# Patient Record
Sex: Female | Born: 1945 | Race: White | Hispanic: No | Marital: Single | Smoking: Never smoker
Health system: Southern US, Community
[De-identification: ages and names within clinical notes are randomized; demographics above are authoritative.]

## PROBLEM LIST (undated history)

## (undated) DIAGNOSIS — R0902 Hypoxemia: Secondary | ICD-10-CM

## (undated) DIAGNOSIS — G8929 Other chronic pain: Secondary | ICD-10-CM

## (undated) DIAGNOSIS — E041 Nontoxic single thyroid nodule: Secondary | ICD-10-CM

## (undated) DIAGNOSIS — K219 Gastro-esophageal reflux disease without esophagitis: Secondary | ICD-10-CM

## (undated) DIAGNOSIS — E873 Alkalosis: Secondary | ICD-10-CM

## (undated) DIAGNOSIS — J189 Pneumonia, unspecified organism: Secondary | ICD-10-CM

## (undated) DIAGNOSIS — J449 Chronic obstructive pulmonary disease, unspecified: Secondary | ICD-10-CM

## (undated) DIAGNOSIS — I5031 Acute diastolic (congestive) heart failure: Secondary | ICD-10-CM

## (undated) DIAGNOSIS — R911 Solitary pulmonary nodule: Secondary | ICD-10-CM

## (undated) DIAGNOSIS — I10 Essential (primary) hypertension: Secondary | ICD-10-CM

## (undated) DIAGNOSIS — G7100 Muscular dystrophy, unspecified: Secondary | ICD-10-CM

## (undated) DIAGNOSIS — J9811 Atelectasis: Secondary | ICD-10-CM

## (undated) DIAGNOSIS — E871 Hypo-osmolality and hyponatremia: Secondary | ICD-10-CM

## (undated) DIAGNOSIS — I509 Heart failure, unspecified: Secondary | ICD-10-CM

## (undated) DIAGNOSIS — J9612 Chronic respiratory failure with hypercapnia: Secondary | ICD-10-CM

## (undated) DIAGNOSIS — M6281 Muscle weakness (generalized): Secondary | ICD-10-CM

## (undated) HISTORY — DX: Gastro-esophageal reflux disease without esophagitis: K21.9

## (undated) HISTORY — PX: ABDOMINAL HYSTERECTOMY: SHX81

## (undated) HISTORY — PX: CHOLECYSTECTOMY: SHX55

---

## 2010-10-06 ENCOUNTER — Ambulatory Visit (HOSPITAL_COMMUNITY)
Admission: RE | Admit: 2010-10-06 | Discharge: 2010-10-06 | Disposition: A | Discharge status: Home / Self Care | Payer: Medicare Other | Source: Ambulatory Visit | Attending: Pulmonary Disease | Admitting: Pulmonary Disease

## 2010-10-06 ENCOUNTER — Other Ambulatory Visit (HOSPITAL_COMMUNITY): Payer: Self-pay | Admitting: Pulmonary Disease

## 2010-10-06 ENCOUNTER — Other Ambulatory Visit (HOSPITAL_COMMUNITY): Payer: Self-pay | Admitting: *Deleted

## 2010-10-06 DIAGNOSIS — R0602 Shortness of breath: Secondary | ICD-10-CM

## 2011-01-19 ENCOUNTER — Ambulatory Visit (HOSPITAL_COMMUNITY)
Admission: RE | Admit: 2011-01-19 | Discharge: 2011-01-19 | Disposition: A | Discharge status: Home / Self Care | Payer: Medicare Other | Source: Ambulatory Visit | Attending: Pulmonary Disease | Admitting: Pulmonary Disease

## 2011-01-19 ENCOUNTER — Other Ambulatory Visit (HOSPITAL_COMMUNITY): Payer: Self-pay | Admitting: Pulmonary Disease

## 2011-01-19 DIAGNOSIS — G35 Multiple sclerosis: Secondary | ICD-10-CM | POA: Insufficient documentation

## 2011-01-19 DIAGNOSIS — R52 Pain, unspecified: Secondary | ICD-10-CM

## 2011-01-19 DIAGNOSIS — M542 Cervicalgia: Secondary | ICD-10-CM | POA: Insufficient documentation

## 2011-06-22 ENCOUNTER — Other Ambulatory Visit: Payer: Self-pay | Admitting: Neurology

## 2011-06-22 ENCOUNTER — Ambulatory Visit (HOSPITAL_COMMUNITY)
Admission: RE | Admit: 2011-06-22 | Discharge: 2011-06-22 | Disposition: A | Discharge status: Home / Self Care | Payer: Medicare Other | Source: Ambulatory Visit | Attending: Neurology | Admitting: Neurology

## 2011-06-22 DIAGNOSIS — W19XXXA Unspecified fall, initial encounter: Secondary | ICD-10-CM

## 2011-06-22 DIAGNOSIS — M25569 Pain in unspecified knee: Secondary | ICD-10-CM | POA: Insufficient documentation

## 2011-06-22 DIAGNOSIS — M899 Disorder of bone, unspecified: Secondary | ICD-10-CM | POA: Insufficient documentation

## 2011-06-22 DIAGNOSIS — M79609 Pain in unspecified limb: Secondary | ICD-10-CM | POA: Insufficient documentation

## 2011-06-22 DIAGNOSIS — M949 Disorder of cartilage, unspecified: Secondary | ICD-10-CM | POA: Insufficient documentation

## 2011-07-04 ENCOUNTER — Ambulatory Visit: Payer: Medicare Other | Attending: Neurology | Admitting: Sleep Medicine

## 2011-07-04 DIAGNOSIS — Z6834 Body mass index (BMI) 34.0-34.9, adult: Secondary | ICD-10-CM | POA: Insufficient documentation

## 2011-07-04 DIAGNOSIS — G4710 Hypersomnia, unspecified: Secondary | ICD-10-CM | POA: Insufficient documentation

## 2011-07-04 DIAGNOSIS — G4730 Sleep apnea, unspecified: Secondary | ICD-10-CM

## 2011-07-10 NOTE — Procedures (Signed)
Karen Collier, KIGER                 ACCOUNT NO.:  [id number]  MEDICAL RECORD NO.:  [id number]          PATIENT TYPE:  OUT  LOCATION:  SLEEP LAB                     FACILITY:  [facility]  PHYSICIAN:  Almus Woodham A. Gerilyn Pilgrim, M.D. DATE OF BIRTH:  01/03/1946  DATE OF STUDY:  07/04/2011                           NOCTURNAL POLYSOMNOGRAM   REFERRING PHYSICIAN:  Asa Fath A. Lamiracle Chaidez, MD  INDICATION FOR STUDY:  A 66 year old presents with hypersomnia, fatigue, and snoring.  The study is being done to evaluate for obstructive sleep apnea syndrome.  EPWORTH SLEEPINESS SCORE:  Twelve.  BMI 34.  MEDICATIONS: 1. Coreg. 2. Diazepam. 3. Metolazone. 4. Lasix. 5. Meclizine. 6. Ventolin. 7. Soma. 8. Tramadol. 9. Calcium. 10.Multivitamin. 11.Tylenol Extra Strength.  SLEEP ARCHITECTURE:  The total recording time is 401 minutes.  Sleep efficiency is 65%.  Sleep latency 20 minutes.  REM latency 99 minutes. Stage N1 15.1%, N2 55.7%, N3 25%, and REM sleep 4%.  RESPIRATORY DATA:  Baseline oxygen saturation 98%, lowest saturation 93% during non-REM sleep.  Diagnostic AHI is 0.2 and RDI 1.4.   CARDIAC DATA:  Average heart rate is 82 with no significant dysrhythmias observed.  MOVEMENT-PARASOMNIA:  PLM index 29.  IMPRESSIONS-RECOMMENDATIONS:  Moderate limb movement disorder with sleep.     Elise Knobloch A. Gerilyn Pilgrim, M.D.    KAD/MEDQ  D:  07/10/2011 12:56:37  T:  07/10/2011 13:08:15  Job:  [id number]

## 2011-07-31 ENCOUNTER — Encounter (HOSPITAL_COMMUNITY): Payer: Self-pay | Admitting: *Deleted

## 2011-07-31 ENCOUNTER — Emergency Department (HOSPITAL_COMMUNITY)
Admission: EM | Admit: 2011-07-31 | Discharge: 2011-08-01 | Disposition: A | Discharge status: Other Acute Inpatient Hospital | Payer: Medicare Other | Attending: Emergency Medicine | Admitting: Emergency Medicine

## 2011-07-31 ENCOUNTER — Emergency Department (HOSPITAL_COMMUNITY): Payer: Medicare Other

## 2011-07-31 DIAGNOSIS — R10811 Right upper quadrant abdominal tenderness: Secondary | ICD-10-CM | POA: Insufficient documentation

## 2011-07-31 DIAGNOSIS — R112 Nausea with vomiting, unspecified: Secondary | ICD-10-CM | POA: Insufficient documentation

## 2011-07-31 DIAGNOSIS — R109 Unspecified abdominal pain: Secondary | ICD-10-CM | POA: Insufficient documentation

## 2011-07-31 DIAGNOSIS — R000 Tachycardia, unspecified: Secondary | ICD-10-CM | POA: Insufficient documentation

## 2011-07-31 DIAGNOSIS — H409 Unspecified glaucoma: Secondary | ICD-10-CM | POA: Insufficient documentation

## 2011-07-31 DIAGNOSIS — G7109 Other specified muscular dystrophies: Secondary | ICD-10-CM | POA: Insufficient documentation

## 2011-07-31 DIAGNOSIS — D72829 Elevated white blood cell count, unspecified: Secondary | ICD-10-CM | POA: Insufficient documentation

## 2011-07-31 DIAGNOSIS — I509 Heart failure, unspecified: Secondary | ICD-10-CM | POA: Insufficient documentation

## 2011-07-31 DIAGNOSIS — J45909 Unspecified asthma, uncomplicated: Secondary | ICD-10-CM | POA: Insufficient documentation

## 2011-07-31 DIAGNOSIS — Z9889 Other specified postprocedural states: Secondary | ICD-10-CM | POA: Insufficient documentation

## 2011-07-31 DIAGNOSIS — R42 Dizziness and giddiness: Secondary | ICD-10-CM | POA: Insufficient documentation

## 2011-07-31 DIAGNOSIS — Z9079 Acquired absence of other genital organ(s): Secondary | ICD-10-CM | POA: Insufficient documentation

## 2011-07-31 DIAGNOSIS — I10 Essential (primary) hypertension: Secondary | ICD-10-CM | POA: Insufficient documentation

## 2011-07-31 DIAGNOSIS — R197 Diarrhea, unspecified: Secondary | ICD-10-CM | POA: Insufficient documentation

## 2011-07-31 DIAGNOSIS — G8929 Other chronic pain: Secondary | ICD-10-CM | POA: Insufficient documentation

## 2011-07-31 HISTORY — DX: Other chronic pain: G89.29

## 2011-07-31 HISTORY — DX: Chronic obstructive pulmonary disease, unspecified: J44.9

## 2011-07-31 HISTORY — DX: Heart failure, unspecified: I50.9

## 2011-07-31 HISTORY — DX: Essential (primary) hypertension: I10

## 2011-07-31 HISTORY — DX: Hypoxemia: R09.02

## 2011-07-31 HISTORY — DX: Muscular dystrophy, unspecified: G71.00

## 2011-07-31 LAB — COMPREHENSIVE METABOLIC PANEL
Albumin: 3.7 g/dL (ref 3.5–5.2)
BUN: 9 mg/dL (ref 6–23)
Creatinine, Ser: 0.29 mg/dL — ABNORMAL LOW (ref 0.50–1.10)
GFR calc Af Amer: >90 mL/min (ref >90)
Glucose, Bld: 116 mg/dL — ABNORMAL HIGH (ref 70–99)
Total Protein: 7.8 g/dL (ref 6.0–8.3)

## 2011-07-31 LAB — DIFFERENTIAL
Basophils Absolute: 0 10*3/uL (ref 0.0–0.1)
Eosinophils Relative: 1 % (ref 0–5)
Lymphocytes Relative: 10 % — ABNORMAL LOW (ref 12–46)
Lymphs Abs: 1.1 10*3/uL (ref 0.7–4.0)
Monocytes Absolute: 0.5 10*3/uL (ref 0.1–1.0)
Neutro Abs: 9.6 10*3/uL — ABNORMAL HIGH (ref 1.7–7.7)

## 2011-07-31 LAB — URINALYSIS, ROUTINE W REFLEX MICROSCOPIC
Bilirubin Urine: NEGATIVE
Nitrite: NEGATIVE
Specific Gravity, Urine: <1.005 — ABNORMAL LOW (ref 1.005–1.030)
Urobilinogen, UA: 0.2 mg/dL (ref 0.0–1.0)
pH: 7 (ref 5.0–8.0)

## 2011-07-31 LAB — CBC
HCT: 38.5 % (ref 36.0–46.0)
MCV: 84.4 fL (ref 78.0–100.0)
Platelets: 213 10*3/uL (ref 150–400)
RBC: 4.56 MIL/uL (ref 3.87–5.11)
RDW: 14.5 % (ref 11.5–15.5)
WBC: 11.3 10*3/uL — ABNORMAL HIGH (ref 4.0–10.5)

## 2011-07-31 LAB — LIPASE, BLOOD: Lipase: 27 U/L (ref 11–59)

## 2011-07-31 MED ORDER — IOHEXOL 300 MG/ML  SOLN: 100.0000 mL | Freq: Once | INTRAMUSCULAR | Status: AC | PRN

## 2011-07-31 MED ORDER — IOHEXOL 300 MG/ML  SOLN: 40.0000 mL | Freq: Once | INTRAMUSCULAR | Status: AC | PRN

## 2011-07-31 MED ORDER — HYDROMORPHONE HCL PF 1 MG/ML IJ SOLN
0.5000 mg | Freq: Once | INTRAMUSCULAR | Status: AC
  Administered 2011-07-31: 0.5 mg via INTRAVENOUS
  Filled 2011-07-31: qty 1

## 2011-07-31 MED ORDER — ONDANSETRON HCL 4 MG/2ML IJ SOLN
4.0000 mg | Freq: Once | INTRAMUSCULAR | Status: AC
  Administered 2011-07-31: 4 mg via INTRAVENOUS
  Filled 2011-07-31: qty 2

## 2011-07-31 MED ORDER — POTASSIUM CHLORIDE 20 MEQ/15ML (10%) PO LIQD
60.0000 meq | Freq: Once | ORAL | Status: AC
  Administered 2011-07-31: 60 meq via ORAL
  Filled 2011-07-31: qty 60

## 2011-07-31 MED ORDER — SODIUM CHLORIDE 0.9 % IV BOLUS (SEPSIS)
1000.0000 mL | Freq: Once | INTRAVENOUS | Status: AC
  Administered 2011-07-31: 1000 mL via INTRAVENOUS

## 2011-07-31 NOTE — ED Provider Notes (Signed)
History   Scribed for Gerhard Munch, MD, the patient was seen in [facility]. The chart was scribed by Gilman Schmidt. The patients care was started at 6:55 PM.   CSN: [id number]  Arrival date & time 07/31/11  4098   First MD Initiated Contact with Patient 07/31/11 1816      Chief Complaint  Patient presents with  . Emesis    (Consider location/radiation/quality/duration/timing/severity/associated sxs/prior treatment) HPI Karen Collier is a 66 y.o. female who presents to the Emergency Department complaining of emesis. Pt notes dizziness, abdominal pain, nausea, vomiting, and diarrhea onset yesterday. States pain is currently at a 5-6. Pain is diffuse, sore.  Denies any leg swelling, fever, or blood in stool. Denies any new travel or new meds. Pt has not tried any OTC meds.   Past Medical History  Diagnosis Date  . Muscular dystrophy   . COPD (chronic obstructive pulmonary disease)   . Asthma   . Hypoxemia   . CHF (congestive heart failure)   . Chronic pain   . Hypertension   . Glaucoma     Past Surgical History  Procedure Date  . Unable   . Cholecystectomy   . Abdominal hysterectomy     No family history on file.  History  Substance Use Topics  . Smoking status: Not on file  . Smokeless tobacco: Not on file  . Alcohol Use: No    OB History    Grav Para Term Preterm Abortions TAB SAB Ect Mult Living                  Review of Systems  Constitutional: Negative for fever.  Cardiovascular: Negative for leg swelling.  Gastrointestinal: Positive for nausea, vomiting, abdominal pain and diarrhea. Negative for blood in stool.  Neurological: Positive for dizziness.  All other systems reviewed and are negative.    Allergies  Review of patient's allergies indicates no known allergies.  Home Medications  No current outpatient prescriptions on file.  BP 121/46  Pulse 113  Temp(Src) 99.2 F (37.3 C) (Oral)  Resp 16  Ht 5\' 6"  (1.676 m)  Wt 208 lb (94.348 kg)   BMI 33.57 kg/m2  SpO2 100%  Physical Exam  Constitutional: She appears well-developed and well-nourished.  HENT:  Head: Normocephalic and atraumatic.  Eyes: Conjunctivae are normal. Pupils are equal, round, and reactive to light.  Neck: Neck supple. No tracheal deviation present. No thyromegaly present.  Cardiovascular: Regular rhythm.  Tachycardia present.   No murmur heard. Pulmonary/Chest: Effort normal and breath sounds normal.  Abdominal: Soft. Bowel sounds are normal. She exhibits no distension. There is tenderness in the right upper quadrant.  Musculoskeletal: Normal range of motion. She exhibits no edema and no tenderness.  Neurological: She is alert. Coordination normal.  Skin: Skin is warm and dry. No rash noted.  Psychiatric: She has a normal mood and affect.    ED Course  Procedures (including critical care time)  Labs Reviewed - No data to display No results found.   No diagnosis found.  DIAGNOSTIC STUDIES: Oxygen Saturation is 100% on [place], normal by my interpretation.    LABS Results for orders placed during the hospital encounter of 07/31/11  COMPREHENSIVE METABOLIC PANEL      Component Value Range   Sodium 130 (*) 135 - 145 (mEq/L)   Potassium 2.4 (*) 3.5 - 5.1 (mEq/L)   Chloride 84 (*) 96 - 112 (mEq/L)   CO2 35 (*) 19 - 32 (mEq/L)   Glucose, Bld 116 (*)  70 - 99 (mg/dL)   BUN 9  6 - 23 (mg/dL)   Creatinine, Ser 1.61 (*) 0.50 - 1.10 (mg/dL)   Calcium 9.5  8.4 - 09.6 (mg/dL)   Total Protein 7.8  6.0 - 8.3 (g/dL)   Albumin 3.7  3.5 - 5.2 (g/dL)   AST 23  0 - 37 (U/L)   ALT 17  0 - 35 (U/L)   Alkaline Phosphatase 92  39 - 117 (U/L)   Total Bilirubin 0.5  0.3 - 1.2 (mg/dL)   GFR calc non Af Amer >90  >90 (mL/min)   GFR calc Af Amer >90  >90 (mL/min)  CBC      Component Value Range   WBC 11.3 (*) 4.0 - 10.5 (K/uL)   RBC 4.56  3.87 - 5.11 (MIL/uL)   Hemoglobin 13.2  12.0 - 15.0 (g/dL)   HCT 04.5  40.9 - 81.1 (%)   MCV 84.4  78.0 - 100.0 (fL)   MCH  28.9  26.0 - 34.0 (pg)   MCHC 34.3  30.0 - 36.0 (g/dL)   RDW 91.4  78.2 - 95.6 (%)   Platelets 213  150 - 400 (K/uL)  DIFFERENTIAL      Component Value Range   Neutrophils Relative 85 (*) 43 - 77 (%)   Neutro Abs 9.6 (*) 1.7 - 7.7 (K/uL)   Lymphocytes Relative 10 (*) 12 - 46 (%)   Lymphs Abs 1.1  0.7 - 4.0 (K/uL)   Monocytes Relative 4  3 - 12 (%)   Monocytes Absolute 0.5  0.1 - 1.0 (K/uL)   Eosinophils Relative 1  0 - 5 (%)   Eosinophils Absolute 0.1  0.0 - 0.7 (K/uL)   Basophils Relative 0  0 - 1 (%)   Basophils Absolute 0.0  0.0 - 0.1 (K/uL)  LIPASE, BLOOD      Component Value Range   Lipase 27  11 - 59 (U/L)  URINALYSIS, ROUTINE W REFLEX MICROSCOPIC      Component Value Range   Color, Urine YELLOW  YELLOW    APPearance CLEAR  CLEAR    Specific Gravity, Urine <1.005 (*) 1.005 - 1.030    pH 7.0  5.0 - 8.0    Glucose, UA NEGATIVE  NEGATIVE (mg/dL)   Hgb urine dipstick NEGATIVE  NEGATIVE    Bilirubin Urine NEGATIVE  NEGATIVE    Ketones, ur 15 (*) NEGATIVE (mg/dL)   Protein, ur NEGATIVE  NEGATIVE (mg/dL)   Urobilinogen, UA 0.2  0.0 - 1.0 (mg/dL)   Nitrite NEGATIVE  NEGATIVE    Leukocytes, UA NEGATIVE  NEGATIVE     Radiology: CT Abdomen Pelvis. Reviewed by me. IMPRESSION:  1. No acute abnormalities seen within the abdomen or pelvis. 2. 6 mm nonobstructing stone at the interpole region of the right kidney. 3. Mild bibasilar airspace opacities likely reflect atelectasis. 4. Mild coronary artery calcifications seen. 5. Scattered calcification along the abdominal aorta and its branches.  Original Report Authenticated By: Tonia Ghent, M.D.  COORDINATION OF CARE: 6:55pm:  - Patient evaluated by ED physician, Dilaudid, Zofran, CT Ab, CMP, CBC, Diff, Lipase, UA ordered     MDM  I personally performed the services described in this documentation, which was scribed in my presence. The recorded information has been reviewed and considered.  This nursing home residence  presents with diffuse abdominal discomfort and diarrhea.  Throughout the patient's emergency department stay she had no episodes of diarrhea, no emesis.  She was unremarkable on exam with  only mild discomfort diffusely.  The patient was afebrile, though she was mildly tachycardic.  Given the pain, change in bowel habits there is concern for diverticulitis.  CT did not demonstrate acute pathology.  Given the absence of significant symptoms while in the emergency department, the largely reassuring labs, mild leukocytosis noted, the patient was stable for return to her nursing facility.     Gerhard Munch, MD 07/31/11 [phone]

## 2011-07-31 NOTE — ED Notes (Signed)
Patient's sister called inquiring about patient being admitted or discharged. Advised sister patient is being sent back home per MD.

## 2011-07-31 NOTE — ED Notes (Signed)
Attempted to call report to [facility]. No answer.

## 2011-07-31 NOTE — ED Notes (Signed)
CRITICAL VALUE ALERT  Critical value received:  Potassium - 2.4  Date of notification:  07/31/2011  Time of notification:  2036  Critical value read back: yes  Nurse who received alert:  LJS  MD notified (1st page):  Jeraldine Loots  Time of first page:  2037  MD notified (2nd page):  Time of second page:  Responding MD:  Jeraldine Loots  Time MD responded: 2037

## 2011-07-31 NOTE — ED Notes (Signed)
Patient lying in bed resting. No complaints of pain at this time. No obvious distress noted.

## 2011-07-31 NOTE — ED Notes (Signed)
Resident of [facility]. N/V/D began this morning

## 2011-11-12 ENCOUNTER — Encounter (HOSPITAL_COMMUNITY): Payer: Self-pay | Admitting: Emergency Medicine

## 2011-11-12 ENCOUNTER — Emergency Department (HOSPITAL_COMMUNITY): Payer: Medicare Other

## 2011-11-12 ENCOUNTER — Inpatient Hospital Stay (HOSPITAL_COMMUNITY)
Admission: EM | Admit: 2011-11-12 | Discharge: 2011-11-16 | DRG: 190 | Payer: Medicare Other | Attending: Internal Medicine | Admitting: Internal Medicine

## 2011-11-12 DIAGNOSIS — I509 Heart failure, unspecified: Secondary | ICD-10-CM | POA: Diagnosis present

## 2011-11-12 DIAGNOSIS — J449 Chronic obstructive pulmonary disease, unspecified: Secondary | ICD-10-CM

## 2011-11-12 DIAGNOSIS — G7100 Muscular dystrophy, unspecified: Secondary | ICD-10-CM

## 2011-11-12 DIAGNOSIS — R0689 Other abnormalities of breathing: Secondary | ICD-10-CM | POA: Diagnosis present

## 2011-11-12 DIAGNOSIS — R7309 Other abnormal glucose: Secondary | ICD-10-CM | POA: Diagnosis present

## 2011-11-12 DIAGNOSIS — J441 Chronic obstructive pulmonary disease with (acute) exacerbation: Secondary | ICD-10-CM

## 2011-11-12 DIAGNOSIS — I10 Essential (primary) hypertension: Secondary | ICD-10-CM | POA: Diagnosis present

## 2011-11-12 DIAGNOSIS — Z9981 Dependence on supplemental oxygen: Secondary | ICD-10-CM

## 2011-11-12 DIAGNOSIS — J9620 Acute and chronic respiratory failure, unspecified whether with hypoxia or hypercapnia: Secondary | ICD-10-CM

## 2011-11-12 DIAGNOSIS — Z79899 Other long term (current) drug therapy: Secondary | ICD-10-CM

## 2011-11-12 DIAGNOSIS — J209 Acute bronchitis, unspecified: Principal | ICD-10-CM | POA: Diagnosis present

## 2011-11-12 DIAGNOSIS — T380X5A Adverse effect of glucocorticoids and synthetic analogues, initial encounter: Secondary | ICD-10-CM | POA: Diagnosis present

## 2011-11-12 DIAGNOSIS — G7109 Other specified muscular dystrophies: Secondary | ICD-10-CM | POA: Diagnosis present

## 2011-11-12 DIAGNOSIS — H409 Unspecified glaucoma: Secondary | ICD-10-CM | POA: Diagnosis present

## 2011-11-12 DIAGNOSIS — R51 Headache: Secondary | ICD-10-CM | POA: Diagnosis present

## 2011-11-12 DIAGNOSIS — E871 Hypo-osmolality and hyponatremia: Secondary | ICD-10-CM | POA: Diagnosis present

## 2011-11-12 DIAGNOSIS — E876 Hypokalemia: Secondary | ICD-10-CM | POA: Diagnosis present

## 2011-11-12 DIAGNOSIS — J440 Chronic obstructive pulmonary disease with acute lower respiratory infection: Secondary | ICD-10-CM

## 2011-11-12 DIAGNOSIS — J45901 Unspecified asthma with (acute) exacerbation: Secondary | ICD-10-CM | POA: Diagnosis present

## 2011-11-12 HISTORY — DX: Muscle weakness (generalized): M62.81

## 2011-11-12 LAB — BLOOD GAS, ARTERIAL
Acid-Base Excess: 15 mmol/L — ABNORMAL HIGH (ref 0.0–2.0)
O2 Content: 3 L/min
O2 Saturation: 92.1 %
Patient temperature: 37
TCO2: 37.5 mmol/L (ref 0–100)

## 2011-11-12 LAB — COMPREHENSIVE METABOLIC PANEL
ALT: 14 U/L (ref 0–35)
AST: 21 U/L (ref 0–37)
Alkaline Phosphatase: 93 U/L (ref 39–117)
CO2: 39 mEq/L — ABNORMAL HIGH (ref 19–32)
Calcium: 9.4 mg/dL (ref 8.4–10.5)
Chloride: 79 mEq/L — ABNORMAL LOW (ref 96–112)
GFR calc non Af Amer: >90 mL/min (ref >90)
Glucose, Bld: 153 mg/dL — ABNORMAL HIGH (ref 70–99)
Potassium: 3.2 mEq/L — ABNORMAL LOW (ref 3.5–5.1)
Sodium: 125 mEq/L — ABNORMAL LOW (ref 135–145)

## 2011-11-12 LAB — CBC WITH DIFFERENTIAL/PLATELET
Eosinophils Absolute: 0.3 10*3/uL (ref 0.0–0.7)
Hemoglobin: 12.3 g/dL (ref 12.0–15.0)
Lymphocytes Relative: 9 % — ABNORMAL LOW (ref 12–46)
Lymphs Abs: 0.9 10*3/uL (ref 0.7–4.0)
Monocytes Relative: 5 % (ref 3–12)
Neutro Abs: 8.4 10*3/uL — ABNORMAL HIGH (ref 1.7–7.7)
Neutrophils Relative %: 83 % — ABNORMAL HIGH (ref 43–77)
Platelets: 173 10*3/uL (ref 150–400)
RBC: 4.29 MIL/uL (ref 3.87–5.11)
WBC: 10.2 10*3/uL (ref 4.0–10.5)

## 2011-11-12 LAB — PRO B NATRIURETIC PEPTIDE: Pro B Natriuretic peptide (BNP): 109.1 pg/mL (ref 0–125)

## 2011-11-12 MED ORDER — METHYLPREDNISOLONE SODIUM SUCC 125 MG IJ SOLR
125.0000 mg | Freq: Once | INTRAMUSCULAR | Status: DC
  Filled 2011-11-12: qty 2

## 2011-11-12 MED ORDER — ACETAMINOPHEN 325 MG PO TABS
650.0000 mg | ORAL_TABLET | Freq: Four times a day (QID) | ORAL | Status: DC | PRN
  Administered 2011-11-15: 650 mg via ORAL
  Filled 2011-11-12: qty 2

## 2011-11-12 MED ORDER — SODIUM CHLORIDE 0.9 % IJ SOLN
3.0000 mL | Freq: Two times a day (BID) | INTRAMUSCULAR | Status: DC
  Administered 2011-11-13 – 2011-11-16 (×6): 3 mL via INTRAVENOUS
  Filled 2011-11-12 (×6): qty 3

## 2011-11-12 MED ORDER — ONDANSETRON HCL 4 MG/2ML IJ SOLN: 4.0000 mg | INTRAMUSCULAR | Status: DC | PRN

## 2011-11-12 MED ORDER — MECLIZINE HCL 12.5 MG PO TABS
25.0000 mg | ORAL_TABLET | Freq: Every day | ORAL | Status: DC
  Administered 2011-11-13 – 2011-11-16 (×4): 25 mg via ORAL
  Filled 2011-11-12 (×4): qty 2

## 2011-11-12 MED ORDER — METHYLPREDNISOLONE SODIUM SUCC 125 MG IJ SOLR
125.0000 mg | Freq: Four times a day (QID) | INTRAMUSCULAR | Status: DC
  Administered 2011-11-13 (×2): 125 mg via INTRAVENOUS
  Filled 2011-11-12 (×2): qty 2

## 2011-11-12 MED ORDER — TRAMADOL HCL 50 MG PO TABS: 50.0000 mg | ORAL_TABLET | Freq: Four times a day (QID) | ORAL | Status: DC | PRN

## 2011-11-12 MED ORDER — DOCUSATE SODIUM 100 MG PO CAPS
100.0000 mg | ORAL_CAPSULE | Freq: Two times a day (BID) | ORAL | Status: DC
  Administered 2011-11-13 – 2011-11-16 (×4): 100 mg via ORAL
  Filled 2011-11-12 (×5): qty 1

## 2011-11-12 MED ORDER — LORAZEPAM 1 MG PO TABS
1.0000 mg | ORAL_TABLET | Freq: Three times a day (TID) | ORAL | Status: DC
  Administered 2011-11-13 – 2011-11-16 (×11): 1 mg via ORAL
  Filled 2011-11-12 (×2): qty 1
  Filled 2011-11-12 (×4): qty 2
  Filled 2011-11-12: qty 1
  Filled 2011-11-12 (×4): qty 2

## 2011-11-12 MED ORDER — IPRATROPIUM BROMIDE 0.02 % IN SOLN
0.5000 mg | Freq: Once | RESPIRATORY_TRACT | Status: AC
  Administered 2011-11-12: 0.5 mg via RESPIRATORY_TRACT
  Filled 2011-11-12: qty 2.5

## 2011-11-12 MED ORDER — GUAIFENESIN ER 600 MG PO TB12
1200.0000 mg | ORAL_TABLET | Freq: Two times a day (BID) | ORAL | Status: DC
  Administered 2011-11-13 – 2011-11-16 (×8): 1200 mg via ORAL
  Filled 2011-11-12 (×8): qty 2

## 2011-11-12 MED ORDER — CARVEDILOL 12.5 MG PO TABS
12.5000 mg | ORAL_TABLET | Freq: Two times a day (BID) | ORAL | Status: DC
  Administered 2011-11-13 – 2011-11-16 (×7): 12.5 mg via ORAL
  Filled 2011-11-12 (×7): qty 1

## 2011-11-12 MED ORDER — LATANOPROST 0.005 % OP SOLN
1.0000 [drp] | Freq: Every day | OPHTHALMIC | Status: DC
  Administered 2011-11-13 – 2011-11-14 (×2): 1 [drp] via OPHTHALMIC
  Filled 2011-11-12: qty 2.5

## 2011-11-12 MED ORDER — BACLOFEN 10 MG PO TABS
10.0000 mg | ORAL_TABLET | Freq: Two times a day (BID) | ORAL | Status: DC
  Administered 2011-11-13 – 2011-11-16 (×8): 10 mg via ORAL
  Filled 2011-11-12 (×14): qty 1

## 2011-11-12 MED ORDER — IPRATROPIUM BROMIDE 0.02 % IN SOLN
RESPIRATORY_TRACT | Status: AC
  Administered 2011-11-12: 0.5 mg
  Filled 2011-11-12: qty 2.5

## 2011-11-12 MED ORDER — ALBUTEROL SULFATE (5 MG/ML) 0.5% IN NEBU
5.0000 mg | INHALATION_SOLUTION | Freq: Once | RESPIRATORY_TRACT | Status: AC
  Administered 2011-11-12: 5 mg via RESPIRATORY_TRACT
  Filled 2011-11-12: qty 1

## 2011-11-12 MED ORDER — ALBUTEROL SULFATE (5 MG/ML) 0.5% IN NEBU
2.5000 mg | INHALATION_SOLUTION | RESPIRATORY_TRACT | Status: DC
  Administered 2011-11-13 – 2011-11-16 (×16): 2.5 mg via RESPIRATORY_TRACT
  Filled 2011-11-12 (×16): qty 0.5

## 2011-11-12 MED ORDER — HYDROMORPHONE HCL PF 1 MG/ML IJ SOLN: 0.5000 mg | INTRAMUSCULAR | Status: DC | PRN

## 2011-11-12 MED ORDER — METHYLPREDNISOLONE SODIUM SUCC 125 MG IJ SOLR
125.0000 mg | Freq: Once | INTRAMUSCULAR | Status: AC
  Administered 2011-11-12: 125 mg via INTRAVENOUS

## 2011-11-12 MED ORDER — ACETAMINOPHEN 650 MG RE SUPP: 650.0000 mg | Freq: Four times a day (QID) | RECTAL | Status: DC | PRN

## 2011-11-12 MED ORDER — ALBUTEROL SULFATE (5 MG/ML) 0.5% IN NEBU
INHALATION_SOLUTION | RESPIRATORY_TRACT | Status: AC
  Administered 2011-11-12: 5 mg
  Filled 2011-11-12: qty 1

## 2011-11-12 MED ORDER — SIMVASTATIN 20 MG PO TABS
20.0000 mg | ORAL_TABLET | Freq: Every day | ORAL | Status: DC
  Administered 2011-11-13 – 2011-11-15 (×3): 20 mg via ORAL
  Filled 2011-11-12 (×3): qty 1

## 2011-11-12 MED ORDER — ENOXAPARIN SODIUM 40 MG/0.4ML ~~LOC~~ SOLN
40.0000 mg | SUBCUTANEOUS | Status: DC
  Administered 2011-11-13 – 2011-11-16 (×4): 40 mg via SUBCUTANEOUS
  Filled 2011-11-12 (×4): qty 0.4

## 2011-11-12 MED ORDER — CARISOPRODOL 350 MG PO TABS
350.0000 mg | ORAL_TABLET | Freq: Three times a day (TID) | ORAL | Status: DC
  Administered 2011-11-13 – 2011-11-16 (×11): 350 mg via ORAL
  Filled 2011-11-12 (×11): qty 1

## 2011-11-12 MED ORDER — IPRATROPIUM BROMIDE 0.02 % IN SOLN
0.5000 mg | RESPIRATORY_TRACT | Status: DC
  Administered 2011-11-13 – 2011-11-16 (×16): 0.5 mg via RESPIRATORY_TRACT
  Filled 2011-11-12 (×16): qty 2.5

## 2011-11-12 NOTE — ED Notes (Signed)
Attempted to call report; ICU waiting staff to come in, Lowella Bandy will be the RN to receive pt; ICU to return call back to receive report

## 2011-11-12 NOTE — H&P (Signed)
PCP:  Dwana Melena, MD  Pulmonologist: Kari Baars M.D.   Chief Complaint:  Shortness of breath for the past few days  HPI: Karen Collier is an 66 y.o. female.   Middle-aged Caucasian lady, resident of a [facility] long [facility], history of COPD on home O2, and limb girdle muscular dystrophy, has been having cough and increasing shortness of breath for the past 2 days. Today the cough has become productive of white sputum, and her home nebulizers continued to fail to give relief, after her oxygen saturation was found to be in the 50s at the facility he was brought to the emergency room for further evaluation and management.  She denies fever headache or chest pain, or leg swelling or pains, but has required high flow oxygen and serial nebulizations to maintain her O2 sats in the emergency room. Hospitalist service was called to assist.  At baseline she is mobile mostly by wheelchair, but can do minimal walking with a thickened support.  She does have a history of CHF unknown type, but denies dietary indiscretion.   Rewiew of Systems:  The patient denies anorexia, fever, weight loss,, vision loss, decreased hearing, hoarseness, chest pain, syncope,  peripheral edema, balance deficits, hemoptysis, abdominal pain, melena, hematochezia, severe indigestion/heartburn, hematuria, incontinence, genital sores, muscle weakness, suspicious skin lesions, transient blindness, difficulty walking, depression, unusual weight change, abnormal bleeding, enlarged lymph nodes, angioedema, and breast masses.    Past Medical History  Diagnosis Date  . Muscular dystrophy   . COPD (chronic obstructive pulmonary disease)   . Asthma   . Hypoxemia   . CHF (congestive heart failure)   . Chronic pain   . Hypertension   . Glaucoma   . Muscle weakness     Past Surgical History  Procedure Date  . Unable   . Cholecystectomy   . Abdominal hysterectomy     Medications:  HOME MEDS: Prior to Admission  medications   Medication Sig Start Date End Date Taking? Authorizing Provider  albuterol (PROVENTIL) (2.5 MG/3ML) 0.083% nebulizer solution Take 2.5 mg by nebulization 3 (three) times daily.   Yes Historical Provider, MD  baclofen (LIORESAL) 10 MG tablet Take 10 mg by mouth 2 (two) times daily.   Yes Historical Provider, MD  Calcium Carbonate-Vitamin D (CALCIUM 600 + D PO) Take 1 tablet by mouth daily.   Yes Historical Provider, MD  carisoprodol (SOMA) 350 MG tablet Take 350 mg by mouth every 8 (eight) hours.    Yes Historical Provider, MD  carvedilol (COREG) 12.5 MG tablet Take 12.5 mg by mouth 2 (two) times daily.   Yes Historical Provider, MD  clotrimazole (LOTRIMIN) 1 % cream Apply 1 application topically 2 (two) times daily.   Yes Historical Provider, MD  docusate sodium (COLACE) 100 MG capsule Take 100 mg by mouth 2 (two) times daily.   Yes Historical Provider, MD  furosemide (LASIX) 20 MG tablet Take 20 mg by mouth daily.   Yes Historical Provider, MD  latanoprost (XALATAN) 0.005 % ophthalmic solution Place 1 drop into both eyes at bedtime.   Yes Historical Provider, MD  LORazepam (ATIVAN) 1 MG tablet Take 1 mg by mouth 3 (three) times daily.   Yes Historical Provider, MD  meclizine (ANTIVERT) 25 MG tablet Take 25 mg by mouth daily.   Yes Historical Provider, MD  metolazone (ZAROXOLYN) 5 MG tablet Take 5 mg by mouth every other day.   Yes Historical Provider, MD  Multiple Vitamin (DAILY VITE) TABS Take 1 tablet  by mouth daily.   Yes Historical Provider, MD  potassium chloride SA (K-DUR,KLOR-CON) 20 MEQ tablet Take 20 mEq by mouth 2 (two) times daily.   Yes Historical Provider, MD  Probiotic Product (ALIGN) 4 MG CAPS Take 1 capsule by mouth daily.   Yes Historical Provider, MD  simvastatin (ZOCOR) 20 MG tablet Take 20 mg by mouth at bedtime.   Yes Historical Provider, MD  traMADol (ULTRAM) 50 MG tablet Take 50-100 mg by mouth every 6 (six) hours as needed. pain   Yes Historical Provider, MD       Allergies:  No Known Allergies  Social History:   does not have a smoking history on file. She does not have any smokeless tobacco history on file. She reports that she does not drink alcohol. Her drug history not on file.  Family History: History reviewed. No pertinent family history.   Physical Exam: Filed Vitals:   11/12/11 2014 11/12/11 2046 11/12/11 2058 11/12/11 2140  BP:    133/62  Pulse:    97  Temp:      TempSrc:      Resp:    24  Height:      Weight:      SpO2: 94% 91% 90% 90%   Blood pressure 133/62, pulse 97, temperature 98.7 F (37.1 C), temperature source Oral, resp. rate 24, height 5\' 10"  (1.778 m), weight 93.441 kg (206 lb), SpO2 90.00%.  GEN:  Pleasant  elderly Caucasian lady reclining in the stretcher  tachypnea on 8 L; receiving nebs ; cooperative with exam PSYCH:  alert and oriented x4; does not appear anxious or depressed; affect is appropriate. HEENT: Mucous membranes pink and anicteric; PERRLA; EOM intact; no cervical lymphadenopathy nor thyromegaly or carotid bruit; no JVD; Breasts:: Not examined CHEST WALL: No tenderness CHEST:  tachypnea   high-pitched rhonchi bilaterally HEART: Regular rate and rhythm; no murmurs rubs or gallops BACK:  no CVA tenderness ABDOMEN: Obese, soft non-tender; no masses, no organomegaly, normal abdominal bowel sounds; no pannus;  Rectal Exam: Not done EXTREMITIES: Wasting of the muscles around the shoulder; age-appropriate arthropathy of the hands and knees; no edema; no ulcerations. Genitalia: not examined PULSES: 2+ and symmetric SKIN: Normal hydration no rash or ulceration CNS: Cranial nerves 2-12 grossly intact no focal lateralizing neurologic deficit; generalized weakness; gait was not.   Labs & Imaging Results for orders placed during the hospital encounter of 11/12/11 (from the past 48 hour(s))  CBC WITH DIFFERENTIAL     Status: Abnormal   Collection Time   11/12/11  7:53 PM      Component Value Range  Comment   WBC 10.2  4.0 - 10.5 K/uL    RBC 4.29  3.87 - 5.11 MIL/uL    Hemoglobin 12.3  12.0 - 15.0 g/dL    HCT 96.0  45.4 - 09.8 %    MCV 85.8  78.0 - 100.0 fL    MCH 28.7  26.0 - 34.0 pg    MCHC 33.4  30.0 - 36.0 g/dL    RDW 11.9  14.7 - 82.9 %    Platelets 173  150 - 400 K/uL    Neutrophils Relative 83 (*) 43 - 77 %    Neutro Abs 8.4 (*) 1.7 - 7.7 K/uL    Lymphocytes Relative 9 (*) 12 - 46 %    Lymphs Abs 0.9  0.7 - 4.0 K/uL    Monocytes Relative 5  3 - 12 %    Monocytes Absolute 0.6  0.1 - 1.0 K/uL    Eosinophils Relative 3  0 - 5 %    Eosinophils Absolute 0.3  0.0 - 0.7 K/uL    Basophils Relative 0  0 - 1 %    Basophils Absolute 0.0  0.0 - 0.1 K/uL   PRO B NATRIURETIC PEPTIDE     Status: Normal   Collection Time   11/12/11  7:53 PM      Component Value Range Comment   Pro B Natriuretic peptide (BNP) 109.1  0 - 125 pg/mL   COMPREHENSIVE METABOLIC PANEL     Status: Abnormal   Collection Time   11/12/11  7:53 PM      Component Value Range Comment   Sodium 125 (*) 135 - 145 mEq/L    Potassium 3.2 (*) 3.5 - 5.1 mEq/L    Chloride 79 (*) 96 - 112 mEq/L    CO2 39 (*) 19 - 32 mEq/L    Glucose, Bld 153 (*) 70 - 99 mg/dL    BUN 6  6 - 23 mg/dL    Creatinine, Ser 1.61 (*) 0.50 - 1.10 mg/dL    Calcium 9.4  8.4 - 09.6 mg/dL    Total Protein 7.6  6.0 - 8.3 g/dL    Albumin 3.4 (*) 3.5 - 5.2 g/dL    AST 21  0 - 37 U/L    ALT 14  0 - 35 U/L    Alkaline Phosphatase 93  39 - 117 U/L    Total Bilirubin 0.3  0.3 - 1.2 mg/dL    GFR calc non Af Amer >90  >90 mL/min    GFR calc Af Amer >90  >90 mL/min   TROPONIN I     Status: Normal   Collection Time   11/12/11  7:53 PM      Component Value Range Comment   Troponin I <0.30  <0.30 ng/mL   BLOOD GAS, ARTERIAL     Status: Abnormal   Collection Time   11/12/11  8:43 PM      Component Value Range Comment   O2 Content 3.0      Delivery systems NASAL CANNULA      pH, Arterial 7.361  7.350 - 7.400    pCO2 arterial 74.3 (*) 35.0 - 45.0 mmHg      pO2, Arterial 67.4 (*) 80.0 - 100.0 mmHg    Bicarbonate 41.1 (*) 20.0 - 24.0 mEq/L    TCO2 37.5  0 - 100 mmol/L    Acid-Base Excess 15.0 (*) 0.0 - 2.0 mmol/L    O2 Saturation 92.1      Patient temperature 37.0      Collection site RIGHT RADIAL      Drawn by COLLECTED BY RT      Sample type ARTERIAL      Allens test (pass/fail) PASS  PASS    Dg Chest Port 1 View  11/12/2011  *RADIOLOGY REPORT*  Clinical Data: Severe shortness of breath for past 2 days.  PORTABLE CHEST - 1 VIEW  Comparison: Chest x-ray 10/06/2010.  Findings: Lung volumes are very low.  Again noted is gaseous distension of the stomach and mild elevation of the left hemidiaphragm.  There are bibasilar opacities, favored to represent areas of bibasilar subsegmental atelectasis or scarring.  No definite consolidative airspace disease.  No definite pleural effusions.  Cardiac silhouette is nearly completely obscured. Mediastinal contours are unremarkable.  IMPRESSION: 1.  Low volume chest with probable bibasilar subsegmental atelectasis, and no definite radiographic  evidence of acute cardiopulmonary disease.  Original Report Authenticated By: Florencia Reasons, M.D.      Assessment Present on Admission:  .Acute-on-chronic respiratory failure  .COPD (chronic obstructive pulmonary disease) with acute bronchitis .Muscular dystrophy .CHF (congestive heart failure) Hyponatremia Hypokalemia    PLAN:  admit this lady to step down for respiratory support, serial nebs, and around-the-clock steroids. She does not show overt signs of infection but will cover with Zithromax and COPD exacerbation   hyponatremia and hypokalemia probably related to CHF and its treatment; since she'll be losing excess fluid with tachypnea, we will hold her Lasix, metolazone  but will not hydrate given her history of brittle CHF.   Tajha Sammarco 11/12/2011, 10:22 PM

## 2011-11-12 NOTE — ED Notes (Signed)
Pt sats in 50's on arrival to ED via EMS; pt was not on 02 at the time; per report by EMS pt on continuous 02 at 2L; unable to get sats greater than 70's on [place] pt placed on 100% non rebreather mask; sats now in upper 90's. Pt tolerated well with no acute sx of resp failure at this time. No complaints of CP c/o neck neck pain

## 2011-11-12 NOTE — ED Notes (Addendum)
Pt titrated down to 4L sats remaining at 92%; pt also c/o of headache

## 2011-11-12 NOTE — ED Notes (Signed)
02 sats 87-89% on 2L increased 02 to 2.5L sats increased to 92%; pt encouraged to deep breath. Pt comfortable; informed pt of room number and explained procedure of transfer. Pt comfortable at this time

## 2011-11-12 NOTE — ED Notes (Signed)
Patient from [facility], complaining of difficulty breathing and sore throat. Patient O2 sats 58% on room air upon arrival. Placed patient on 2L O2, increased to 83%. Placed patient back on non-rebreather from EMS and increased to 94%.

## 2011-11-12 NOTE — ED Provider Notes (Signed)
History   This chart was scribed for Benny Lennert, MD by Charolett Bumpers . The patient was seen in room [facility]10/[facility]10.    CSN: [id number]  Arrival date & time 11/12/11  1914   First MD Initiated Contact with Patient 11/12/11 1923      Chief Complaint  Patient presents with  . Respiratory Distress  . Sore Throat    (Consider location/radiation/quality/duration/timing/severity/associated sxs/prior treatment) HPI Comments: Karen Collier is a 66 y.o. female who presents to the Emergency Department complaining of constant, moderate SOB with associated cough, sore throat, and headache with an onset of earlier today. Patient reports that her cough has been productive with white phlegm. Patient states that her SOB is aggravated with her cough. Patient states that she is on 2 L of 02 at home. Patient has a h/o COPD  PCP: Dr. Margo Aye Pulmonary: Dr. Juanetta Gosling  Patient is a 66 y.o. female presenting with shortness of breath. The history is provided by the patient.  Shortness of Breath  The current episode started today. The onset was gradual. The problem occurs frequently. The problem has been gradually worsening. The problem is moderate. Nothing relieves the symptoms. Associated symptoms include sore throat, cough and shortness of breath. Pertinent negatives include no fever. There were no sick contacts.    Past Medical History  Diagnosis Date  . Muscular dystrophy   . COPD (chronic obstructive pulmonary disease)   . Asthma   . Hypoxemia   . CHF (congestive heart failure)   . Chronic pain   . Hypertension   . Glaucoma   . Muscle weakness     Past Surgical History  Procedure Date  . Unable   . Cholecystectomy   . Abdominal hysterectomy     History reviewed. No pertinent family history.  History  Substance Use Topics  . Smoking status: Not on file  . Smokeless tobacco: Not on file  . Alcohol Use: No    OB History    Grav Para Term Preterm Abortions TAB SAB Ect Mult  Living                  Review of Systems  Constitutional: Negative for fever.  HENT: Positive for sore throat.   Respiratory: Positive for cough and shortness of breath.   Gastrointestinal: Negative for abdominal pain.  Neurological: Positive for headaches.  All other systems reviewed and are negative.    Allergies  Review of patient's allergies indicates no known allergies.  Home Medications   Current Outpatient Rx  Name Route Sig Dispense Refill  . ALBUTEROL SULFATE (2.5 MG/3ML) 0.083% IN NEBU Nebulization Take 2.5 mg by nebulization 3 (three) times daily.    Marland Kitchen CALCIUM 600 + D PO Oral Take 1 tablet by mouth daily.    Marland Kitchen CARISOPRODOL 350 MG PO TABS Oral Take 350 mg by mouth 3 (three) times daily.    Marland Kitchen CARVEDILOL 12.5 MG PO TABS Oral Take 12.5 mg by mouth 2 (two) times daily.    Marland Kitchen CLOTRIMAZOLE 1 % EX CREA Topical Apply 1 application topically 2 (two) times daily.    . FUROSEMIDE 20 MG PO TABS Oral Take 20 mg by mouth daily.    Marland Kitchen LATANOPROST 0.005 % OP SOLN Both Eyes Place 1 drop into both eyes at bedtime.    Marland Kitchen LORAZEPAM 1 MG PO TABS Oral Take 1 mg by mouth 3 (three) times daily.    Marland Kitchen MECLIZINE HCL 25 MG PO TABS Oral Take 25 mg by  mouth daily.    Marland Kitchen METOLAZONE 5 MG PO TABS Oral Take 5 mg by mouth every other day.    Marland Kitchen DAILY VITE PO TABS Oral Take 1 tablet by mouth daily.    Marland Kitchen POTASSIUM CHLORIDE CRYS ER 20 MEQ PO TBCR Oral Take 20 mEq by mouth 2 (two) times daily.    Marland Kitchen SIMVASTATIN 10 MG PO TABS Oral Take 20 mg by mouth at bedtime.    . TRAMADOL HCL 50 MG PO TABS Oral Take 50-100 mg by mouth every 6 (six) hours as needed. pain      BP 182/95  Pulse 109  Temp 98.7 F (37.1 C) (Oral)  Resp 26  Ht 5\' 10"  (1.778 m)  Wt 206 lb (93.441 kg)  BMI 29.56 kg/m2  SpO2 94%  Physical Exam  Constitutional: She is oriented to person, place, and time. She appears well-developed.  HENT:  Head: Normocephalic and atraumatic.  Eyes: Conjunctivae and EOM are normal. No scleral icterus.    Neck: Neck supple. No thyromegaly present.  Cardiovascular: Regular rhythm.  Tachycardia present.  Exam reveals no gallop and no friction rub.   No murmur heard. Pulmonary/Chest: No stridor. She has wheezes. She has no rales. She exhibits no tenderness.       Minimal wheezes bilaterally. Decreased breath sounds.   Abdominal: She exhibits no distension. There is no tenderness. There is no rebound.  Musculoskeletal: Normal range of motion. She exhibits no edema.  Lymphadenopathy:    She has no cervical adenopathy.  Neurological: She is alert and oriented to person, place, and time. Coordination normal.       Profound weakness in lower extremities that is consistent with chronic muscular dystropy.   Skin: No rash noted. No erythema.  Psychiatric: She has a normal mood and affect. Her behavior is normal.    ED Course  Procedures (including critical care time)  DIAGNOSTIC STUDIES: Oxygen Saturation is 94% on 2L [place], adequate by my interpretation.    COORDINATION OF CARE:  1940: Discussed planned course of treatment with the patient, who is agreeable at this time.  1945: Medication Orders: Methylprednisolone sodium succinate (Solu-medrol) 125 mg/2 mL injection 125 mg-once 2015: Medication Orders: Methylprednisolone sodium succinate (Solu-medrol) 125 mg/2 mL injection 125 mg-once 2027: Recheck: Lungs sounded improved upon re-examination after breathing treatment.    Labs Reviewed - No data to display No results found.   No diagnosis found.    Date: 11/12/2011  Rate:101  Rhythm: normal sinus rhythm  QRS Axis: normal  Intervals: normal  ST/T Wave abnormalities: normal  Conduction Disutrbances:none  Narrative Interpretation:   Old EKG Reviewed: none available   MDM    The chart was scribed for me under my direct supervision.  I personally performed the history, physical, and medical decision making and all procedures in the evaluation of this patient.Benny Lennert, MD 11/12/11 2124

## 2011-11-13 ENCOUNTER — Encounter (HOSPITAL_COMMUNITY): Payer: Self-pay | Admitting: *Deleted

## 2011-11-13 DIAGNOSIS — E871 Hypo-osmolality and hyponatremia: Secondary | ICD-10-CM | POA: Diagnosis present

## 2011-11-13 DIAGNOSIS — J9620 Acute and chronic respiratory failure, unspecified whether with hypoxia or hypercapnia: Secondary | ICD-10-CM

## 2011-11-13 DIAGNOSIS — E876 Hypokalemia: Secondary | ICD-10-CM | POA: Diagnosis present

## 2011-11-13 DIAGNOSIS — I10 Essential (primary) hypertension: Secondary | ICD-10-CM

## 2011-11-13 DIAGNOSIS — R0689 Other abnormalities of breathing: Secondary | ICD-10-CM | POA: Diagnosis present

## 2011-11-13 DIAGNOSIS — J441 Chronic obstructive pulmonary disease with (acute) exacerbation: Secondary | ICD-10-CM

## 2011-11-13 LAB — GLUCOSE, CAPILLARY: Glucose-Capillary: 143 mg/dL — ABNORMAL HIGH (ref 70–99)

## 2011-11-13 LAB — BASIC METABOLIC PANEL
BUN: 5 mg/dL — ABNORMAL LOW (ref 6–23)
CO2: 39 mEq/L — ABNORMAL HIGH (ref 19–32)
Calcium: 9.5 mg/dL (ref 8.4–10.5)
Creatinine, Ser: 0.2 mg/dL — ABNORMAL LOW (ref 0.50–1.10)
Glucose, Bld: 163 mg/dL — ABNORMAL HIGH (ref 70–99)

## 2011-11-13 LAB — CBC
MCH: 28.2 pg (ref 26.0–34.0)
MCV: 85.2 fL (ref 78.0–100.0)
Platelets: 152 10*3/uL (ref 150–400)
RBC: 4.25 MIL/uL (ref 3.87–5.11)
RDW: 14.4 % (ref 11.5–15.5)

## 2011-11-13 MED ORDER — INSULIN ASPART 100 UNIT/ML ~~LOC~~ SOLN: 0.0000 [IU] | Freq: Every day | SUBCUTANEOUS | Status: DC

## 2011-11-13 MED ORDER — POTASSIUM CHLORIDE CRYS ER 20 MEQ PO TBCR
30.0000 meq | EXTENDED_RELEASE_TABLET | Freq: Two times a day (BID) | ORAL | Status: DC
  Administered 2011-11-13 (×2): 30 meq via ORAL
  Filled 2011-11-13 (×2): qty 1

## 2011-11-13 MED ORDER — FAMOTIDINE 20 MG PO TABS
20.0000 mg | ORAL_TABLET | Freq: Two times a day (BID) | ORAL | Status: DC
  Administered 2011-11-13 – 2011-11-16 (×7): 20 mg via ORAL
  Filled 2011-11-13 (×7): qty 1

## 2011-11-13 MED ORDER — INSULIN ASPART 100 UNIT/ML ~~LOC~~ SOLN
0.0000 [IU] | Freq: Three times a day (TID) | SUBCUTANEOUS | Status: DC
  Administered 2011-11-13: 4 [IU] via SUBCUTANEOUS
  Administered 2011-11-13: 3 [IU] via SUBCUTANEOUS
  Administered 2011-11-14 (×2): 4 [IU] via SUBCUTANEOUS
  Administered 2011-11-15 (×3): 3 [IU] via SUBCUTANEOUS

## 2011-11-13 MED ORDER — POTASSIUM CHLORIDE CRYS ER 20 MEQ PO TBCR
40.0000 meq | EXTENDED_RELEASE_TABLET | Freq: Once | ORAL | Status: AC
  Administered 2011-11-13: 40 meq via ORAL
  Filled 2011-11-13: qty 2

## 2011-11-13 MED ORDER — DEXTROSE 5 % IV SOLN
INTRAVENOUS | Status: AC
  Filled 2011-11-13: qty 500

## 2011-11-13 MED ORDER — DEXTROSE 5 % IV SOLN
500.0000 mg | INTRAVENOUS | Status: DC
  Administered 2011-11-13 – 2011-11-15 (×3): 500 mg via INTRAVENOUS
  Filled 2011-11-13 (×3): qty 500

## 2011-11-13 MED ORDER — SODIUM CHLORIDE 0.9 % IV SOLN
INTRAVENOUS | Status: DC
  Administered 2011-11-15: 05:00:00 via INTRAVENOUS

## 2011-11-13 MED ORDER — DEXTROSE 5 % IV SOLN
1.0000 g | INTRAVENOUS | Status: DC
  Administered 2011-11-13 – 2011-11-15 (×3): 1 g via INTRAVENOUS
  Filled 2011-11-13 (×5): qty 10

## 2011-11-13 MED ORDER — METHYLPREDNISOLONE SODIUM SUCC 125 MG IJ SOLR
80.0000 mg | Freq: Four times a day (QID) | INTRAMUSCULAR | Status: DC
  Administered 2011-11-13 – 2011-11-14 (×4): 80 mg via INTRAVENOUS
  Filled 2011-11-13 (×4): qty 2

## 2011-11-13 NOTE — Progress Notes (Signed)
Subjective: The patient says that she is breathing better. She still has some chest congestion. She has no complaints of chest pain.  Objective: Vital signs in last 24 hours: Filed Vitals:   11/13/11 0500 11/13/11 0600 11/13/11 0618 11/13/11 0742  BP: 111/72 136/63    Pulse: 93     Temp:   97.7 F (36.5 C) 98.2 F (36.8 C)  TempSrc:   Oral   Resp: 20 29    Height:      Weight:      SpO2:        Intake/Output Summary (Last 24 hours) at 11/13/11 0981 Last data filed at 11/13/11 0000  Gross per 24 hour  Intake      0 ml  Output    200 ml  Net   -200 ml    Weight change:   General: Pleasant overweight 66 year old sitting up in bed, eating breakfast. No acute distress. HEENT: Head is normocephalic, nontraumatic. Pupils are equal, round, and reactive to light. Extraocular movements are intact. Oropharynx reveals mildly dry mucous membranes. Lungs: Patent wheezes and crackles auscultated bilaterally. Decreased breath sounds in the bases. Wet cough. Heart: S1, S2, with no murmurs rubs or gallops. Abdomen: Positive bowel sounds, soft, mild hypogastric tenderness, no rigidity, distention, or masses palpated. Extremities: Trace of pedal edema. Neurologic: She is alert and oriented x3. Cranial nerves II through XII are grossly intact.  Lab Results: Basic Metabolic Panel:  Basename 11/13/11 0455 11/12/11 1953  NA 128* 125*  K 3.1* 3.2*  CL 82* 79*  CO2 39* 39*  GLUCOSE 163* 153*  BUN 5* 6  CREATININE 0.20* 0.22*  CALCIUM 9.5 9.4  MG -- --  PHOS -- --   Liver Function Tests:  [facility] 11/12/11 1953  AST 21  ALT 14  ALKPHOS 93  BILITOT 0.3  PROT 7.6  ALBUMIN 3.4*   No results found for this basename: LIPASE:2,AMYLASE:2 in the last 72 hours No results found for this basename: AMMONIA:2 in the last 72 hours CBC:  Basename 11/13/11 0455 11/12/11 1953  WBC 6.1 10.2  NEUTROABS -- 8.4*  HGB 12.0 12.3  HCT 36.2 36.8  MCV 85.2 85.8  PLT 152 173   Cardiac  Enzymes:  Basename 11/12/11 1953  CKTOTAL --  CKMB --  CKMBINDEX --  TROPONINI <0.30   BNP:  Basename 11/13/11 0523 11/12/11 1953  PROBNP 490.8* 109.1   D-Dimer: No results found for this basename: DDIMER:2 in the last 72 hours CBG: No results found for this basename: GLUCAP:6 in the last 72 hours Hemoglobin A1C: No results found for this basename: HGBA1C in the last 72 hours Fasting Lipid Panel: No results found for this basename: CHOL,HDL,LDLCALC,TRIG,CHOLHDL,LDLDIRECT in the last 72 hours Thyroid Function Tests: No results found for this basename: TSH,T4TOTAL,FREET4,T3FREE,THYROIDAB in the last 72 hours Anemia Panel: No results found for this basename: VITAMINB12,FOLATE,FERRITIN,TIBC,IRON,RETICCTPCT in the last 72 hours Coagulation: No results found for this basename: LABPROT:2,INR:2 in the last 72 hours Urine Drug Screen: Drugs of Abuse  No results found for this basename: labopia, cocainscrnur, labbenz, amphetmu, thcu, labbarb    Alcohol Level: No results found for this basename: ETH:2 in the last 72 hours Urinalysis: No results found for this basename: COLORURINE:2,APPERANCEUR:2,LABSPEC:2,PHURINE:2,GLUCOSEU:2,HGBUR:2,BILIRUBINUR:2,KETONESUR:2,PROTEINUR:2,UROBILINOGEN:2,NITRITE:2,LEUKOCYTESUR:2 in the last 72 hours Misc. Labs:   Micro: Recent Results (from the past 240 hour(s))  MRSA PCR SCREENING     Status: Normal   Collection Time   11/12/11 11:13 PM      Component Value Range Status Comment  MRSA by PCR NEGATIVE  NEGATIVE Final     Studies/Results: Dg Chest Port 1 View  11/12/2011  *RADIOLOGY REPORT*  Clinical Data: Severe shortness of breath for past 2 days.  PORTABLE CHEST - 1 VIEW  Comparison: Chest x-ray 10/06/2010.  Findings: Lung volumes are very low.  Again noted is gaseous distension of the stomach and mild elevation of the left hemidiaphragm.  There are bibasilar opacities, favored to represent areas of bibasilar subsegmental atelectasis or scarring.   No definite consolidative airspace disease.  No definite pleural effusions.  Cardiac silhouette is nearly completely obscured. Mediastinal contours are unremarkable.  IMPRESSION: 1.  Low volume chest with probable bibasilar subsegmental atelectasis, and no definite radiographic evidence of acute cardiopulmonary disease.  Original Report Authenticated By: Florencia Reasons, M.D.    Medications:  Scheduled:   . albuterol  2.5 mg Nebulization Q4H WA  . albuterol  5 mg Nebulization Once  . albuterol      . azithromycin  500 mg Intravenous Q24H  . baclofen  10 mg Oral BID  . carisoprodol  350 mg Oral Q8H  . carvedilol  12.5 mg Oral BID WC  . docusate sodium  100 mg Oral BID  . enoxaparin  40 mg Subcutaneous Q24H  . famotidine  20 mg Oral BID  . guaiFENesin  1,200 mg Oral BID  . ipratropium      . ipratropium  0.5 mg Nebulization Once  . ipratropium  0.5 mg Nebulization Q4H WA  . latanoprost  1 drop Both Eyes QHS  . LORazepam  1 mg Oral TID  . meclizine  25 mg Oral Daily  . methylPREDNISolone sodium succinate  125 mg Intravenous Once  . methylPREDNISolone (SOLU-MEDROL) injection  80 mg Intravenous Q6H  . potassium chloride  40 mEq Oral Once  . simvastatin  20 mg Oral QHS  . sodium chloride  3 mL Intravenous Q12H  . DISCONTD: methylPREDNISolone (SOLU-MEDROL) injection  125 mg Intramuscular Once  . DISCONTD: methylPREDNISolone (SOLU-MEDROL) injection  125 mg Intravenous Q6H   Continuous:  WUJ:[id number], acetaminophen, HYDROmorphone (DILAUDID) injection, ondansetron (ZOFRAN) IV, traMADol  Assessment: Active Problems:  COPD (chronic obstructive pulmonary disease) with acute bronchitis  Acute-on-chronic respiratory failure  Muscular dystrophy  CHF (congestive heart failure)  Hypercapnia  Hypokalemia    1. Oxygen-dependent COPD with acute bronchitic exacerbation and acute on chronic respiratory failure with hypercapnia. Results of the ABG noted for hypercapnia but with a normal  pH therefore, the patient's PCO2 of 74, is likely close to baseline. We'll continue IV steroids, bronchodilator therapy, and azithromycin. We'll add Rocephin for better coverage. Of note, she is on carvedilol which could be contributing to bronchospasms. This will be continued for now, but will consider decreasing the dose or holding it altogether if her bronchospasms worsen.  History of congestive heart failure. I have no record of her ejection fraction. She is treated with both Lasix and Zaroxolyn. Both are currently on hold. Her pro BNP is modestly elevated. She does not appear to have decompensated heart failure.  Hyponatremia. This may be secondary to mild hypovolemia.  Hypokalemia. Likely secondary to diuretic therapy. Will rule out magnesium deficiency.  Chronic muscular dystrophy.  Hyperglycemia, likely steroid induced.   Plan:  1. Will add gentle normal saline. We'll continue to hold Lasix and Zaroxolyn for another 24 hours. We'll monitor her pulmonary and heart status closely. 2. Will replete with potassium chloride in the IV fluids and orally. We'll check a magnesium level to rule out  deficiency. 3. We'll decrease the dose of the Solu-Medrol 80 mg every 6 hours. We'll add prophylactic Pepcid. 4. Was sliding scale NovoLog for treatment of steroid-induced hyperglycemia. 5. We'll add Rocephin for better antibiotic coverage. 6. We'll consult his pulmonologist, Dr. Juanetta Gosling. 7. We'll order additional labs.      LOS: 1 day   Roswell Ndiaye 11/13/2011, 8:32 AM

## 2011-11-13 NOTE — Consult Note (Signed)
Consult requested by: Dr. Sherrie Mustache Consult requested for COPD exacerbation:  HPI: This is a 66 year old who has a long known history of COPD and multiple other medical problems including CHF and muscular dystrophy. She's also had problems with hypertension chronic pain. She was in her usual state of fair health when she developed increasing shortness of breath. She was coughing some but not much. She did not have any fever or chills. Her breathing got to the point that she could not manage at home and she was brought to the emergency room for evaluation. She was treated in the emergency room but unable to be stabilized enough for discharge. She has been on antibiotics bronchodilators etc. she says she feels somewhat better today.  Past Medical History  Diagnosis Date  . Muscular dystrophy   . COPD (chronic obstructive pulmonary disease)   . Asthma   . Hypoxemia   . CHF (congestive heart failure)   . Chronic pain   . Hypertension   . Glaucoma   . Muscle weakness      History reviewed. No pertinent family history.   History   Social History  . Marital Status: Single    Spouse Name: N/A    Number of Children: N/A  . Years of Education: N/A   Social History Main Topics  . Smoking status: Never Smoker   . Smokeless tobacco: None  . Alcohol Use: No  . Drug Use: No  . Sexually Active: Not Currently    Birth Control/ Protection: Post-menopausal   Other Topics Concern  . None   Social History Narrative  . None     ROS: She denies any chest pain. She has not had any hemoptysis.    Objective: Vital signs in last 24 hours: Temp:  [97.7 F (36.5 C)-99.8 F (37.7 C)] 98.2 F (36.8 C) (06/29 0742) Pulse Rate:  [93-109] 93  (06/29 0500) Resp:  [18-31] 29  (06/29 0600) BP: (111-182)/(55-95) 136/63 mmHg (06/29 0600) SpO2:  [89 %-94 %] 93 % (06/29 0856) FiO2 (%):  [3 %-4 %] 3 % (06/28 2046) Weight:  [93.441 kg (206 lb)-95.6 kg (210 lb 12.2 oz)] 95.6 kg (210 lb 12.2 oz) (06/28  2330) Weight change:  Last BM Date: 11/12/11  Intake/Output from previous day: 06/28 0701 - 06/29 0700 In: -  Out: 200 [Urine:200]  PHYSICAL EXAM She is awake and alert. She is obese. She does not seem to be in acute distress now. She is coughing intermittently. Her pupils are reactive to light and accommodation. Her nose and throat are clear. Her mucous membranes are slightly dry. Her neck is supple without masses. Her chest is with decreased breath sounds and expiratory wheezes. Her heart is regular without gallop. Her abdomen is soft without masses. She does not have any edema. Her central nervous system examination is grossly intact  Lab Results: Basic Metabolic Panel:  Basename 11/13/11 0455 11/13/11 11/12/11 1953  NA 128* -- 125*  K 3.1* -- 3.2*  CL 82* -- 79*  CO2 39* -- 39*  GLUCOSE 163* -- 153*  BUN 5* -- 6  CREATININE 0.20* -- 0.22*  CALCIUM 9.5 -- 9.4  MG -- 1.7 --  PHOS -- -- --   Liver Function Tests:  [facility] 11/12/11 1953  AST 21  ALT 14  ALKPHOS 93  BILITOT 0.3  PROT 7.6  ALBUMIN 3.4*   No results found for this basename: LIPASE:2,AMYLASE:2 in the last 72 hours No results found for this basename: AMMONIA:2 in the  last 72 hours CBC:  Basename 11/13/11 0455 11/12/11 1953  WBC 6.1 10.2  NEUTROABS -- 8.4*  HGB 12.0 12.3  HCT 36.2 36.8  MCV 85.2 85.8  PLT 152 173   Cardiac Enzymes:  Basename 11/12/11 1953  CKTOTAL --  CKMB --  CKMBINDEX --  TROPONINI <0.30   BNP:  Basename 11/13/11 0523 11/12/11 1953  PROBNP 490.8* 109.1   D-Dimer: No results found for this basename: DDIMER:2 in the last 72 hours CBG: No results found for this basename: GLUCAP:6 in the last 72 hours Hemoglobin A1C: No results found for this basename: HGBA1C in the last 72 hours Fasting Lipid Panel: No results found for this basename: CHOL,HDL,LDLCALC,TRIG,CHOLHDL,LDLDIRECT in the last 72 hours Thyroid Function Tests: No results found for this basename:  TSH,T4TOTAL,FREET4,T3FREE,THYROIDAB in the last 72 hours Anemia Panel: No results found for this basename: VITAMINB12,FOLATE,FERRITIN,TIBC,IRON,RETICCTPCT in the last 72 hours Coagulation: No results found for this basename: LABPROT:2,INR:2 in the last 72 hours Urine Drug Screen: Drugs of Abuse  No results found for this basename: labopia, cocainscrnur, labbenz, amphetmu, thcu, labbarb    Alcohol Level: No results found for this basename: ETH:2 in the last 72 hours Urinalysis: No results found for this basename: COLORURINE:2,APPERANCEUR:2,LABSPEC:2,PHURINE:2,GLUCOSEU:2,HGBUR:2,BILIRUBINUR:2,KETONESUR:2,PROTEINUR:2,UROBILINOGEN:2,NITRITE:2,LEUKOCYTESUR:2 in the last 72 hours Misc. Labs:   ABGS:  Basename 11/12/11 2043  PHART 7.361  PO2ART 67.4*  TCO2 37.5  HCO3 41.1*     MICROBIOLOGY: Recent Results (from the past 240 hour(s))  MRSA PCR SCREENING     Status: Normal   Collection Time   11/12/11 11:13 PM      Component Value Range Status Comment   MRSA by PCR NEGATIVE  NEGATIVE Final     Studies/Results: Dg Chest Port 1 View  11/12/2011  *RADIOLOGY REPORT*  Clinical Data: Severe shortness of breath for past 2 days.  PORTABLE CHEST - 1 VIEW  Comparison: Chest x-ray 10/06/2010.  Findings: Lung volumes are very low.  Again noted is gaseous distension of the stomach and mild elevation of the left hemidiaphragm.  There are bibasilar opacities, favored to represent areas of bibasilar subsegmental atelectasis or scarring.  No definite consolidative airspace disease.  No definite pleural effusions.  Cardiac silhouette is nearly completely obscured. Mediastinal contours are unremarkable.  IMPRESSION: 1.  Low volume chest with probable bibasilar subsegmental atelectasis, and no definite radiographic evidence of acute cardiopulmonary disease.  Original Report Authenticated By: Florencia Reasons, M.D.    Medications:  Scheduled:   . albuterol  2.5 mg Nebulization Q4H WA  . albuterol  5  mg Nebulization Once  . albuterol      . azithromycin  500 mg Intravenous Q24H  . baclofen  10 mg Oral BID  . carisoprodol  350 mg Oral Q8H  . carvedilol  12.5 mg Oral BID WC  . cefTRIAXone (ROCEPHIN)  IV  1 g Intravenous Q24H  . docusate sodium  100 mg Oral BID  . enoxaparin  40 mg Subcutaneous Q24H  . famotidine  20 mg Oral BID  . guaiFENesin  1,200 mg Oral BID  . insulin aspart  0-20 Units Subcutaneous TID WC  . insulin aspart  0-5 Units Subcutaneous QHS  . ipratropium      . ipratropium  0.5 mg Nebulization Once  . ipratropium  0.5 mg Nebulization Q4H WA  . latanoprost  1 drop Both Eyes QHS  . LORazepam  1 mg Oral TID  . meclizine  25 mg Oral Daily  . methylPREDNISolone sodium succinate  125 mg Intravenous  Once  . methylPREDNISolone (SOLU-MEDROL) injection  80 mg Intravenous Q6H  . potassium chloride  30 mEq Oral BID  . potassium chloride  40 mEq Oral Once  . simvastatin  20 mg Oral QHS  . sodium chloride  3 mL Intravenous Q12H  . DISCONTD: methylPREDNISolone (SOLU-MEDROL) injection  125 mg Intramuscular Once  . DISCONTD: methylPREDNISolone (SOLU-MEDROL) injection  125 mg Intravenous Q6H   Continuous:   . sodium chloride 50 mL/hr at 11/13/11 1610   RUE:[id number], acetaminophen, HYDROmorphone (DILAUDID) injection, ondansetron (ZOFRAN) IV, traMADol  Assesment: She is admitted with COPD with acute exacerbation. She has acute exacerbation of chronic respiratory failure. She does have some element of CHF. She is muscular dystrophy which also probably increases her problems with her breathing Active Problems:  COPD (chronic obstructive pulmonary disease) with acute bronchitis  Acute-on-chronic respiratory failure  Muscular dystrophy  CHF (congestive heart failure)  Hypercapnia  Hypokalemia  Hyponatremia    Plan: I agree with treatment. She's having trouble coughing upper sputum and she's already on guaifenesin so I'm going to add a flutter valve and see if that  helps    LOS: 1 day   Facundo Allemand L 11/13/2011, 9:47 AM

## 2011-11-14 DIAGNOSIS — J441 Chronic obstructive pulmonary disease with (acute) exacerbation: Secondary | ICD-10-CM

## 2011-11-14 DIAGNOSIS — J9620 Acute and chronic respiratory failure, unspecified whether with hypoxia or hypercapnia: Secondary | ICD-10-CM

## 2011-11-14 DIAGNOSIS — R7989 Other specified abnormal findings of blood chemistry: Secondary | ICD-10-CM

## 2011-11-14 DIAGNOSIS — E871 Hypo-osmolality and hyponatremia: Secondary | ICD-10-CM

## 2011-11-14 LAB — BASIC METABOLIC PANEL
BUN: 10 mg/dL (ref 6–23)
CO2: 37 mEq/L — ABNORMAL HIGH (ref 19–32)
Chloride: 89 mEq/L — ABNORMAL LOW (ref 96–112)
Creatinine, Ser: 0.22 mg/dL — ABNORMAL LOW (ref 0.50–1.10)

## 2011-11-14 LAB — CBC
HCT: 36.7 % (ref 36.0–46.0)
MCHC: 32.4 g/dL (ref 30.0–36.0)
MCV: 88 fL (ref 78.0–100.0)
RDW: 14.3 % (ref 11.5–15.5)

## 2011-11-14 LAB — GLUCOSE, CAPILLARY
Glucose-Capillary: 157 mg/dL — ABNORMAL HIGH (ref 70–99)
Glucose-Capillary: 179 mg/dL — ABNORMAL HIGH (ref 70–99)

## 2011-11-14 MED ORDER — POTASSIUM CHLORIDE CRYS ER 20 MEQ PO TBCR
20.0000 meq | EXTENDED_RELEASE_TABLET | Freq: Every day | ORAL | Status: DC
  Administered 2011-11-14 – 2011-11-16 (×3): 20 meq via ORAL
  Filled 2011-11-14 (×3): qty 1

## 2011-11-14 MED ORDER — METHYLPREDNISOLONE SODIUM SUCC 125 MG IJ SOLR
60.0000 mg | Freq: Four times a day (QID) | INTRAMUSCULAR | Status: DC
  Administered 2011-11-14 – 2011-11-15 (×4): 60 mg via INTRAVENOUS
  Filled 2011-11-14 (×4): qty 2

## 2011-11-14 MED ORDER — FUROSEMIDE 20 MG PO TABS
20.0000 mg | ORAL_TABLET | Freq: Every day | ORAL | Status: DC
  Administered 2011-11-14 – 2011-11-16 (×3): 20 mg via ORAL
  Filled 2011-11-14 (×3): qty 1

## 2011-11-14 NOTE — Progress Notes (Signed)
Subjective: She says she feels better. She has no new complaints. She is breathing better. She still congested and not able to cough very much. She says she has a little more difficulty with breathing when she's sitting up and it looks to me like some of that is because of her inability to hold her head upright  Objective: Vital signs in last 24 hours: Temp:  [98 F (36.7 C)-98.8 F (37.1 C)] 98 F (36.7 C) (06/30 0800) Pulse Rate:  [91-114] 93  (06/30 0700) Resp:  [20-32] 21  (06/30 0700) BP: (109-133)/(55-84) 127/68 mmHg (06/30 0700) SpO2:  [94 %-95 %] 94 % (06/30 0800) Weight:  [95.7 kg (210 lb 15.7 oz)] 95.7 kg (210 lb 15.7 oz) (06/30 0500) Weight change: 2.259 kg (4 lb 15.7 oz) Last BM Date: 11/12/11  Intake/Output from previous day: 06/29 0701 - 06/30 0700 In: 3373 [P.O.:1920; I.V.:1153; IV Piggyback:300] Out: 650 [Urine:650]  PHYSICAL EXAM General appearance: alert, cooperative and morbidly obese Resp: rhonchi bilaterally Cardio: regular rate and rhythm, S1, S2 normal, no murmur, click, rub or gallop GI: soft, non-tender; bowel sounds normal; no masses,  no organomegaly Extremities: extremities normal, atraumatic, no cyanosis or edema  Lab Results:    Basic Metabolic Panel:  Basename 11/14/11 0521 11/13/11 0455 11/13/11  NA 133* 128* --  K 4.3 3.1* --  CL 89* 82* --  CO2 37* 39* --  GLUCOSE 164* 163* --  BUN 10 5* --  CREATININE 0.22* 0.20* --  CALCIUM 9.3 9.5 --  MG -- -- 1.7  PHOS -- -- --   Liver Function Tests:  [facility] 11/12/11 1953  AST 21  ALT 14  ALKPHOS 93  BILITOT 0.3  PROT 7.6  ALBUMIN 3.4*   No results found for this basename: LIPASE:2,AMYLASE:2 in the last 72 hours No results found for this basename: AMMONIA:2 in the last 72 hours CBC:  Basename 11/14/11 0521 11/13/11 0455 11/12/11 1953  WBC 6.7 6.1 --  NEUTROABS -- -- 8.4*  HGB 11.9* 12.0 --  HCT 36.7 36.2 --  MCV 88.0 85.2 --  PLT 185 152 --   Cardiac Enzymes:  Basename  11/12/11 1953  CKTOTAL --  CKMB --  CKMBINDEX --  TROPONINI <0.30   BNP:  Basename 11/13/11 0523 11/12/11 1953  PROBNP 490.8* 109.1   D-Dimer: No results found for this basename: DDIMER:2 in the last 72 hours CBG:  Basename 11/14/11 0718 11/13/11 2146 11/13/11 1642 11/13/11 1142  GLUCAP 157* 158* 166* 143*   Hemoglobin A1C: No results found for this basename: HGBA1C in the last 72 hours Fasting Lipid Panel: No results found for this basename: CHOL,HDL,LDLCALC,TRIG,CHOLHDL,LDLDIRECT in the last 72 hours Thyroid Function Tests:  Basename 11/13/11  TSH 0.699  T4TOTAL --  FREET4 --  T3FREE --  THYROIDAB --   Anemia Panel: No results found for this basename: VITAMINB12,FOLATE,FERRITIN,TIBC,IRON,RETICCTPCT in the last 72 hours Coagulation: No results found for this basename: LABPROT:2,INR:2 in the last 72 hours Urine Drug Screen: Drugs of Abuse  No results found for this basename: labopia, cocainscrnur, labbenz, amphetmu, thcu, labbarb    Alcohol Level: No results found for this basename: ETH:2 in the last 72 hours Urinalysis: No results found for this basename: COLORURINE:2,APPERANCEUR:2,LABSPEC:2,PHURINE:2,GLUCOSEU:2,HGBUR:2,BILIRUBINUR:2,KETONESUR:2,PROTEINUR:2,UROBILINOGEN:2,NITRITE:2,LEUKOCYTESUR:2 in the last 72 hours Misc. Labs:  ABGS  Basename 11/12/11 2043  PHART 7.361  PO2ART 67.4*  TCO2 37.5  HCO3 41.1*   CULTURES Recent Results (from the past 240 hour(s))  MRSA PCR SCREENING     Status: Normal  Collection Time   11/12/11 11:13 PM      Component Value Range Status Comment   MRSA by PCR NEGATIVE  NEGATIVE Final    Studies/Results: Dg Chest Port 1 View  11/12/2011  *RADIOLOGY REPORT*  Clinical Data: Severe shortness of breath for past 2 days.  PORTABLE CHEST - 1 VIEW  Comparison: Chest x-ray 10/06/2010.  Findings: Lung volumes are very low.  Again noted is gaseous distension of the stomach and mild elevation of the left hemidiaphragm.  There are  bibasilar opacities, favored to represent areas of bibasilar subsegmental atelectasis or scarring.  No definite consolidative airspace disease.  No definite pleural effusions.  Cardiac silhouette is nearly completely obscured. Mediastinal contours are unremarkable.  IMPRESSION: 1.  Low volume chest with probable bibasilar subsegmental atelectasis, and no definite radiographic evidence of acute cardiopulmonary disease.  Original Report Authenticated By: Florencia Reasons, M.D.    Medications:  Scheduled:   . albuterol  2.5 mg Nebulization Q4H WA  . azithromycin  500 mg Intravenous Q24H  . baclofen  10 mg Oral BID  . carisoprodol  350 mg Oral Q8H  . carvedilol  12.5 mg Oral BID WC  . cefTRIAXone (ROCEPHIN)  IV  1 g Intravenous Q24H  . docusate sodium  100 mg Oral BID  . enoxaparin  40 mg Subcutaneous Q24H  . famotidine  20 mg Oral BID  . furosemide  20 mg Oral Daily  . guaiFENesin  1,200 mg Oral BID  . insulin aspart  0-20 Units Subcutaneous TID WC  . insulin aspart  0-5 Units Subcutaneous QHS  . ipratropium  0.5 mg Nebulization Q4H WA  . latanoprost  1 drop Both Eyes QHS  . LORazepam  1 mg Oral TID  . meclizine  25 mg Oral Daily  . methylPREDNISolone (SOLU-MEDROL) injection  60 mg Intravenous Q6H  . potassium chloride  20 mEq Oral Daily  . simvastatin  20 mg Oral QHS  . sodium chloride  3 mL Intravenous Q12H  . DISCONTD: methylPREDNISolone (SOLU-MEDROL) injection  80 mg Intravenous Q6H  . DISCONTD: potassium chloride  30 mEq Oral BID   Continuous:   . sodium chloride 50 mL/hr at 11/14/11 0800   ZOX:[id number], acetaminophen, HYDROmorphone (DILAUDID) injection, ondansetron (ZOFRAN) IV, traMADol  Assesment: She has COPD with acute exacerbation. She has acute on chronic respiratory failure both on the basis of her COPD and related to her muscular dystrophy. Active Problems:  COPD (chronic obstructive pulmonary disease) with acute bronchitis  Acute-on-chronic respiratory  failure  Muscular dystrophy  CHF (congestive heart failure)  Hypercapnia  Hypokalemia  Hyponatremia    Plan: No change in treatments continue with steroids etc. area Dr. Sherrie Mustache has reduced the dose of steroids which I think is appropriate.    LOS: 2 days   Javayah Magaw L 11/14/2011, 9:03 AM

## 2011-11-14 NOTE — Progress Notes (Signed)
Subjective: The patient says that she is breathing better. She still has some chest congestion. She has no complaints of chest pain.  Objective: Vital signs in last 24 hours: Filed Vitals:   11/14/11 0400 11/14/11 0500 11/14/11 0600 11/14/11 0713  BP: 131/68 111/84    Pulse: 93 93 96   Temp: 98.4 F (36.9 C)     TempSrc: Oral     Resp: 22 23 21    Height:      Weight:  95.7 kg (210 lb 15.7 oz)    SpO2:    94%    Intake/Output Summary (Last 24 hours) at 11/14/11 0759 Last data filed at 11/14/11 0600  Gross per 24 hour  Intake   3083 ml  Output    650 ml  Net   2433 ml    Weight change: 2.259 kg (4 lb 15.7 oz)  General:  HEENT: Head is normocephalic, nontraumatic. Pupils are equal, round, and reactive to light. Extraocular movements are intact. Oropharynx reveals mildly dry mucous membranes. Lungs: Fewer wheezes and crackles auscultated bilaterally. Decreased breath sounds in the bases. Heart: S1, S2, with no murmurs rubs or gallops. Abdomen: Positive bowel sounds, soft, mild hypogastric tenderness, no rigidity, distention, or masses palpated. Extremities: Trace of pedal edema. Neurologic: She is alert and oriented x3. Cranial nerves II through XII are grossly intact.  Lab Results: Basic Metabolic Panel:  Basename 11/14/11 0521 11/13/11 0455 11/13/11  NA 133* 128* --  K 4.3 3.1* --  CL 89* 82* --  CO2 37* 39* --  GLUCOSE 164* 163* --  BUN 10 5* --  CREATININE 0.22* 0.20* --  CALCIUM 9.3 9.5 --  MG -- -- 1.7  PHOS -- -- --   Liver Function Tests:  [facility] 11/12/11 1953  AST 21  ALT 14  ALKPHOS 93  BILITOT 0.3  PROT 7.6  ALBUMIN 3.4*   No results found for this basename: LIPASE:2,AMYLASE:2 in the last 72 hours No results found for this basename: AMMONIA:2 in the last 72 hours CBC:  Basename 11/14/11 0521 11/13/11 0455 11/12/11 1953  WBC 6.7 6.1 --  NEUTROABS -- -- 8.4*  HGB 11.9* 12.0 --  HCT 36.7 36.2 --  MCV 88.0 85.2 --  PLT 185 152 --   Cardiac  Enzymes:  Basename 11/12/11 1953  CKTOTAL --  CKMB --  CKMBINDEX --  TROPONINI <0.30   BNP:  Basename 11/13/11 0523 11/12/11 1953  PROBNP 490.8* 109.1   D-Dimer: No results found for this basename: DDIMER:2 in the last 72 hours CBG:  Basename 11/14/11 0718 11/13/11 2146 11/13/11 1642 11/13/11 1142  GLUCAP 157* 158* 166* 143*   Hemoglobin A1C: No results found for this basename: HGBA1C in the last 72 hours Fasting Lipid Panel: No results found for this basename: CHOL,HDL,LDLCALC,TRIG,CHOLHDL,LDLDIRECT in the last 72 hours Thyroid Function Tests:  Basename 11/13/11  TSH 0.699  T4TOTAL --  FREET4 --  T3FREE --  THYROIDAB --   Anemia Panel: No results found for this basename: VITAMINB12,FOLATE,FERRITIN,TIBC,IRON,RETICCTPCT in the last 72 hours Coagulation: No results found for this basename: LABPROT:2,INR:2 in the last 72 hours Urine Drug Screen: Drugs of Abuse  No results found for this basename: labopia,  cocainscrnur,  labbenz,  amphetmu,  thcu,  labbarb    Alcohol Level: No results found for this basename: ETH:2 in the last 72 hours Urinalysis: No results found for this basename: COLORURINE:2,APPERANCEUR:2,LABSPEC:2,PHURINE:2,GLUCOSEU:2,HGBUR:2,BILIRUBINUR:2,KETONESUR:2,PROTEINUR:2,UROBILINOGEN:2,NITRITE:2,LEUKOCYTESUR:2 in the last 72 hours Misc. Labs:   Micro: Recent Results (from the past 240 hour(s))  MRSA PCR SCREENING     Status: Normal   Collection Time   11/12/11 11:13 PM      Component Value Range Status Comment   MRSA by PCR NEGATIVE  NEGATIVE Final     Studies/Results: Dg Chest Port 1 View  11/12/2011  *RADIOLOGY REPORT*  Clinical Data: Severe shortness of breath for past 2 days.  PORTABLE CHEST - 1 VIEW  Comparison: Chest x-ray 10/06/2010.  Findings: Lung volumes are very low.  Again noted is gaseous distension of the stomach and mild elevation of the left hemidiaphragm.  There are bibasilar opacities, favored to represent areas of bibasilar  subsegmental atelectasis or scarring.  No definite consolidative airspace disease.  No definite pleural effusions.  Cardiac silhouette is nearly completely obscured. Mediastinal contours are unremarkable.  IMPRESSION: 1.  Low volume chest with probable bibasilar subsegmental atelectasis, and no definite radiographic evidence of acute cardiopulmonary disease.  Original Report Authenticated By: Florencia Reasons, M.D.    Medications:  Scheduled:    . albuterol  2.5 mg Nebulization Q4H WA  . azithromycin  500 mg Intravenous Q24H  . baclofen  10 mg Oral BID  . carisoprodol  350 mg Oral Q8H  . carvedilol  12.5 mg Oral BID WC  . cefTRIAXone (ROCEPHIN)  IV  1 g Intravenous Q24H  . docusate sodium  100 mg Oral BID  . enoxaparin  40 mg Subcutaneous Q24H  . famotidine  20 mg Oral BID  . guaiFENesin  1,200 mg Oral BID  . insulin aspart  0-20 Units Subcutaneous TID WC  . insulin aspart  0-5 Units Subcutaneous QHS  . ipratropium  0.5 mg Nebulization Q4H WA  . latanoprost  1 drop Both Eyes QHS  . LORazepam  1 mg Oral TID  . meclizine  25 mg Oral Daily  . methylPREDNISolone (SOLU-MEDROL) injection  80 mg Intravenous Q6H  . potassium chloride  30 mEq Oral BID  . simvastatin  20 mg Oral QHS  . sodium chloride  3 mL Intravenous Q12H  . DISCONTD: methylPREDNISolone (SOLU-MEDROL) injection  125 mg Intravenous Q6H   Continuous:    . sodium chloride 50 mL/hr at 11/14/11 0600   ZOX:WRUEAVWUJWJXB, acetaminophen, HYDROmorphone (DILAUDID) injection, ondansetron (ZOFRAN) IV, traMADol  Assessment: Active Problems:  COPD (chronic obstructive pulmonary disease) with acute bronchitis  Acute-on-chronic respiratory failure  Muscular dystrophy  CHF (congestive heart failure)  Hypercapnia  Hypokalemia  Hyponatremia    1. Oxygen-dependent COPD with acute bronchitic exacerbation and acute on chronic respiratory failure with hypercapnia. Results of the ABG on admission noted for hypercapnia but with a  normal pH therefore, the patient's PCO2 of 74, is likely close to baseline. We'll continue IV steroids, bronchodilator therapy, and azithromycin. Rocephin added for better coverage. Of note, she is on carvedilol which could be contributing to bronchospasms. This will be continued for now, but will consider decreasing the dose or holding it altogether if her bronchospasms worsen. Dr. Juanetta Gosling is following and appreciate his assistance.  History of congestive heart failure. I have no record of her ejection fraction. She is treated with both Lasix and Zaroxolyn. Both are currently on hold, but will restart Lasix today. Her pro BNP is modestly elevated. She does not appear to have decompensated heart failure.  Hyponatremia. This may be secondary to mild hypovolemia. It is improving with gentle IV fluids and holding diuretics. TSH is within normal limits.  Hypokalemia. Resolved for now. Likely secondary to diuretic therapy. Supplementing. Magnesium level within normal limits.  Chronic muscular dystrophy.  Hyperglycemia, likely steroid induced. We'll continue sliding scale NovoLog.   Plan: 1. We'll continue gentle IV fluids. Will restart Lasix, but continue to hold Zaroxolyn. 2. Decrease IV Solu-Medrol to 60 mg every 6 hours. 3. Out of bed to the chair. PT consultation tomorrow. 4. Could likely be transferred out of the step down unit tomorrow.      LOS: 2 days   Zeven Kocak 11/14/2011, 7:59 AM

## 2011-11-15 DIAGNOSIS — I509 Heart failure, unspecified: Secondary | ICD-10-CM

## 2011-11-15 DIAGNOSIS — G7109 Other specified muscular dystrophies: Secondary | ICD-10-CM

## 2011-11-15 DIAGNOSIS — J440 Chronic obstructive pulmonary disease with acute lower respiratory infection: Principal | ICD-10-CM

## 2011-11-15 LAB — BASIC METABOLIC PANEL
BUN: 12 mg/dL (ref 6–23)
Creatinine, Ser: 0.22 mg/dL — ABNORMAL LOW (ref 0.50–1.10)
GFR calc Af Amer: >90 mL/min (ref >90)
GFR calc non Af Amer: >90 mL/min (ref >90)

## 2011-11-15 LAB — GLUCOSE, CAPILLARY: Glucose-Capillary: 131 mg/dL — ABNORMAL HIGH (ref 70–99)

## 2011-11-15 MED ORDER — METOLAZONE 5 MG PO TABS
5.0000 mg | ORAL_TABLET | ORAL | Status: DC
  Administered 2011-11-15: 5 mg via ORAL
  Filled 2011-11-15: qty 1

## 2011-11-15 MED ORDER — SODIUM CHLORIDE 0.9 % IJ SOLN
INTRAMUSCULAR | Status: AC
  Filled 2011-11-15: qty 3

## 2011-11-15 MED ORDER — AZITHROMYCIN 250 MG PO TABS: 250.0000 mg | ORAL_TABLET | Freq: Every day | ORAL | Status: DC

## 2011-11-15 MED ORDER — PREDNISONE 20 MG PO TABS
40.0000 mg | ORAL_TABLET | Freq: Two times a day (BID) | ORAL | Status: DC
  Administered 2011-11-15 – 2011-11-16 (×3): 40 mg via ORAL
  Filled 2011-11-15 (×3): qty 2

## 2011-11-15 NOTE — Evaluation (Signed)
Physical Therapy Evaluation Patient Details Name: Karen Collier MRN: [id number] DOB: Nov 29, 1945 Today's Date: 11/15/2011 Time: 4098-1191 PT Time Calculation (min): 39 min  PT Assessment / Plan / Recommendation Clinical Impression  Karen Collier is referred to PT for generalized weakness from a recurrent bought of COPD.  She has a significant hx of limb girdle muscular dystrophy which has left her with generalized weakness, however she reports she is more tired than usual.   Pt required assistance for pericare during treatment today.  SPT: EOB to BSC to Recliner (elevated w/3 pillows)     PT Assessment  Patient needs continued PT services    Follow Up Recommendations  Skilled nursing facility    Barriers to Discharge        Equipment Recommendations  None recommended by PT    Recommendations for Other Services     Frequency Min 3X/week    Precautions / Restrictions Precautions Precautions: Fall   Pertinent Vitals/Pain No reported/Generally uncomfortably from pre-morbid condition      Mobility  Bed Mobility Bed Mobility: Sit to Supine Sit to Supine: HOB elevated;5: Supervision Transfers Transfers: Sit to Stand;Stand to Sit;Stand Pivot Transfers Sit to Stand: 3: Mod assist;From elevated surface Stand to Sit: 3: Mod assist Stand Pivot Transfers: 3: Mod assist Details for Transfer Assistance: use or RW during all transfers.  SPT: EOB to BSC to Recliner Ambulation/Gait Ambulation/Gait Assistance: 2: Max assist Assistive device: Rolling walker    Exercises     PT Diagnosis: Generalized weakness  PT Problem List: Decreased strength;Decreased activity tolerance PT Treatment Interventions: Gait training;Functional mobility training;Therapeutic activities;Therapeutic exercise   PT Goals Acute Rehab PT Goals PT Goal Formulation: With patient Time For Goal Achievement: 11/19/11 Potential to Achieve Goals: Good Pt will go Sit to Stand: with min assist PT Goal: Sit to Stand -  Progress: Goal set today Pt will go Stand to Sit: with min assist PT Goal: Stand to Sit - Progress: Goal set today Pt will Transfer Bed to Chair/Chair to Bed: with min assist PT Transfer Goal: Bed to Chair/Chair to Bed - Progress: Goal set today Pt will Stand: with min assist;with bilateral upper extremity support PT Goal: Stand - Progress: Goal set today  Visit Information  Last PT Received On: 11/15/11 Assistance Needed: +1    Subjective Data  Subjective: I live in a long term facility.  I walk a few feet, but never alone in my room.   Prior Functioning  Home Living Lives With: Other (Comment) (Long term care) Available Help at Discharge: Available 24 hours/day;Other (Comment) ([facility]) Prior Function Level of Independence: Needs assistance Needs Assistance: Bathing;Dressing;Feeding;Toileting;Gait;Transfers Gait Assistance: Assistance for a few feet of gait. Transfer Assistance: Assistance for transfers Able to Take Stairs?: No Driving: No Communication Communication: No difficulties    Cognition       Extremity/Trunk Assessment Trunk Assessment Trunk Assessment: Other exceptions Trunk Exceptions: has extreme laxity throughout joints, which makes pt very flexibile during pericare. Sits for assistance.   Balance    End of Session PT - End of Session Equipment Utilized During Treatment: Gait belt Activity Tolerance: Patient tolerated treatment well Patient left: in chair;with call bell/phone within reach Nurse Communication: Mobility status  GP     Karen Collier 11/15/2011, 1:53 PM

## 2011-11-15 NOTE — Progress Notes (Signed)
Subjective: She says she's feeling fairly well. She has no new complaints. Her breathing is better.  Objective: Vital signs in last 24 hours: Temp:  [97.1 F (36.2 C)-97.9 F (36.6 C)] 97.3 F (36.3 C) (07/01 0400) Pulse Rate:  [84-105] 95  (07/01 0600) Resp:  [19-32] 23  (07/01 0600) BP: (108-127)/(64-92) 124/78 mmHg (07/01 0400) SpO2:  [92 %-96 %] 96 % (07/01 0723) Weight:  [97.3 kg (214 lb 8.1 oz)] 97.3 kg (214 lb 8.1 oz) (07/01 0500) Weight change: 1.6 kg (3 lb 8.4 oz) Last BM Date: 11/14/11  Intake/Output from previous day: 06/30 0701 - 07/01 0700 In: 2533 [P.O.:1080; I.V.:1153; IV Piggyback:300] Out: -   PHYSICAL EXAM General appearance: alert, cooperative, mild distress and morbidly obese Resp: rhonchi bilaterally Cardio: regular rate and rhythm, S1, S2 normal, no murmur, click, rub or gallop GI: soft, non-tender; bowel sounds normal; no masses,  no organomegaly Extremities: extremities normal, atraumatic, no cyanosis or edema  Lab Results:    Basic Metabolic Panel:  Basename 11/15/11 0436 11/14/11 0521 11/13/11  NA 135 133* --  K 4.8 4.3 --  CL 93* 89* --  CO2 38* 37* --  GLUCOSE 147* 164* --  BUN 12 10 --  CREATININE 0.22* 0.22* --  CALCIUM 9.3 9.3 --  MG -- -- 1.7  PHOS -- -- --   Liver Function Tests:  [facility] 11/12/11 1953  AST 21  ALT 14  ALKPHOS 93  BILITOT 0.3  PROT 7.6  ALBUMIN 3.4*   No results found for this basename: LIPASE:2,AMYLASE:2 in the last 72 hours No results found for this basename: AMMONIA:2 in the last 72 hours CBC:  Basename 11/14/11 0521 11/13/11 0455 11/12/11 1953  WBC 6.7 6.1 --  NEUTROABS -- -- 8.4*  HGB 11.9* 12.0 --  HCT 36.7 36.2 --  MCV 88.0 85.2 --  PLT 185 152 --   Cardiac Enzymes:  Basename 11/12/11 1953  CKTOTAL --  CKMB --  CKMBINDEX --  TROPONINI <0.30   BNP:  Basename 11/13/11 0523 11/12/11 1953  PROBNP 490.8* 109.1   D-Dimer: No results found for this basename: DDIMER:2 in the last 72  hours CBG:  Basename 11/15/11 0728 11/14/11 2120 11/14/11 1637 11/14/11 1146 11/14/11 0718 11/13/11 2146  GLUCAP 148* 147* 113* 179* 157* 158*   Hemoglobin A1C:  Basename 11/14/11 0521  HGBA1C 5.6   Fasting Lipid Panel: No results found for this basename: CHOL,HDL,LDLCALC,TRIG,CHOLHDL,LDLDIRECT in the last 72 hours Thyroid Function Tests:  Basename 11/14/11 0521 11/13/11  TSH -- 0.699  T4TOTAL -- --  FREET4 1.41 --  T3FREE -- --  THYROIDAB -- --   Anemia Panel: No results found for this basename: VITAMINB12,FOLATE,FERRITIN,TIBC,IRON,RETICCTPCT in the last 72 hours Coagulation: No results found for this basename: LABPROT:2,INR:2 in the last 72 hours Urine Drug Screen: Drugs of Abuse  No results found for this basename: labopia, cocainscrnur, labbenz, amphetmu, thcu, labbarb    Alcohol Level: No results found for this basename: ETH:2 in the last 72 hours Urinalysis: No results found for this basename: COLORURINE:2,APPERANCEUR:2,LABSPEC:2,PHURINE:2,GLUCOSEU:2,HGBUR:2,BILIRUBINUR:2,KETONESUR:2,PROTEINUR:2,UROBILINOGEN:2,NITRITE:2,LEUKOCYTESUR:2 in the last 72 hours Misc. Labs:  ABGS  Basename 11/12/11 2043  PHART 7.361  PO2ART 67.4*  TCO2 37.5  HCO3 41.1*   CULTURES Recent Results (from the past 240 hour(s))  MRSA PCR SCREENING     Status: Normal   Collection Time   11/12/11 11:13 PM      Component Value Range Status Comment   MRSA by PCR NEGATIVE  NEGATIVE Final    Studies/Results:  No results found.  Medications:  Scheduled:   . albuterol  2.5 mg Nebulization Q4H WA  . azithromycin  500 mg Intravenous Q24H  . baclofen  10 mg Oral BID  . carisoprodol  350 mg Oral Q8H  . carvedilol  12.5 mg Oral BID WC  . cefTRIAXone (ROCEPHIN)  IV  1 g Intravenous Q24H  . docusate sodium  100 mg Oral BID  . enoxaparin  40 mg Subcutaneous Q24H  . famotidine  20 mg Oral BID  . furosemide  20 mg Oral Daily  . guaiFENesin  1,200 mg Oral BID  . insulin aspart  0-20 Units  Subcutaneous TID WC  . insulin aspart  0-5 Units Subcutaneous QHS  . ipratropium  0.5 mg Nebulization Q4H WA  . latanoprost  1 drop Both Eyes QHS  . LORazepam  1 mg Oral TID  . meclizine  25 mg Oral Daily  . methylPREDNISolone (SOLU-MEDROL) injection  60 mg Intravenous Q6H  . potassium chloride  20 mEq Oral Daily  . simvastatin  20 mg Oral QHS  . sodium chloride  3 mL Intravenous Q12H   Continuous:   . sodium chloride 50 mL/hr at 11/15/11 0600   AOZ:[id number], acetaminophen, HYDROmorphone (DILAUDID) injection, ondansetron (ZOFRAN) IV, traMADol  Assesment: She has COPD with acute exacerbation. She's better. She has no other new complaints. She has respiratory failure and that is at least partially from her muscular dystrophy Active Problems:  COPD (chronic obstructive pulmonary disease) with acute bronchitis  Acute-on-chronic respiratory failure  Muscular dystrophy  CHF (congestive heart failure)  Hypercapnia  Hypokalemia  Hyponatremia    Plan: No change in treatments. Continue antibiotics inhaled bronchodilators etc.    LOS: 3 days   Basel Defalco L 11/15/2011, 8:06 AM

## 2011-11-15 NOTE — Clinical Social Work Note (Signed)
[facility] administrator called, stated they are willing to take patient back at discharge.  Asked that we keep them informed of patient progress and needs.  Clovis Cao Clinical Social Worker [phone])

## 2011-11-15 NOTE — Progress Notes (Signed)
Chart reviewed.  Subjective: Complains of headache and neck pain from spasm. Breathing okay. Sat in a chair yesterday for the first time since admission. Chest congestion remains but cough has improved.  Objective: Vital signs in last 24 hours: Filed Vitals:   11/15/11 0400 11/15/11 0500 11/15/11 0600 11/15/11 0723  BP: 124/78     Pulse: 96 104 95   Temp: 97.3 F (36.3 C)     TempSrc: Oral     Resp: 22 30 23    Height:      Weight:  97.3 kg (214 lb 8.1 oz)    SpO2:    96%   Weight change: 1.6 kg (3 lb 8.4 oz)  Intake/Output Summary (Last 24 hours) at 11/15/11 0844 Last data filed at 11/15/11 0600  Gross per 24 hour  Intake   2123 ml  Output      0 ml  Net   2123 ml   General: Comfortable. Eating breakfast and watching TV. No respiratory distress Lungs : Bronchial breath sounds on the right base. No wheezes or rhonchi or rales Cardiovascular regular rate rhythm without murmurs gallops rubs Abdomen soft nontender nondistended Extremities no clubbing cyanosis or edema  Lab Results: Basic Metabolic Panel:  Lab 11/15/11 5284 11/14/11 0521 11/13/11  NA 135 133* --  K 4.8 4.3 --  CL 93* 89* --  CO2 38* 37* --  GLUCOSE 147* 164* --  BUN 12 10 --  CREATININE 0.22* 0.22* --  CALCIUM 9.3 9.3 --  MG -- -- 1.7  PHOS -- -- --   Liver Function Tests:  Lab 11/12/11 1953  AST 21  ALT 14  ALKPHOS 93  BILITOT 0.3  PROT 7.6  ALBUMIN 3.4*   No results found for this basename: LIPASE:2,AMYLASE:2 in the last 168 hours No results found for this basename: AMMONIA:2 in the last 168 hours CBC:  Lab 11/14/11 0521 11/13/11 0455 11/12/11 1953  WBC 6.7 6.1 --  NEUTROABS -- -- 8.4*  HGB 11.9* 12.0 --  HCT 36.7 36.2 --  MCV 88.0 85.2 --  PLT 185 152 --   Cardiac Enzymes:  Lab 11/12/11 1953  CKTOTAL --  CKMB --  CKMBINDEX --  TROPONINI <0.30   BNP:  Lab 11/13/11 0523 11/12/11 1953  PROBNP 490.8* 109.1   D-Dimer: No results found for this basename: DDIMER:2 in the  last 168 hours CBG:  Lab 11/15/11 0728 11/14/11 2120 11/14/11 1637 11/14/11 1146 11/14/11 0718 11/13/11 2146  GLUCAP 148* 147* 113* 179* 157* 158*   Hemoglobin A1C:  Lab 11/14/11 0521  HGBA1C 5.6   Fasting Lipid Panel: No results found for this basename: CHOL,HDL,LDLCALC,TRIG,CHOLHDL,LDLDIRECT in the last 132 hours Thyroid Function Tests:  Lab 11/14/11 0521 11/13/11  TSH -- 0.699  T4TOTAL -- --  FREET4 1.41 --  T3FREE -- --  THYROIDAB -- --    Micro Results: Recent Results (from the past 240 hour(s))  MRSA PCR SCREENING     Status: Normal   Collection Time   11/12/11 11:13 PM      Component Value Range Status Comment   MRSA by PCR NEGATIVE  NEGATIVE Final    Studies/Results: No results found.  Scheduled Meds:   . albuterol  2.5 mg Nebulization Q4H WA  . azithromycin  250 mg Oral Daily  . baclofen  10 mg Oral BID  . carisoprodol  350 mg Oral Q8H  . carvedilol  12.5 mg Oral BID WC  . cefTRIAXone (ROCEPHIN)  IV  1 g Intravenous  Q24H  . docusate sodium  100 mg Oral BID  . enoxaparin  40 mg Subcutaneous Q24H  . famotidine  20 mg Oral BID  . furosemide  20 mg Oral Daily  . guaiFENesin  1,200 mg Oral BID  . insulin aspart  0-20 Units Subcutaneous TID WC  . insulin aspart  0-5 Units Subcutaneous QHS  . ipratropium  0.5 mg Nebulization Q4H WA  . latanoprost  1 drop Both Eyes QHS  . LORazepam  1 mg Oral TID  . meclizine  25 mg Oral Daily  . metolazone  5 mg Oral QODAY  . potassium chloride  20 mEq Oral Daily  . predniSONE  40 mg Oral BID  . simvastatin  20 mg Oral QHS  . sodium chloride  3 mL Intravenous Q12H  . DISCONTD: azithromycin  500 mg Intravenous Q24H  . DISCONTD: methylPREDNISolone (SOLU-MEDROL) injection  60 mg Intravenous Q6H   Continuous Infusions:   . DISCONTD: sodium chloride 50 mL/hr at 11/15/11 0600   PRN Meds:.acetaminophen, acetaminophen, HYDROmorphone (DILAUDID) injection, ondansetron (ZOFRAN) IV, traMADol Assessment/Plan: Principal  Problem:  *COPD (chronic obstructive pulmonary disease) with acute bronchitis, improved Active Problems:  Acute-on-chronic respiratory failure, improved  CHF (congestive heart failure), compensated  Hypercapnia, likely chronic  Muscular dystrophy  Hypokalemia, resolved  Hyponatremia, resolved  headache related to neck spasm  Patient's respiratory status is much improved. Will discontinue IV fluids and telemetry. Transferred to the MedSurg unit. Out of bed to chair every shift. Resume metolazone. Change in azithromycin and steroids to by mouth. Heating pad and muscle relaxants and pain medication for neck and head pain. Discharge tomorrow if continues to improve.   LOS: 3 days   Shalia Bartko L 11/15/2011, 8:44 AM

## 2011-11-16 DIAGNOSIS — J449 Chronic obstructive pulmonary disease, unspecified: Secondary | ICD-10-CM

## 2011-11-16 DIAGNOSIS — J9620 Acute and chronic respiratory failure, unspecified whether with hypoxia or hypercapnia: Secondary | ICD-10-CM

## 2011-11-16 MED ORDER — CEFUROXIME AXETIL 500 MG PO TABS: 500.0000 mg | ORAL_TABLET | Freq: Two times a day (BID) | ORAL | Status: DC

## 2011-11-16 MED ORDER — CEFUROXIME AXETIL 250 MG PO TABS
500.0000 mg | ORAL_TABLET | Freq: Once | ORAL | Status: AC
  Administered 2011-11-16: 500 mg via ORAL
  Filled 2011-11-16: qty 2

## 2011-11-16 MED ORDER — AZITHROMYCIN 250 MG PO TABS
250.0000 mg | ORAL_TABLET | Freq: Every day | ORAL | Status: DC
  Administered 2011-11-16: 250 mg via ORAL
  Filled 2011-11-16: qty 1

## 2011-11-16 MED ORDER — PREDNISONE (PAK) 10 MG PO TABS: 10.0000 mg | ORAL_TABLET | Freq: Every day | ORAL | Status: DC

## 2011-11-16 MED ORDER — ACETAMINOPHEN 325 MG PO TABS
650.0000 mg | ORAL_TABLET | Freq: Once | ORAL | Status: AC
  Administered 2011-11-16: 650 mg via ORAL
  Filled 2011-11-16: qty 2

## 2011-11-16 NOTE — Clinical Social Work Note (Signed)
Clinical Social Work Department BRIEF PSYCHOSOCIAL ASSESSMENT 11/16/2011  Patient:  Karen Collier,Karen Collier     Account Number:  [id number]     Admit date:  11/12/2011  Clinical Social Worker:  Ellis Koffler, LCSW  Date/Time:  11/16/2011 10:50 AM  Referred by:  Physician  Date Referred:  11/16/2011 Referred for  ALF Placement   Other Referral:   Interview type:  Patient Other interview type:   POA-[name]    PSYCHOSOCIAL DATA Living Status:  FACILITY Admitted from facility:  [facility] Level of care:  Assisted Living Primary support name:  [name] Primary support relationship to patient:  FRIEND Degree of support available:   supportive per pt.    CURRENT CONCERNS Current Concerns  Post-Acute Placement   Other Concerns:    SOCIAL WORK ASSESSMENT / PLAN Psychosocial not completed over weekend when admitted.  CSW met with pt and pt's POA [name] at bedside.  Pt alert and oriented and reports she has been a resident at [facility] for over a year.  She states she plans to return at d/c and that things have been going well there.  [name] appears to be involved and supportive.  PT evaluated pt and recommended SNF. Tammy at [facility] assessed pt this morning and reports she is at baseline.  Home health PT set up by CM.  Pt on chronic oxygen.  Pt d/c today by MD. Catherine, RN reviewed FL2 for accuracy. D/C summary provided to Tammy.  Pt will transfer with [name] back to facility.   Assessment/plan status:  No Further Intervention Required Other assessment/ plan:   Information/referral to community resources:   [facility]  CM for home health    PATIENT'S/FAMILY'S RESPONSE TO PLAN OF CARE: Pt reports positive feelings regarding return to [facility] at d/c. Pt's POA, [name] also agreeable.         Meha Vidrine, LCSW [phone] 

## 2011-11-16 NOTE — Discharge Summary (Signed)
Physician Discharge Summary  Patient ID: Karen Collier MRN: [id number] DOB/AGE: [birth year] 66 y.o.  Admit date: 11/12/2011 Discharge date: 11/16/2011  Discharge Diagnoses:  Principal Problem:  *COPD (chronic obstructive pulmonary disease) with acute bronchitis Active Problems:  Acute-on-chronic respiratory failure  CHF (congestive heart failure)  Hypercapnia  Muscular dystrophy  Hypokalemia  Hyponatremia   Medication List  As of 11/16/2011  8:38 AM   TAKE these medications         albuterol (2.5 MG/3ML) 0.083% nebulizer solution   Commonly known as: PROVENTIL   Take 2.5 mg by nebulization 3 (three) times daily.      ALIGN 4 MG Caps   Take 1 capsule by mouth daily.      baclofen 10 MG tablet   Commonly known as: LIORESAL   Take 10 mg by mouth 2 (two) times daily.      CALCIUM 600 + D PO   Take 1 tablet by mouth daily.      carisoprodol 350 MG tablet   Commonly known as: SOMA   Take 350 mg by mouth every 8 (eight) hours.      carvedilol 12.5 MG tablet   Commonly known as: COREG   Take 12.5 mg by mouth 2 (two) times daily.      cefUROXime 500 MG tablet   Commonly known as: CEFTIN   Take 1 tablet (500 mg total) by mouth 2 (two) times daily.      clotrimazole 1 % cream   Commonly known as: LOTRIMIN   Apply 1 application topically 2 (two) times daily.      Daily Vite Tabs   Take 1 tablet by mouth daily.      docusate sodium 100 MG capsule   Commonly known as: COLACE   Take 100 mg by mouth 2 (two) times daily.      furosemide 20 MG tablet   Commonly known as: LASIX   Take 20 mg by mouth daily.      latanoprost 0.005 % ophthalmic solution   Commonly known as: XALATAN   Place 1 drop into both eyes at bedtime.      LORazepam 1 MG tablet   Commonly known as: ATIVAN   Take 1 mg by mouth 3 (three) times daily.      meclizine 25 MG tablet   Commonly known as: ANTIVERT   Take 25 mg by mouth daily.      metolazone 5 MG tablet   Commonly known as: ZAROXOLYN   Take  5 mg by mouth every other day.      potassium chloride SA 20 MEQ tablet   Commonly known as: K-DUR,KLOR-CON   Take 20 mEq by mouth 2 (two) times daily.      predniSONE 10 MG tablet   Commonly known as: STERAPRED UNI-PAK   Take 1 tablet (10 mg total) by mouth daily. 4 tablets daily then decrease by 1 tablet every 2 days until off      simvastatin 20 MG tablet   Commonly known as: ZOCOR   Take 20 mg by mouth at bedtime.      traMADol 50 MG tablet   Commonly known as: ULTRAM   Take 50-100 mg by mouth every 6 (six) hours as needed. pain          continue 2 liters home oxygen by n.c.  Discharge Orders    Future Orders Please Complete By Expires   Diet - low sodium heart healthy      Walk  with assistance         Follow-up Information    Follow up with DOONQUAH,KOFI, MD in 4 weeks. (for reported tremmors)    Contact information:   [address] [place] [address] [phone]          Disposition: [facility] Term Care  Discharged Condition: stable  Consults: Treatment Team:  Fredirick Maudlin, MD  Labs:    Sample type        ARTERIAL       Delivery systems        NASAL CANNULA       O2 Content        3.0       pH, Arterial        7.361       pCO2 arterial        74.3        pO2, Arterial        67.4       Bicarbonate        41.1       TCO2        37.5       Acid-Base Excess        15.0       O2 Saturation        92.1       Patient temperature        37.0       Collection site        RIGHT RADIAL       Allens test (pass/fail)        PASS        CHEM PROFILE    Sodium        128 133 135     Potassium        3.1 4.3  4.8     Chloride        82 89  93     CO2        39 37 38     Mean Plasma Glucose         114      BUN        5 10 12      Creatinine, Ser        0.20 0.22 0.22     Calcium        9.5 9.3 9.3     GFR calc non Af Amer        90 mL/min">90 90 mL/min">90 90 mL/min">90     GFR calc Af Amer        90 mL/min  The eGFR has  been calculated using the CKD EPI equation. This calculation has not been validated in all clinical situations. eGFR's persistently <90 mL/min signify possible Chronic Kidney Disease.">9090 mL/min  The eGFR has been calculated using the CKD EPI equation. This calculation has not been validated in all clinical situations. eGFR's persistently <90 mL/min signify possible Chronic Kidney Disease." border=0 src="file:///C:/PROGRAM%20FILES%20(X86)/EPICSYS/V7.8/EN-US/Images/IP_COMMENT_EXIST.gif" width=5 height=10 90 mL/min  The eGFR has been calculated using the CKD EPI equation. This calculation has not been validated in all clinical situations. eGFR's persistently <90 mL/min signify possible Chronic Kidney Disease.">9090 mL/min  The eGFR has been calculated using the CKD EPI equation. This calculation has not been validated in all clinical situations. eGFR's persistently <90 mL/min signify possible Chronic Kidney Disease." border=0 src="file:///C:/PROGRAM%20FILES%20(X86)/EPICSYS/V7.8/EN-US/Images/IP_COMMENT_EXIST.gif" width=5 height=10 90 mL/min  The eGFR has been calculated using the CKD EPI equation. This calculation has not been  validated in all clinical situations. eGFR's persistently <90 mL/min signify possible Chronic Kidney Disease.">9090 mL/min  The eGFR has been calculated using the CKD EPI equation. This calculation has not been validated in all clinical situations. eGFR's persistently <90 mL/min signify possible Chronic Kidney Disease." border=0 src="file:///C:/PROGRAM%20FILES%20(X86)/EPICSYS/V7.8/EN-US/Images/IP_COMMENT_EXIST.gif" width=5 height=10     Glucose, Bld        163 164 147     Magnesium        1.7       Alkaline Phosphatase        93       Albumin        3.4       AST        21       ALT        14       Total Protein        7.6       Total Bilirubin        0.3        CARDIAC PROFILE    Troponin I        <0.30        Pro B Natriuretic peptide (BNP)        490.8        CBC     WBC        6.1 6.7      RBC        4.25 4.17      Hemoglobin        12.0 11.9      HCT        36.2 36.7      MCV        85.2 88.0      MCH        28.2 28.5      MCHC        33.1 32.4      RDW        14.4 14.3      Platelets        152 185       DIFFERENTIAL    Neutrophils Relative        83       Lymphocytes Relative        9       Monocytes Relative        5       Eosinophils Relative        3       Basophils Relative        0       Neutro Abs        8.4       Lymphs Abs        0.9       Monocytes Absolute        0.6       Eosinophils Absolute        0.3       Basophils Absolute        0.0        DIABETES    Hemoglobin A1C         =6.5% Diagnostic of Diabetes Mellitus (if abnormal result is confirmed) 5.7-6.4% Increased risk of developing Diabetes Mellitus References:Diagnosis and Classification of Diabetes Mellitus,Diabetes Care,2011,34(Suppl 1):S62-S69 and Standards of Medical Care in .Marland KitchenMarland Kitchen"5.6 =6.5% Diagnostic of Diabetes Mellitus (if abnormal result is confirmed) 5.7-6.4% Increased risk of developing Diabetes Mellitus References:Diagnosis and Classification of Diabetes Mellitus,Diabetes Care,2011,34(Suppl 1):S62-S69 and Standards of  Medical Care in ..." border=0 src="file:///C:/PROGRAM%20FILES%20(X86)/EPICSYS/V7.8/EN-US/Images/IP_COMMENT_EXIST.gif" width=5 height=10      Glucose, Bld        163 164 147      THYROID    TSH        0.699       Free T4         1.41       Diagnostics:  Dg Chest Port 1 View  11/12/2011  *RADIOLOGY REPORT*  Clinical Data: Severe shortness of breath for past 2 days.  PORTABLE CHEST - 1 VIEW  Comparison: Chest x-ray 10/06/2010.  Findings: Lung volumes are very low.  Again noted is gaseous distension of the stomach and mild elevation of the left hemidiaphragm.  There are bibasilar opacities, favored to represent areas of bibasilar subsegmental atelectasis or scarring.  No definite consolidative airspace disease.  No definite pleural effusions.  Cardiac  silhouette is nearly completely obscured. Mediastinal contours are unremarkable.  IMPRESSION: 1.  Low volume chest with probable bibasilar subsegmental atelectasis, and no definite radiographic evidence of acute cardiopulmonary disease.  Original Report Authenticated By: Florencia Reasons, M.D.   EKG: Sinus tachycardia with LVH.  Full Code   Hospital Course: See H&P for complete admission details. The patient is a pleasant 66 year old white female with history of chronic hypoxic respiratory failure on home oxygen, COPD, and muscular dystrophy. She presented with shortness of breath for several days. She resides permanently at assisted living and in the field was noted to have hypoxia into the 50s on 2 L of oxygen. In the emergency room, she was found to be afebrile. Respiratory rate was 24. Oxygen saturation 90% on high flow oxygen. She had tachypnea and rhonchi bilaterally. Chest x-ray showed atelectasis and no definite infiltrate. She was admitted to the hospitalist service and started on antibiotics for acute bronchitis, steroids for COPD exacerbation, bronchodilators and her oxygen was titrated for saturations greater than 90. By the time of discharge, her lungs were clear, her vital signs were stable, her oxygen saturations were normal on 2 L of oxygen. She is followed by Dr. Juanetta Gosling as an outpatient and he was consulted and made recommendations. She reportedly has a history of CHF, but no evidence of exacerbation during this hospitalization. She is stable to return back to [facility].  During the hospitalization she reported shaking spells/tremor for several weeks. I witnessed none, but recommend outpatient followup with her neurologist, Dr. Gerilyn Pilgrim after resolution of her pulmonary issues. Total time on the day of discharge greater than 30 minutes.  Discharge Exam:  Blood pressure 132/84, pulse 100, temperature 98.8 F (37.1 C), temperature source Oral, resp. rate 20,  height 5\' 7"  (1.702 m), weight 95.2 kg (209 lb 14.1 oz), SpO2 98.00%.  Exam unchanged from 11/15/2011  Signed: Crista Curb L 11/16/2011, 8:38 AM

## 2011-11-16 NOTE — Care Management Note (Signed)
    Page 1 of 1   11/16/2011     11:47:27 AM   CARE MANAGEMENT NOTE 11/16/2011  Patient:  Karen Collier, Karen Collier   Account Number:  [id number]  Date Initiated:  11/16/2011  Documentation initiated by:  Rosemary Holms  Subjective/Objective Assessment:   Pt admitted from [facility] with COPD exascerbation. On chronic O2.     Action/Plan:   Pt to be DC'd back to [facility]. Pt needs HH PT and selected [facility].   Anticipated DC Date:  11/16/2011   Anticipated DC Plan:  ASSISTED LIVING / REST HOME      DC Planning Services  CM consult      Choice offered to / List presented to:          [facility] arranged  HH-2 PT      [facility] agency  [facility]   Status of service:  Completed, signed off Medicare Important Message given?   (If response is "NO", the following Medicare IM given date fields will be blank) Date Medicare IM given:   Date Additional Medicare IM given:    Discharge Disposition:  ASSISTED LIVING  Per UR Regulation:    If discussed at Long Length of Stay Meetings, dates discussed:    Comments:  11/16/11 1100 Sheina Mcleish RN BSN CM Pt set up with HHPT with [facility].

## 2011-11-16 NOTE — Progress Notes (Signed)
Physical Therapy Treatment Patient Details Name: Karen Collier MRN: [id number] DOB: Aug 04, 1945 Today's Date: 11/16/2011 Time: 4098-1191 PT Time Calculation (min): 23 min  PT Assessment / Plan / Recommendation Comments on Treatment Session  Patient able to complete all bed exercises well with exception of bridging;required only Min A with Bed mobility which was due to mechanical malfunction of bed. Patient also completed 3' of gait training with RW and Min guard bed<>recliner.     Follow Up Recommendations       Barriers to Discharge        Equipment Recommendations       Recommendations for Other Services    Frequency     Plan      Precautions / Restrictions     Pertinent Vitals/Pain     Mobility  Bed Mobility Bed Mobility: Sitting - Scoot to Edge of Bed Sitting - Scoot to Edge of Bed: 6: Modified independent (Device/Increase time) Sit to Supine: 4: Min assist Details for Bed Mobility Assistance: bed not flat due to mechanical issues;pt needed assistance over hump in middle of bed Transfers Sit to Stand: 4: Min assist Stand to Sit: 4: Min assist Ambulation/Gait Ambulation/Gait Assistance: 4: Min guard Ambulation Distance (Feet): 3 Feet Assistive device: Rolling walker Gait Pattern: Trunk flexed;Shuffle Gait velocity: slow Stairs: No Wheelchair Mobility Wheelchair Mobility: No    Exercises General Exercises - Lower Extremity Ankle Circles/Pumps: Both;20 reps Quad Sets: Both;10 reps Gluteal Sets: 10 reps Short Arc Quad: Both;10 reps Heel Slides: Both;10 reps Hip ABduction/ADduction: 10 reps;Both Straight Leg Raises: Both;10 reps   PT Diagnosis:    PT Problem List:   PT Treatment Interventions:     PT Goals Acute Rehab PT Goals PT Goal: Sit to Stand - Progress: Met PT Goal: Stand to Sit - Progress: Met PT Transfer Goal: Bed to Chair/Chair to Bed - Progress: Met PT Goal: Stand - Progress: Met  Visit Information  Last PT Received On: 11/16/11    Subjective  Data      Cognition       Balance     End of Session PT - End of Session Equipment Utilized During Treatment: Gait belt Activity Tolerance: Patient tolerated treatment well Patient left: in chair;with call bell/phone within reach   GP     [name] 11/16/2011, 9:13 AM

## 2011-11-16 NOTE — Progress Notes (Signed)
Pt with orders to be discharged back to [facility]. Discharge instructions and packet given to pt's POA. Pt in stable condition upon discharge. Pt left via private vehicle with POA. POA is taking pt to [facility].

## 2011-11-20 ENCOUNTER — Encounter (HOSPITAL_COMMUNITY): Payer: Self-pay | Admitting: *Deleted

## 2011-11-20 ENCOUNTER — Inpatient Hospital Stay (HOSPITAL_COMMUNITY)
Admission: EM | Admit: 2011-11-20 | Discharge: 2011-11-23 | DRG: 190 | Payer: Medicare Other | Attending: Internal Medicine | Admitting: Internal Medicine

## 2011-11-20 ENCOUNTER — Emergency Department (HOSPITAL_COMMUNITY): Payer: Medicare Other

## 2011-11-20 DIAGNOSIS — IMO0002 Reserved for concepts with insufficient information to code with codable children: Secondary | ICD-10-CM

## 2011-11-20 DIAGNOSIS — E041 Nontoxic single thyroid nodule: Secondary | ICD-10-CM

## 2011-11-20 DIAGNOSIS — Z79899 Other long term (current) drug therapy: Secondary | ICD-10-CM

## 2011-11-20 DIAGNOSIS — J209 Acute bronchitis, unspecified: Secondary | ICD-10-CM | POA: Diagnosis present

## 2011-11-20 DIAGNOSIS — J440 Chronic obstructive pulmonary disease with acute lower respiratory infection: Secondary | ICD-10-CM

## 2011-11-20 DIAGNOSIS — J441 Chronic obstructive pulmonary disease with (acute) exacerbation: Principal | ICD-10-CM

## 2011-11-20 DIAGNOSIS — T380X5A Adverse effect of glucocorticoids and synthetic analogues, initial encounter: Secondary | ICD-10-CM | POA: Diagnosis present

## 2011-11-20 DIAGNOSIS — Z9981 Dependence on supplemental oxygen: Secondary | ICD-10-CM

## 2011-11-20 DIAGNOSIS — Z801 Family history of malignant neoplasm of trachea, bronchus and lung: Secondary | ICD-10-CM

## 2011-11-20 DIAGNOSIS — E873 Alkalosis: Secondary | ICD-10-CM

## 2011-11-20 DIAGNOSIS — I10 Essential (primary) hypertension: Secondary | ICD-10-CM | POA: Diagnosis present

## 2011-11-20 DIAGNOSIS — J9612 Chronic respiratory failure with hypercapnia: Secondary | ICD-10-CM

## 2011-11-20 DIAGNOSIS — J189 Pneumonia, unspecified organism: Secondary | ICD-10-CM

## 2011-11-20 DIAGNOSIS — R911 Solitary pulmonary nodule: Secondary | ICD-10-CM

## 2011-11-20 DIAGNOSIS — R0689 Other abnormalities of breathing: Secondary | ICD-10-CM

## 2011-11-20 DIAGNOSIS — Z8679 Personal history of other diseases of the circulatory system: Secondary | ICD-10-CM

## 2011-11-20 DIAGNOSIS — R0609 Other forms of dyspnea: Secondary | ICD-10-CM

## 2011-11-20 DIAGNOSIS — R0600 Dyspnea, unspecified: Secondary | ICD-10-CM

## 2011-11-20 DIAGNOSIS — J9811 Atelectasis: Secondary | ICD-10-CM

## 2011-11-20 DIAGNOSIS — J45901 Unspecified asthma with (acute) exacerbation: Principal | ICD-10-CM | POA: Diagnosis present

## 2011-11-20 DIAGNOSIS — G7109 Other specified muscular dystrophies: Secondary | ICD-10-CM | POA: Diagnosis present

## 2011-11-20 DIAGNOSIS — Y95 Nosocomial condition: Secondary | ICD-10-CM | POA: Diagnosis present

## 2011-11-20 DIAGNOSIS — E876 Hypokalemia: Secondary | ICD-10-CM

## 2011-11-20 DIAGNOSIS — J9620 Acute and chronic respiratory failure, unspecified whether with hypoxia or hypercapnia: Secondary | ICD-10-CM

## 2011-11-20 DIAGNOSIS — R7309 Other abnormal glucose: Secondary | ICD-10-CM | POA: Diagnosis present

## 2011-11-20 DIAGNOSIS — J9819 Other pulmonary collapse: Secondary | ICD-10-CM | POA: Diagnosis present

## 2011-11-20 DIAGNOSIS — I509 Heart failure, unspecified: Secondary | ICD-10-CM | POA: Diagnosis present

## 2011-11-20 DIAGNOSIS — G7100 Muscular dystrophy, unspecified: Secondary | ICD-10-CM

## 2011-11-20 DIAGNOSIS — J9610 Chronic respiratory failure, unspecified whether with hypoxia or hypercapnia: Secondary | ICD-10-CM | POA: Diagnosis present

## 2011-11-20 DIAGNOSIS — E871 Hypo-osmolality and hyponatremia: Secondary | ICD-10-CM

## 2011-11-20 HISTORY — DX: Solitary pulmonary nodule: R91.1

## 2011-11-20 HISTORY — DX: Chronic respiratory failure with hypercapnia: J96.12

## 2011-11-20 HISTORY — DX: Alkalosis: E87.3

## 2011-11-20 HISTORY — DX: Hypo-osmolality and hyponatremia: E87.1

## 2011-11-20 HISTORY — DX: Atelectasis: J98.11

## 2011-11-20 HISTORY — DX: Pneumonia, unspecified organism: J18.9

## 2011-11-20 HISTORY — DX: Nontoxic single thyroid nodule: E04.1

## 2011-11-20 LAB — URINALYSIS, ROUTINE W REFLEX MICROSCOPIC
Bilirubin Urine: NEGATIVE
Glucose, UA: 250 mg/dL — AB
Hgb urine dipstick: NEGATIVE
Specific Gravity, Urine: 1.01 (ref 1.005–1.030)
Urobilinogen, UA: 0.2 mg/dL (ref 0.0–1.0)
pH: 7 (ref 5.0–8.0)

## 2011-11-20 LAB — CBC WITH DIFFERENTIAL/PLATELET
Basophils Absolute: 0 10*3/uL (ref 0.0–0.1)
Basophils Relative: 0 % (ref 0–1)
Eosinophils Absolute: 0 10*3/uL (ref 0.0–0.7)
Hemoglobin: 13.4 g/dL (ref 12.0–15.0)
MCH: 28.5 pg (ref 26.0–34.0)
MCHC: 33.3 g/dL (ref 30.0–36.0)
Monocytes Relative: 4 % (ref 3–12)
Neutrophils Relative %: 90 % — ABNORMAL HIGH (ref 43–77)
RDW: 14.2 % (ref 11.5–15.5)

## 2011-11-20 LAB — BASIC METABOLIC PANEL
BUN: 13 mg/dL (ref 6–23)
Creatinine, Ser: 0.4 mg/dL — ABNORMAL LOW (ref 0.50–1.10)
GFR calc Af Amer: >90 mL/min (ref >90)
GFR calc non Af Amer: >90 mL/min (ref >90)
Potassium: 3.3 mEq/L — ABNORMAL LOW (ref 3.5–5.1)

## 2011-11-20 LAB — BLOOD GAS, ARTERIAL
Acid-Base Excess: 16.4 mmol/L — ABNORMAL HIGH (ref 0.0–2.0)
Bicarbonate: 42 mEq/L — ABNORMAL HIGH (ref 20.0–24.0)
O2 Content: 3 L/min
O2 Saturation: 95.7 %
pO2, Arterial: 81.3 mmHg (ref 80.0–100.0)

## 2011-11-20 LAB — LACTIC ACID, PLASMA: Lactic Acid, Venous: 1 mmol/L (ref 0.5–2.2)

## 2011-11-20 MED ORDER — IOHEXOL 350 MG/ML SOLN
85.0000 mL | Freq: Once | INTRAVENOUS | Status: AC | PRN
  Administered 2011-11-20: 85 mL via INTRAVENOUS

## 2011-11-20 MED ORDER — ACETYLCYSTEINE 20 % IN SOLN
2.0000 mL | Freq: Three times a day (TID) | RESPIRATORY_TRACT | Status: DC
  Filled 2011-11-20 (×3): qty 4

## 2011-11-20 MED ORDER — POTASSIUM CHLORIDE CRYS ER 20 MEQ PO TBCR
30.0000 meq | EXTENDED_RELEASE_TABLET | Freq: Two times a day (BID) | ORAL | Status: AC
  Administered 2011-11-20 – 2011-11-21 (×2): 30 meq via ORAL
  Filled 2011-11-20 (×2): qty 1

## 2011-11-20 MED ORDER — ACETAMINOPHEN 325 MG PO TABS: 650.0000 mg | ORAL_TABLET | Freq: Four times a day (QID) | ORAL | Status: DC | PRN

## 2011-11-20 MED ORDER — SODIUM CHLORIDE 0.9 % IV SOLN
INTRAVENOUS | Status: DC
  Administered 2011-11-20: 16:00:00 via INTRAVENOUS

## 2011-11-20 MED ORDER — ENOXAPARIN SODIUM 40 MG/0.4ML ~~LOC~~ SOLN
40.0000 mg | SUBCUTANEOUS | Status: DC
  Administered 2011-11-21 – 2011-11-23 (×3): 40 mg via SUBCUTANEOUS
  Filled 2011-11-20 (×3): qty 0.4

## 2011-11-20 MED ORDER — BACLOFEN 10 MG PO TABS
5.0000 mg | ORAL_TABLET | Freq: Two times a day (BID) | ORAL | Status: DC
  Administered 2011-11-20 – 2011-11-22 (×4): 5 mg via ORAL
  Filled 2011-11-20 (×8): qty 1

## 2011-11-20 MED ORDER — INSULIN ASPART 100 UNIT/ML ~~LOC~~ SOLN
0.0000 [IU] | Freq: Every day | SUBCUTANEOUS | Status: DC
  Administered 2011-11-20: 3 [IU] via SUBCUTANEOUS
  Administered 2011-11-22: 2 [IU] via SUBCUTANEOUS

## 2011-11-20 MED ORDER — ONDANSETRON HCL 4 MG/2ML IJ SOLN: 4.0000 mg | Freq: Four times a day (QID) | INTRAMUSCULAR | Status: DC | PRN

## 2011-11-20 MED ORDER — CARVEDILOL 3.125 MG PO TABS
6.2500 mg | ORAL_TABLET | Freq: Two times a day (BID) | ORAL | Status: DC
  Administered 2011-11-21 – 2011-11-23 (×5): 6.25 mg via ORAL
  Filled 2011-11-20 (×4): qty 2
  Filled 2011-11-20 (×2): qty 1
  Filled 2011-11-20: qty 2
  Filled 2011-11-20: qty 1

## 2011-11-20 MED ORDER — LATANOPROST 0.005 % OP SOLN
1.0000 [drp] | Freq: Every day | OPHTHALMIC | Status: DC
  Administered 2011-11-21 – 2011-11-22 (×2): 1 [drp] via OPHTHALMIC
  Filled 2011-11-20: qty 2.5

## 2011-11-20 MED ORDER — INSULIN ASPART 100 UNIT/ML ~~LOC~~ SOLN
0.0000 [IU] | Freq: Three times a day (TID) | SUBCUTANEOUS | Status: DC
  Administered 2011-11-21 (×2): 1 [IU] via SUBCUTANEOUS
  Administered 2011-11-22: 2 [IU] via SUBCUTANEOUS
  Administered 2011-11-22: 1 [IU] via SUBCUTANEOUS

## 2011-11-20 MED ORDER — METHYLPREDNISOLONE SODIUM SUCC 125 MG IJ SOLR
125.0000 mg | Freq: Once | INTRAMUSCULAR | Status: AC
  Administered 2011-11-20: 125 mg via INTRAVENOUS
  Filled 2011-11-20: qty 2

## 2011-11-20 MED ORDER — ALBUTEROL SULFATE (5 MG/ML) 0.5% IN NEBU
2.5000 mg | INHALATION_SOLUTION | RESPIRATORY_TRACT | Status: DC
  Administered 2011-11-20 – 2011-11-23 (×13): 2.5 mg via RESPIRATORY_TRACT
  Filled 2011-11-20 (×13): qty 0.5

## 2011-11-20 MED ORDER — BACLOFEN 10 MG PO TABS: 10.0000 mg | ORAL_TABLET | Freq: Two times a day (BID) | ORAL | Status: DC

## 2011-11-20 MED ORDER — GUAIFENESIN ER 600 MG PO TB12
600.0000 mg | ORAL_TABLET | Freq: Two times a day (BID) | ORAL | Status: DC
  Administered 2011-11-20 – 2011-11-23 (×6): 600 mg via ORAL
  Filled 2011-11-20 (×10): qty 1

## 2011-11-20 MED ORDER — MOXIFLOXACIN HCL IN NACL 400 MG/250ML IV SOLN
400.0000 mg | INTRAVENOUS | Status: DC
  Administered 2011-11-21 – 2011-11-22 (×2): 400 mg via INTRAVENOUS
  Filled 2011-11-20 (×3): qty 250

## 2011-11-20 MED ORDER — IPRATROPIUM BROMIDE 0.02 % IN SOLN
1.0000 mg | Freq: Once | RESPIRATORY_TRACT | Status: AC
  Administered 2011-11-20: 1 mg via RESPIRATORY_TRACT
  Filled 2011-11-20: qty 5

## 2011-11-20 MED ORDER — POTASSIUM CHLORIDE IN NACL 20-0.9 MEQ/L-% IV SOLN
INTRAVENOUS | Status: DC
  Administered 2011-11-20 – 2011-11-21 (×3): via INTRAVENOUS

## 2011-11-20 MED ORDER — IOHEXOL 350 MG/ML SOLN
65.0000 mL | Freq: Once | INTRAVENOUS | Status: AC | PRN
  Administered 2011-11-20: 65 mL via INTRAVENOUS

## 2011-11-20 MED ORDER — ACETAMINOPHEN 650 MG RE SUPP: 650.0000 mg | Freq: Four times a day (QID) | RECTAL | Status: DC | PRN

## 2011-11-20 MED ORDER — METHYLPREDNISOLONE SODIUM SUCC 125 MG IJ SOLR
60.0000 mg | Freq: Two times a day (BID) | INTRAMUSCULAR | Status: DC
  Administered 2011-11-21 – 2011-11-22 (×3): 60 mg via INTRAVENOUS
  Filled 2011-11-20 (×3): qty 2

## 2011-11-20 MED ORDER — VANCOMYCIN HCL IN DEXTROSE 1-5 GM/200ML-% IV SOLN
1000.0000 mg | INTRAVENOUS | Status: AC
  Administered 2011-11-20: 1000 mg via INTRAVENOUS
  Filled 2011-11-20: qty 200

## 2011-11-20 MED ORDER — ALUM & MAG HYDROXIDE-SIMETH 200-200-20 MG/5ML PO SUSP: 30.0000 mL | Freq: Four times a day (QID) | ORAL | Status: DC | PRN

## 2011-11-20 MED ORDER — SIMVASTATIN 20 MG PO TABS
20.0000 mg | ORAL_TABLET | Freq: Every day | ORAL | Status: DC
  Administered 2011-11-20 – 2011-11-22 (×3): 20 mg via ORAL
  Filled 2011-11-20 (×5): qty 1

## 2011-11-20 MED ORDER — IPRATROPIUM BROMIDE 0.02 % IN SOLN
0.5000 mg | RESPIRATORY_TRACT | Status: DC
  Administered 2011-11-20 – 2011-11-23 (×13): 0.5 mg via RESPIRATORY_TRACT
  Filled 2011-11-20 (×13): qty 2.5

## 2011-11-20 MED ORDER — RISAQUAD PO CAPS
1.0000 | ORAL_CAPSULE | Freq: Every day | ORAL | Status: DC
  Administered 2011-11-21 – 2011-11-23 (×3): 1 via ORAL
  Filled 2011-11-20 (×4): qty 1

## 2011-11-20 MED ORDER — ONDANSETRON HCL 4 MG PO TABS: 4.0000 mg | ORAL_TABLET | Freq: Four times a day (QID) | ORAL | Status: DC | PRN

## 2011-11-20 MED ORDER — OXYCODONE HCL 5 MG PO TABS: 5.0000 mg | ORAL_TABLET | ORAL | Status: DC | PRN

## 2011-11-20 MED ORDER — LORAZEPAM 0.5 MG PO TABS
0.5000 mg | ORAL_TABLET | Freq: Three times a day (TID) | ORAL | Status: DC
  Administered 2011-11-20 – 2011-11-22 (×5): 0.5 mg via ORAL
  Filled 2011-11-20 (×5): qty 1

## 2011-11-20 MED ORDER — FAMOTIDINE 20 MG PO TABS
20.0000 mg | ORAL_TABLET | Freq: Every day | ORAL | Status: DC
  Administered 2011-11-21 – 2011-11-23 (×3): 20 mg via ORAL
  Filled 2011-11-20 (×3): qty 1

## 2011-11-20 MED ORDER — ALBUTEROL SULFATE (5 MG/ML) 0.5% IN NEBU
10.0000 mg | INHALATION_SOLUTION | Freq: Once | RESPIRATORY_TRACT | Status: AC
  Administered 2011-11-20: 10 mg via RESPIRATORY_TRACT
  Filled 2011-11-20: qty 2

## 2011-11-20 MED ORDER — DOCUSATE SODIUM 100 MG PO CAPS
100.0000 mg | ORAL_CAPSULE | Freq: Two times a day (BID) | ORAL | Status: DC
  Administered 2011-11-20 – 2011-11-23 (×6): 100 mg via ORAL
  Filled 2011-11-20 (×10): qty 1

## 2011-11-20 MED ORDER — VANCOMYCIN HCL IN DEXTROSE 1-5 GM/200ML-% IV SOLN
INTRAVENOUS | Status: AC
  Filled 2011-11-20: qty 200

## 2011-11-20 MED ORDER — VANCOMYCIN HCL IN DEXTROSE 1-5 GM/200ML-% IV SOLN
1000.0000 mg | Freq: Two times a day (BID) | INTRAVENOUS | Status: DC
  Administered 2011-11-21 – 2011-11-22 (×2): 1000 mg via INTRAVENOUS
  Filled 2011-11-20 (×5): qty 200

## 2011-11-20 NOTE — ED Provider Notes (Signed)
History   This chart was scribed for Karen Anger, DO by Charolett Bumpers . The patient was seen in room APA04/[facility]04.    CSN: [id number]  Arrival date & time 11/20/11  1515   First MD Initiated Contact with Patient 11/20/11 1533      Chief Complaint  Patient presents with  . Cough  . Shortness of Breath     HPI Pt seen at 1540.  Karen Collier is a 66 y.o. female who presents to the Emergency Department complaining of gradual onset and worsening of persistent cough for the past week.  Has been associated with SOB. Patient states that her cough has been non-productive. Patient states that she was in the ICU here at [facility] for the same symptoms last weekend, dx bronchitis, and was discharged Tuesday. Patient endorses she has been using her nebulizer 3 times daily, taking abx and Prednisone as prescribed without relief. Patient states that she is always on 2.5 L of O2 at home.  Denies fevers, no CP/palpitations, no back pain, no abd pain, no N/V/D.    PCP: Dr. Dwana Melena  Past Medical History  Diagnosis Date  . Muscular dystrophy   . COPD (chronic obstructive pulmonary disease)   . Asthma   . Hypoxemia   . CHF (congestive heart failure)   . Chronic pain   . Hypertension   . Glaucoma   . Muscle weakness     Past Surgical History  Procedure Date  . Unable   . Cholecystectomy   . Abdominal hysterectomy    History  Substance Use Topics  . Smoking status: Never Smoker   . Smokeless tobacco: Not on file  . Alcohol Use: No    Review of Systems ROS: Statement: All systems negative except as marked or noted in the HPI; Constitutional: Negative for fever and chills. ; ; Eyes: Negative for eye pain, redness and discharge. ; ; ENMT: Negative for ear pain, hoarseness, nasal congestion, sinus pressure and sore throat. ; ; Cardiovascular: Negative for chest pain, palpitations, diaphoresis, and peripheral edema. ; ; Respiratory: +cough, wheezing, SOB. Negative for stridor. ; ;  Gastrointestinal: Negative for nausea, vomiting, diarrhea, abdominal pain, blood in stool, hematemesis, jaundice and rectal bleeding. . ; ; Genitourinary: Negative for dysuria, flank pain and hematuria. ; ; Musculoskeletal: Negative for back pain and neck pain. Negative for swelling and trauma.; ; Skin: Negative for pruritus, rash, abrasions, blisters, bruising and skin lesion.; ; Neuro: Negative for headache, lightheadedness and neck stiffness. Negative for new weakness, altered level of consciousness , altered mental status, new extremity weakness, paresthesias, involuntary movement, seizure and syncope.     Allergies  Review of patient's allergies indicates no known allergies.  Home Medications   Current Outpatient Rx  Name Route Sig Dispense Refill  . ALBUTEROL SULFATE (2.5 MG/3ML) 0.083% IN NEBU Nebulization Take 2.5 mg by nebulization 3 (three) times daily.    Marland Kitchen BACLOFEN 10 MG PO TABS Oral Take 10 mg by mouth 2 (two) times daily.    Marland Kitchen CALCIUM 600 + D PO Oral Take 1 tablet by mouth daily.    Marland Kitchen CARISOPRODOL 350 MG PO TABS Oral Take 350 mg by mouth every 8 (eight) hours.     Marland Kitchen CARVEDILOL 12.5 MG PO TABS Oral Take 12.5 mg by mouth 2 (two) times daily.    Marland Kitchen CEFUROXIME AXETIL 500 MG PO TABS Oral Take 1 tablet (500 mg total) by mouth 2 (two) times daily. 6 tablet 0  . CLOTRIMAZOLE  1 % EX CREA Topical Apply 1 application topically 2 (two) times daily.    Marland Kitchen DOCUSATE SODIUM 100 MG PO CAPS Oral Take 100 mg by mouth 2 (two) times daily.    . FUROSEMIDE 20 MG PO TABS Oral Take 20 mg by mouth daily.    Marland Kitchen LATANOPROST 0.005 % OP SOLN Both Eyes Place 1 drop into both eyes at bedtime.    Marland Kitchen LORAZEPAM 1 MG PO TABS Oral Take 1 mg by mouth 3 (three) times daily.    Marland Kitchen MECLIZINE HCL 25 MG PO TABS Oral Take 25 mg by mouth daily.    Marland Kitchen METOLAZONE 5 MG PO TABS Oral Take 5 mg by mouth every other day.    Marland Kitchen DAILY VITE PO TABS Oral Take 1 tablet by mouth daily.    Marland Kitchen POTASSIUM CHLORIDE CRYS ER 20 MEQ PO TBCR Oral Take  20 mEq by mouth 2 (two) times daily.    Marland Kitchen PREDNISONE (PAK) 10 MG PO TABS Oral Take 1 tablet (10 mg total) by mouth daily. 4 tablets daily then decrease by 1 tablet every 2 days until off 20 tablet 0  . ALIGN 4 MG PO CAPS Oral Take 1 capsule by mouth daily.    Marland Kitchen SIMVASTATIN 20 MG PO TABS Oral Take 20 mg by mouth at bedtime.    . TRAMADOL HCL 50 MG PO TABS Oral Take 50-100 mg by mouth every 6 (six) hours as needed. pain      BP 150/76  Pulse 109  Temp 98.9 F (37.2 C) (Oral)  Resp 20  Ht 5\' 7"  (1.702 m)  Wt 209 lb (94.802 kg)  BMI 32.73 kg/m2  SpO2 93%  Physical Exam 1545: Physical examination:  Nursing notes reviewed; Vital signs and O2 SAT reviewed;  Constitutional: Well developed, Well nourished, In no acute distress; Head:  Normocephalic, atraumatic; Eyes: EOMI, PERRL, No scleral icterus; ENMT: Mouth and pharynx normal, Mucous membranes dry; ; Respiratory: Breath sounds coarse & equal bilaterally, with scattered wheezes.  Speaking full sentences with ease, no resp distress. Neck: Supple, Full range of motion, No lymphadenopathy; Cardiovascular: Tachycardic rate and rhythm, No murmur; Chest: Nontender, Movement normal; Abdomen: Soft, Nontender, Nondistended, Normal bowel sounds;; Extremities: Pulses normal, No tenderness, 2+ pedal edema bilat, No calf asymmetry.; Neuro: AA&Ox3, Major CN grossly intact.  Speech clear. +weakness bilat LE's per hx, otherwise no gross focal motor deficits in extremities.; Skin: Color normal, Warm, Dry.   ED Course  Procedures    MDM  MDM Reviewed: previous chart, nursing note and vitals Reviewed previous: ECG Interpretation: ECG, labs and x-ray    Date: 11/20/2011  Rate: 105  Rhythm: sinus tachycardia  QRS Axis: normal  Intervals: normal  ST/T Wave abnormalities: normal  Conduction Disutrbances:none  Narrative Interpretation:   Old EKG Reviewed: unchanged; no significant changes from previous EKG dated 11/12/2011.  Results for orders placed  during the hospital encounter of 11/20/11  PRO B NATRIURETIC PEPTIDE      Component Value Range   Pro B Natriuretic peptide (BNP) 58.8  0 - 125 pg/mL  TROPONIN I      Component Value Range   Troponin I <0.30  <0.30 ng/mL  CBC WITH DIFFERENTIAL      Component Value Range   WBC 17.8 (*) 4.0 - 10.5 K/uL   RBC 4.70  3.87 - 5.11 MIL/uL   Hemoglobin 13.4  12.0 - 15.0 g/dL   HCT 40.9  81.1 - 91.4 %   MCV 85.7  78.0 -  100.0 fL   MCH 28.5  26.0 - 34.0 pg   MCHC 33.3  30.0 - 36.0 g/dL   RDW 16.1  09.6 - 04.5 %   Platelets 207  150 - 400 K/uL   Neutrophils Relative 90 (*) 43 - 77 %   Neutro Abs 16.0 (*) 1.7 - 7.7 K/uL   Lymphocytes Relative 6 (*) 12 - 46 %   Lymphs Abs 1.1  0.7 - 4.0 K/uL   Monocytes Relative 4  3 - 12 %   Monocytes Absolute 0.7  0.1 - 1.0 K/uL   Eosinophils Relative 0  0 - 5 %   Eosinophils Absolute 0.0  0.0 - 0.7 K/uL   Basophils Relative 0  0 - 1 %   Basophils Absolute 0.0  0.0 - 0.1 K/uL  BASIC METABOLIC PANEL      Component Value Range   Sodium 123 (*) 135 - 145 mEq/L   Potassium 3.3 (*) 3.5 - 5.1 mEq/L   Chloride 76 (*) 96 - 112 mEq/L   CO2 43 (*) 19 - 32 mEq/L   Glucose, Bld 116 (*) 70 - 99 mg/dL   BUN 13  6 - 23 mg/dL   Creatinine, Ser 4.09 (*) 0.50 - 1.10 mg/dL   Calcium 9.6  8.4 - 81.1 mg/dL   GFR calc non Af Amer >90  >90 mL/min   GFR calc Af Amer >90  >90 mL/min  LACTIC ACID, PLASMA      Component Value Range   Lactic Acid, Venous 1.0  0.5 - 2.2 mmol/L  PROCALCITONIN      Component Value Range   Procalcitonin <0.05      Dg Chest 2 View 11/20/2011  *RADIOLOGY REPORT*  Clinical Data: Cough.  Congestive heart failure.  CHEST - 2 VIEW  Comparison: 11/12/2011  Findings: The hemidiaphragms are elevated.  There is bibasilar atelectasis.  The upper lungs are clear.  No apparent effusions. No free air seen under the diaphragm.  IMPRESSION: Elevated hemidiaphragms.  Basilar volume loss.  Original Report Authenticated By: Thomasenia Sales, M.D.    Results for  SELINDA, KORZENIEWSKI (MRN [id number]) as of 11/20/2011 17:41  Ref. Range 11/12/2011 19:53 11/13/2011 04:55 11/14/2011 05:21 11/15/2011 04:36 11/20/2011 15:57  Sodium Latest Range: 135-145 mEq/L 125 (L) 128 (L) 133 (L) 135 123 (L)  Chloride Latest Range: 96-112 mEq/L 79 (L) 82 (L) 89 (L) 93 (L) 76 (L)  CO2 Latest Range: 19-32 mEq/L 39 (H) 39 (H) 37 (H) 38 (H) 43 (HH)     1730:  Pt given IV solumedrol and hour long continuous neb.  Hyponatremic on today's labs compared to just 5 days ago.  Elevated WBC, but pt afebrile, normal lactic acid and procalcitonin; likely due to steroids.  Dx testing d/w pt and family.  Questions answered.  Verb understanding, agreeable to admit. T/C to [facility] Dr. Sherrie Mustache, case discussed, including:  HPI, pertinent PM/SHx, VS/PE, dx testing, ED course and treatment:  Agreeable to admit, requests to write temporary orders, obtain CT-A chest now, obtain stepdown bed to team 1.      I personally performed the services described in this documentation, which was scribed in my presence. The recorded information has been reviewed and considered. Linsi Humann Allison Quarry, DO 11/22/11 [phone]

## 2011-11-20 NOTE — ED Notes (Addendum)
Pt states SOB. Seen here last weekend for same per pt. Pt states cough has developed since last visit. Unable to cough anything up per pt. Pt on 2.5 L O2 at all times.

## 2011-11-20 NOTE — ED Notes (Addendum)
CRITICAL VALUE ALERT  Critical value received:  C02- 43  Date of notification:  11/20/11  Time of notification:  1649  Critical value read back:yes  Nurse who received alert:  Primitivo Gauze  MD notified (1st page):  1650  Time of first page:  1650  MD notified (2nd page):  Time of second page:  Responding MD:  Dr. Clarene Duke  Time MD responded:  [phone]

## 2011-11-20 NOTE — Progress Notes (Signed)
ANTIBIOTIC CONSULT NOTE  Pharmacy Consult for Vancomycin Indication: rule out pneumonia  No Known Allergies  Patient Measurements: Height: 5\' 7"  (170.2 cm) Weight: 209 lb (94.802 kg) IBW/kg (Calculated) : 61.6    Vital Signs: Temp: 98.9 F (37.2 C) (07/06 1516) Temp src: Oral (07/06 1516) BP: 125/54 mmHg (07/06 1901) Pulse Rate: 118  (07/06 1901) Intake/Output from previous day:   Intake/Output from this shift:    Labs:  Basename 11/20/11 1557  WBC 17.8*  HGB 13.4  PLT 207  LABCREA --  CREATININE 0.40*   Estimated Creatinine Clearance: 80.7 ml/min (by C-G formula based on Cr of 0.4). No results found for this basename: VANCOTROUGH:2,VANCOPEAK:2,VANCORANDOM:2,GENTTROUGH:2,GENTPEAK:2,GENTRANDOM:2,TOBRATROUGH:2,TOBRAPEAK:2,TOBRARND:2,AMIKACINPEAK:2,AMIKACINTROU:2,AMIKACIN:2, in the last 72 hours   Microbiology: Recent Results (from the past 720 hour(s))  MRSA PCR SCREENING     Status: Normal   Collection Time   11/12/11 11:13 PM      Component Value Range Status Comment   MRSA by PCR NEGATIVE  NEGATIVE Final     Medical History: Past Medical History  Diagnosis Date  . Muscular dystrophy   . COPD (chronic obstructive pulmonary disease)   . Asthma   . Hypoxemia   . CHF (congestive heart failure)   . Chronic pain   . Hypertension   . Glaucoma   . Muscle weakness   . Chronic respiratory failure with hypercapnia     And hypoxia    Medications:   (Not in a hospital admission)  Assessment: Okay for Protocol Also on Avelox  Goal of Therapy:  Vancomycin trough level 15-20 mcg/ml  Plan:  Measure antibiotic drug levels at steady state Follow up culture results Vancomycin 1000mg  IV every 12 hours.  Mady Gemma 11/20/2011,7:22 PM

## 2011-11-20 NOTE — H&P (Signed)
Karen Collier MRN: [id number] DOB/AGE: [birth year] 66 y.o. Primary Care Physician:HALL,ZACK, MD Admit date: 11/20/2011 Chief Complaint: Shortness of breath and congested cough. HPI: The patient is a 66 year old woman with a history significant for muscular dystrophy, COPD, chronic respiratory failure with hypoxia and hypercapnia, and reported history of congestive heart failure, who presents to the emergency department with a chief complaint of shortness of breath and congested cough. She was just discharged on 11/16/2011  treatment of COPD with acute bronchitis. She was treated appropriately with antibiotics, bronchodilators, and IV/oral steroids. Although she still had some chest congestion, she was symptomatically improved and discharged back to the assisted living facility on Ceftin and a prednisone taper. For the past 2-3 days, she has developed more chest congestion, wheezing, and shortness of breath. She has a wet cough but she is unable to expectorate the sputum. She denies fever, chills, chest pain, nausea, vomiting, abdominal pain, or pain with urination. She has had a few small loose stools.  In the emergency department, she is noted to be afebrile and hemodynamically stable. She is oxygenating between 93 and 97% on nasal cannula oxygen. Her ABG on 2.5 L of oxygen reveals a pH of 7.4, PCO2 of 66.5, and PO2 of 81.3. Her chest x-ray reveals bibasilar volume loss and elevated hemidiaphragms. Her lab data are significant for a serum sodium of 123, CO2 of 43, WBC of 17.8, normal troponin I., normal proBNP of 58.8, and normal lactic acid level. She is being admitted for further evaluation and management.  Past Medical History  Diagnosis Date  . Muscular dystrophy   . COPD (chronic obstructive pulmonary disease)   . Asthma   . Hypoxemia   . CHF (congestive heart failure)   . Chronic pain   . Hypertension   . Glaucoma   . Muscle weakness   . Chronic respiratory failure with hypercapnia     And  hypoxia    Past Surgical History  Procedure Date  . Unable   . Cholecystectomy   . Abdominal hysterectomy     Prior to Admission medications   Medication Sig Start Date End Date Taking? Authorizing Provider  albuterol (PROVENTIL) (2.5 MG/3ML) 0.083% nebulizer solution Take 2.5 mg by nebulization 3 (three) times daily.   Yes Historical Provider, MD  baclofen (LIORESAL) 10 MG tablet Take 10 mg by mouth 2 (two) times daily.   Yes Historical Provider, MD  Calcium Carbonate-Vitamin D (CALCIUM 600 + D PO) Take 1 tablet by mouth daily.   Yes Historical Provider, MD  carisoprodol (SOMA) 350 MG tablet Take 350 mg by mouth every 8 (eight) hours.    Yes Historical Provider, MD  carvedilol (COREG) 12.5 MG tablet Take 12.5 mg by mouth 2 (two) times daily.   Yes Historical Provider, MD  cefUROXime (CEFTIN) 500 MG tablet Take 1 tablet (500 mg total) by mouth 2 (two) times daily. 11/16/11 11/26/11 Yes Christiane Ha, MD  clotrimazole (LOTRIMIN) 1 % cream Apply 1 application topically 2 (two) times daily.   Yes Historical Provider, MD  docusate sodium (COLACE) 100 MG capsule Take 100 mg by mouth 2 (two) times daily.   Yes Historical Provider, MD  furosemide (LASIX) 20 MG tablet Take 20 mg by mouth daily.   Yes Historical Provider, MD  latanoprost (XALATAN) 0.005 % ophthalmic solution Place 1 drop into both eyes at bedtime.   Yes Historical Provider, MD  LORazepam (ATIVAN) 1 MG tablet Take 1 mg by mouth 3 (three) times daily.   Yes Historical  Provider, MD  meclizine (ANTIVERT) 25 MG tablet Take 25 mg by mouth daily.   Yes Historical Provider, MD  metolazone (ZAROXOLYN) 5 MG tablet Take 5 mg by mouth every other day.   Yes Historical Provider, MD  Multiple Vitamin (DAILY VITE) TABS Take 1 tablet by mouth daily.   Yes Historical Provider, MD  potassium chloride SA (K-DUR,KLOR-CON) 20 MEQ tablet Take 20 mEq by mouth 2 (two) times daily.   Yes Historical Provider, MD  predniSONE (STERAPRED UNI-PAK) 10 MG  tablet Take 1 tablet (10 mg total) by mouth daily. 4 tablets daily then decrease by 1 tablet every 2 days until off 11/16/11 11/26/11 Yes Christiane Ha, MD  Probiotic Product (ALIGN) 4 MG CAPS Take 1 capsule by mouth daily.   Yes Historical Provider, MD  simvastatin (ZOCOR) 20 MG tablet Take 20 mg by mouth at bedtime.   Yes Historical Provider, MD  traMADol (ULTRAM) 50 MG tablet Take 50-100 mg by mouth every 6 (six) hours as needed. pain    Historical Provider, MD    Allergies: No Known Allergies  Family history: Her mother died of lung cancer. Her father died of muscular dystrophy and congestive heart failure. She had one sister who died of lung cancer and another sister who died of breast cancer. She had yet another sister who died of a stroke.  Social History: She is single. She has no children. She lives at [facility] assisted living facility. She is retired from [employer] work. She denies tobacco, alcohol, and illicit drug use. She was exposed to secondhand smoke from her sisters. She ambulates a little with a walker but mostly just transfers. Her power of attorney is Ms. Claybon Jabs. The patient wants to be a full code.     ROS: As above in history present illness. She also sometimes has headaches and neck pain, otherwise review of systems is negative.  PHYSICAL EXAM: Blood pressure 150/76, pulse 109, temperature 98.9 F (37.2 C), temperature source Oral, resp. rate 20, height 5\' 7"  (1.702 m), weight 94.802 kg (209 lb), SpO2 97.00%. General: 66 year old Caucasian woman, sitting up in bed, in no acute distress. HEENT: Head is normocephalic, nontraumatic. Pupils are equal, round, and reactive to light. Extraocular movements are intact. Conjunctivae are clear. Sclerae are white. Oropharynx reveals mildly dry mucosa membranes. No posterior exudates or erythema. Neck: Supple, no adenopathy, no thyromegaly, no JVD. Lungs: Rhonchus wheezing, mostly in the upper lobes. Several wet coughs, but  the patient is unable to expectorate. Breathing is nonlabored at rest. Heart: Distant S1, S2 without significant murmurs, rubs, or gallops. Abdomen: Protuberant, mildly obese, soft, nontender, nondistended. Extremities: Trace of pedal edema. No acute hot red joints. Neurologic: She is alert and oriented x3. Her speech is clear. Cranial nerves II through XII are grossly intact. She is unable to raise her arms above her head. She is able to lift both legs against gravity approximately 10. Handgrip is 5 over 5 bilaterally. Sensation is grossly intact. Psychiatric: She has a flat affect. Her speech is clear. She is cooperative.  Basic Metabolic Panel:  Basename 11/20/11 1557  NA 123*  K 3.3*  CL 76*  CO2 43*  GLUCOSE 116*  BUN 13  CREATININE 0.40*  CALCIUM 9.6  MG --  PHOS --   Liver Function Tests: No results found for this basename: AST:2,ALT:2,ALKPHOS:2,BILITOT:2,PROT:2,ALBUMIN:2 in the last 72 hours No results found for this basename: LIPASE:2,AMYLASE:2 in the last 72 hours No results found for this basename: AMMONIA:2 in  the last 72 hours CBC:  Basename 11/20/11 1557  WBC 17.8*  NEUTROABS 16.0*  HGB 13.4  HCT 40.3  MCV 85.7  PLT 207   Cardiac Enzymes:  Basename 11/20/11 1557  CKTOTAL --  CKMB --  CKMBINDEX --  TROPONINI <0.30   BNP:  Basename 11/20/11 1557  PROBNP 58.8   D-Dimer: No results found for this basename: DDIMER:2 in the last 72 hours CBG: No results found for this basename: GLUCAP:6 in the last 72 hours Hemoglobin A1C: No results found for this basename: HGBA1C in the last 72 hours Fasting Lipid Panel: No results found for this basename: CHOL,HDL,LDLCALC,TRIG,CHOLHDL,LDLDIRECT in the last 72 hours Thyroid Function Tests: No results found for this basename: TSH,T4TOTAL,FREET4,T3FREE,THYROIDAB in the last 72 hours Anemia Panel: No results found for this basename: VITAMINB12,FOLATE,FERRITIN,TIBC,IRON,RETICCTPCT in the last 72  hours Coagulation: No results found for this basename: LABPROT:2,INR:2 in the last 72 hours Urine Drug Screen: Drugs of Abuse  No results found for this basename: labopia, cocainscrnur, labbenz, amphetmu, thcu, labbarb    Alcohol Level: No results found for this basename: ETH:2 in the last 72 hours Urinalysis: No results found for this basename: COLORURINE:2,APPERANCEUR:2,LABSPEC:2,PHURINE:2,GLUCOSEU:2,HGBUR:2,BILIRUBINUR:2,KETONESUR:2,PROTEINUR:2,UROBILINOGEN:2,NITRITE:2,LEUKOCYTESUR:2 in the last 72 hours Misc. Labs:   Recent Results (from the past 240 hour(s))  MRSA PCR SCREENING     Status: Normal   Collection Time   11/12/11 11:13 PM      Component Value Range Status Comment   MRSA by PCR NEGATIVE  NEGATIVE Final      Results for orders placed during the hospital encounter of 11/20/11 (from the past 48 hour(s))  PRO B NATRIURETIC PEPTIDE     Status: Normal   Collection Time   11/20/11  3:57 PM      Component Value Range Comment   Pro B Natriuretic peptide (BNP) 58.8  0 - 125 pg/mL   TROPONIN I     Status: Normal   Collection Time   11/20/11  3:57 PM      Component Value Range Comment   Troponin I <0.30  <0.30 ng/mL   CBC WITH DIFFERENTIAL     Status: Abnormal   Collection Time   11/20/11  3:57 PM      Component Value Range Comment   WBC 17.8 (*) 4.0 - 10.5 K/uL    RBC 4.70  3.87 - 5.11 MIL/uL    Hemoglobin 13.4  12.0 - 15.0 g/dL    HCT 84.1  66.0 - 63.0 %    MCV 85.7  78.0 - 100.0 fL    MCH 28.5  26.0 - 34.0 pg    MCHC 33.3  30.0 - 36.0 g/dL    RDW 16.0  10.9 - 32.3 %    Platelets 207  150 - 400 K/uL    Neutrophils Relative 90 (*) 43 - 77 %    Neutro Abs 16.0 (*) 1.7 - 7.7 K/uL    Lymphocytes Relative 6 (*) 12 - 46 %    Lymphs Abs 1.1  0.7 - 4.0 K/uL    Monocytes Relative 4  3 - 12 %    Monocytes Absolute 0.7  0.1 - 1.0 K/uL    Eosinophils Relative 0  0 - 5 %    Eosinophils Absolute 0.0  0.0 - 0.7 K/uL    Basophils Relative 0  0 - 1 %    Basophils Absolute 0.0   0.0 - 0.1 K/uL   BASIC METABOLIC PANEL     Status: Abnormal   Collection Time  11/20/11  3:57 PM      Component Value Range Comment   Sodium 123 (*) 135 - 145 mEq/L    Potassium 3.3 (*) 3.5 - 5.1 mEq/L    Chloride 76 (*) 96 - 112 mEq/L    CO2 43 (*) 19 - 32 mEq/L    Glucose, Bld 116 (*) 70 - 99 mg/dL    BUN 13  6 - 23 mg/dL    Creatinine, Ser 1.61 (*) 0.50 - 1.10 mg/dL    Calcium 9.6  8.4 - 09.6 mg/dL    GFR calc non Af Amer >90  >90 mL/min    GFR calc Af Amer >90  >90 mL/min   LACTIC ACID, PLASMA     Status: Normal   Collection Time   11/20/11  3:57 PM      Component Value Range Comment   Lactic Acid, Venous 1.0  0.5 - 2.2 mmol/L   PROCALCITONIN     Status: Normal   Collection Time   11/20/11  3:57 PM      Component Value Range Comment   Procalcitonin <0.05     BLOOD GAS, ARTERIAL     Status: Abnormal   Collection Time   11/20/11  6:05 PM      Component Value Range Comment   O2 Content 3.0      Delivery systems NASAL CANNULA      pH, Arterial 7.416  7.350 - 7.450    pCO2 arterial 66.5 (*) 35.0 - 45.0 mmHg    pO2, Arterial 81.3  80.0 - 100.0 mmHg    Bicarbonate 42.0 (*) 20.0 - 24.0 mEq/L    TCO2 37.0  0 - 100 mmol/L    Acid-Base Excess 16.4 (*) 0.0 - 2.0 mmol/L    O2 Saturation 95.7      Patient temperature 37.0      Collection site RIGHT RADIAL      Drawn by COLLECTED BY RT      Sample type ARTERIAL      Allens test (pass/fail) PASS  PASS     Dg Chest 2 View  11/20/2011  *RADIOLOGY REPORT*  Clinical Data: Cough.  Congestive heart failure.  CHEST - 2 VIEW  Comparison: 11/12/2011  Findings: The hemidiaphragms are elevated.  There is bibasilar atelectasis.  The upper lungs are clear.  No apparent effusions. No free air seen under the diaphragm.  IMPRESSION: Elevated hemidiaphragms.  Basilar volume loss.  Original Report Authenticated By: Thomasenia Sales, M.D.   Dg Chest Port 1 View  11/12/2011  *RADIOLOGY REPORT*  Clinical Data: Severe shortness of breath for past 2 days.   PORTABLE CHEST - 1 VIEW  Comparison: Chest x-ray 10/06/2010.  Findings: Lung volumes are very low.  Again noted is gaseous distension of the stomach and mild elevation of the left hemidiaphragm.  There are bibasilar opacities, favored to represent areas of bibasilar subsegmental atelectasis or scarring.  No definite consolidative airspace disease.  No definite pleural effusions.  Cardiac silhouette is nearly completely obscured. Mediastinal contours are unremarkable.  IMPRESSION: 1.  Low volume chest with probable bibasilar subsegmental atelectasis, and no definite radiographic evidence of acute cardiopulmonary disease.  Original Report Authenticated By: Florencia Reasons, M.D.    Impression:  Principal Problem:  *COPD (chronic obstructive pulmonary disease) with acute bronchitis Active Problems:  Hyponatremia  Muscular dystrophy  Hypokalemia  History of congestive heart failure  Chronic respiratory failure with hypercapnia   1. Recurrent COPD with acute bronchitis in the setting  of chronic hypoxic and hypercapnic respiratory failure.her ABG was just resulted. Her pH is normal and her PCO2 is elevated, indicating chronic compensatory changes. Her PCO2 was actually better than it was last week when she was admitted then. The patient is unable to expectorate and it is likely that most of her chest congestion and rhonchus wheezing is secondary to her inability to clear her secretions. This may be a neurological manifestation of her muscular dystrophy.  2. Hyponatremia. She reports no diarrhea and no vomiting. She is on Lasix and Zaroxolyn chronically for her history of congestive heart failure. She was noted to be hyponatremic during the previous hospitalization which actually corrected with gentle IV fluids. Her serum sodium is 123 and at the time of discharge on 11/16/2011, her serum sodium was 135.  3. Hypokalemia, likely secondary to Zaroxolyn and Lasix.  4. History of congestive heart failure.  I do not have a record of her ejection fraction. Her pro BNP is well within normal limits.  5. Muscular dystrophy. I wonder if her muscular dystrophy is worsening. Her inability to expectorate may be a manifestation of worsening muscular dystrophy. Also, she is treated chronically with lorazepam and baclofen which could also be contributing to her difficulty expectorating because of their muscle relaxation effects.  Plan:  1. We'll admit the patient to step down unit overnight for closer observation. 2. Discussed ordering a CT scan of the patient's chest with ED physician Dr. Clarene Duke. Will assess for acute lung abnormalities and PE. 3. The patient received 125 mg of Solu-Medrol in the emergency department. We'll continue more gentle steroids with IV Solu-Medrol at 60 mg every 12 hours. 4. Will discontinue Ceftin and start and Avelox and vancomycin empirically, given her recent hospitalization. 5. Will start oral Mucinex. We'll also give her a trial of Mucomyst nebulizer every 8 hours for 24 hours. We'll also order manual chest pulmonary toilet and a flutter valve. The patient may require suctioning, but this would be the last resort to try to help her clear secretions. 6. Decrease the dose of baclofen and Ativan. Discontinue tramadol. We'll also decrease the dose of carvedilol due to the ongoing bronchospasms. 7. IV fluid hydration. Hold Lasix and Zaroxolyn. Potassium chloride supplementation.      Dionisio Aragones 11/20/2011, 6:35 PM    The

## 2011-11-20 NOTE — ED Notes (Signed)
CRITICAL VALUE ALERT  Critical value received:  pco2 66.5  Date of notification:  11/20/11  Time of notification:  1815  Critical value read back:yes  Nurse who received alert:  Eustace Quail RN  MD notified (1st page):  Dr. Sherrie Mustache  Time of first page:  1815  MD notified (2nd page):  Time of second page:  Responding MD:    Time MD responded:

## 2011-11-21 ENCOUNTER — Encounter (HOSPITAL_COMMUNITY): Payer: Self-pay | Admitting: Internal Medicine

## 2011-11-21 DIAGNOSIS — J9819 Other pulmonary collapse: Secondary | ICD-10-CM

## 2011-11-21 DIAGNOSIS — J189 Pneumonia, unspecified organism: Secondary | ICD-10-CM

## 2011-11-21 DIAGNOSIS — E873 Alkalosis: Secondary | ICD-10-CM

## 2011-11-21 DIAGNOSIS — E041 Nontoxic single thyroid nodule: Secondary | ICD-10-CM

## 2011-11-21 DIAGNOSIS — R911 Solitary pulmonary nodule: Secondary | ICD-10-CM

## 2011-11-21 DIAGNOSIS — J9811 Atelectasis: Secondary | ICD-10-CM

## 2011-11-21 HISTORY — DX: Alkalosis: E87.3

## 2011-11-21 HISTORY — DX: Pneumonia, unspecified organism: J18.9

## 2011-11-21 HISTORY — DX: Solitary pulmonary nodule: R91.1

## 2011-11-21 HISTORY — DX: Atelectasis: J98.11

## 2011-11-21 HISTORY — DX: Nontoxic single thyroid nodule: E04.1

## 2011-11-21 LAB — CBC
HCT: 39 % (ref 36.0–46.0)
MCHC: 33.3 g/dL (ref 30.0–36.0)
MCV: 86.1 fL (ref 78.0–100.0)
RDW: 14.1 % (ref 11.5–15.5)

## 2011-11-21 LAB — COMPREHENSIVE METABOLIC PANEL
Alkaline Phosphatase: 59 U/L (ref 39–117)
BUN: 6 mg/dL (ref 6–23)
Chloride: 86 mEq/L — ABNORMAL LOW (ref 96–112)
GFR calc Af Amer: >90 mL/min (ref >90)
GFR calc non Af Amer: >90 mL/min (ref >90)
Glucose, Bld: 115 mg/dL — ABNORMAL HIGH (ref 70–99)
Potassium: 3.5 mEq/L (ref 3.5–5.1)
Total Bilirubin: 0.3 mg/dL (ref 0.3–1.2)

## 2011-11-21 LAB — GLUCOSE, CAPILLARY: Glucose-Capillary: 150 mg/dL — ABNORMAL HIGH (ref 70–99)

## 2011-11-21 MED ORDER — ACETYLCYSTEINE 20 % IN SOLN
2.0000 mL | Freq: Three times a day (TID) | RESPIRATORY_TRACT | Status: AC
  Administered 2011-11-21 (×2): 2 mL via RESPIRATORY_TRACT
  Filled 2011-11-21 (×3): qty 4

## 2011-11-21 MED ORDER — IPRATROPIUM BROMIDE 0.02 % IN SOLN
RESPIRATORY_TRACT | Status: AC
  Filled 2011-11-21: qty 2.5

## 2011-11-21 MED ORDER — ALBUTEROL SULFATE (5 MG/ML) 0.5% IN NEBU
INHALATION_SOLUTION | RESPIRATORY_TRACT | Status: AC
  Filled 2011-11-21: qty 0.5

## 2011-11-21 NOTE — Progress Notes (Signed)
Report called and given to Christine, RN. Pt alert, oriented and in stable condition at the time of transport. Pt being transported in chair to room 331. Pt being transported on oxygen and tele by NT.  

## 2011-11-21 NOTE — Progress Notes (Signed)
Pt tries but not as hard as she should this is most unfortunate. Her lack of deep breathing and lack of ability to put forth max effort at this time will be her down fall.

## 2011-11-21 NOTE — Progress Notes (Signed)
Subjective: The patient has less chest congestion. She is still unable to expectorate. No other complaints.  Objective: Vital signs in last 24 hours: Filed Vitals:   11/21/11 0700 11/21/11 0800 11/21/11 0818 11/21/11 0900  BP:  126/56    Pulse: 115 117    Temp:  98 F (36.7 C)    TempSrc:  Oral    Resp: 26 24  25   Height:      Weight:      SpO2: 98% 97% 98%     Intake/Output Summary (Last 24 hours) at 11/21/11 1000 Last data filed at 11/21/11 0900  Gross per 24 hour  Intake 1077.5 ml  Output    900 ml  Net  177.5 ml    Weight change:   Physical exam: General: Pleasant 66 year old Caucasian woman sitting up in bed, in no acute distress. Lungs: Significantly fewer rhonchus wheezing. Breathing is nonlabored. Heart: S1, S2, with a soft systolic murmur and occasional ectopic beat. Abdomen: Protuberant, slightly obese, positive bowel sounds, soft, nontender, nondistended. Extremities: Trace of pedal edema. Neurologic: She is alert and oriented x3. Cranial nerves II through XII are intact.  Lab Results: Basic Metabolic Panel:  Basename 11/21/11 0652 11/20/11 1557  NA 131* 123*  K 3.5 3.3*  CL 86* 76*  CO2 42* 43*  GLUCOSE 115* 116*  BUN 6 13  CREATININE 0.21* 0.40*  CALCIUM 9.4 9.6  MG -- --  PHOS -- --   Liver Function Tests:  Basename 11/21/11 0652  AST 14  ALT 16  ALKPHOS 59  BILITOT 0.3  PROT 6.4  ALBUMIN 3.1*   No results found for this basename: LIPASE:2,AMYLASE:2 in the last 72 hours No results found for this basename: AMMONIA:2 in the last 72 hours CBC:  Basename 11/21/11 0437 11/20/11 1557  WBC 11.6* 17.8*  NEUTROABS -- 16.0*  HGB 13.0 13.4  HCT 39.0 40.3  MCV 86.1 85.7  PLT 185 207   Cardiac Enzymes:  Basename 11/20/11 1557  CKTOTAL --  CKMB --  CKMBINDEX --  TROPONINI <0.30   BNP:  Basename 11/20/11 1557  PROBNP 58.8   D-Dimer: No results found for this basename: DDIMER:2 in the last 72 hours CBG:  Basename 11/21/11 0727  11/20/11 2137  GLUCAP 150* 240*   Hemoglobin A1C: No results found for this basename: HGBA1C in the last 72 hours Fasting Lipid Panel: No results found for this basename: CHOL,HDL,LDLCALC,TRIG,CHOLHDL,LDLDIRECT in the last 72 hours Thyroid Function Tests: No results found for this basename: TSH,T4TOTAL,FREET4,T3FREE,THYROIDAB in the last 72 hours Anemia Panel: No results found for this basename: VITAMINB12,FOLATE,FERRITIN,TIBC,IRON,RETICCTPCT in the last 72 hours Coagulation: No results found for this basename: LABPROT:2,INR:2 in the last 72 hours Urine Drug Screen: Drugs of Abuse  No results found for this basename: labopia,  cocainscrnur,  labbenz,  amphetmu,  thcu,  labbarb    Alcohol Level: No results found for this basename: ETH:2 in the last 72 hours Urinalysis:  Basename 11/20/11 2223  COLORURINE YELLOW  LABSPEC 1.010  PHURINE 7.0  GLUCOSEU 250*  HGBUR NEGATIVE  BILIRUBINUR NEGATIVE  KETONESUR TRACE*  PROTEINUR NEGATIVE  UROBILINOGEN 0.2  NITRITE NEGATIVE  LEUKOCYTESUR NEGATIVE   Misc. Labs:   Micro: Recent Results (from the past 240 hour(s))  MRSA PCR SCREENING     Status: Normal   Collection Time   11/12/11 11:13 PM      Component Value Range Status Comment   MRSA by PCR NEGATIVE  NEGATIVE Final   MRSA PCR SCREENING  Status: Normal   Collection Time   11/20/11  9:10 PM      Component Value Range Status Comment   MRSA by PCR NEGATIVE  NEGATIVE Final     Studies/Results: Dg Chest 2 View  11/20/2011  *RADIOLOGY REPORT*  Clinical Data: Cough.  Congestive heart failure.  CHEST - 2 VIEW  Comparison: 11/12/2011  Findings: The hemidiaphragms are elevated.  There is bibasilar atelectasis.  The upper lungs are clear.  No apparent effusions. No free air seen under the diaphragm.  IMPRESSION: Elevated hemidiaphragms.  Basilar volume loss.  Original Report Authenticated By: Thomasenia Sales, M.D.   Ct Angio Chest W/cm &/or Wo Cm  11/20/2011  *RADIOLOGY REPORT*   Clinical Data: History of COPD, asthma, CHF, and hypertension, presenting with cough and shortness of breath.  CT ANGIOGRAPHY CHEST  Technique:  Multidetector CT imaging of the chest using the standard protocol during bolus administration of intravenous contrast. Multiplanar reconstructed images including MIPs were obtained and reviewed to evaluate the vascular anatomy.  Contrast: 150 ml total Omnipaque 350 IV (65 ml initially, with repeat imaging necessary with 85 ml)  Comparison: None.  Findings: Contrast opacification of pulmonary arteries is good. Examination limited by the markedly suboptimal inspiration and low lung volumes. No filling defects within either main pulmonary artery or their branches in either lung to suggest pulmonary embolism.  Heart size normal.  No pericardial effusion.  Mild aortic annular calcification.  Mild LAD coronary artery atherosclerosis.  Mild atherosclerosis involving the thoracic upper abdominal aorta without aneurysm or dissection.  Dense atelectasis in the lower lobes related to the suboptimal inspiration.  Patchy ground-glass airspace opacities in the upper lobes bilaterally.  No evidence of interstitial lung disease.  No pleural effusions.  2 mm nodule in the superior segment right lower lobe (series 5, image 36).  No visible nodules elsewhere.  Mild diffuse thyroid gland enlargement, with an approximate 1.5 cm nodule in the left lobe.  No significant mediastinal, hilar, or axillary lymphadenopathy.  Visualized upper abdomen unremarkable. Bone window images demonstrate mild diffuse thoracic spondylosis.  IMPRESSION:  1.  No evidence pulmonary embolism. 2.  Markedly suboptimal inspiration with dense atelectasis in the lower lobes bilaterally. 3.  Patchy ground-glass airspace opacities in both lungs which are likely inflammatory. 4.  Solitary 2 mm nodule the superior segment right lower lobe. If the patient is at high risk for bronchogenic carcinoma, follow-up chest CT at 1 year  is recommended.  If the patient is at low risk, no follow-up is needed.  This recommendation follows the consensus statement: Guidelines for Management of Small Pulmonary Nodules Detected on CT Scans: A Statement from the Fleischner Society as published in Radiology 2005; 237:395-400. 5.  Diffuse thyroid gland enlargement with an approximate 1.5 cm left lobe thyroid nodule.  Consider further evaluation with a non- emergent thyroid ultrasound.  If patient is clinically hyperthyroid, consider nuclear medicine thyroid uptake and scan.  Original Report Authenticated By: Arnell Sieving, M.D.    Medications:  Scheduled:   . acetylcysteine  2 mL Nebulization Q8H  . acidophilus  1 capsule Oral Daily  . albuterol  10 mg Nebulization Once  . albuterol  2.5 mg Nebulization Q4H  . baclofen  5 mg Oral BID  . carvedilol  6.25 mg Oral BID WC  . docusate sodium  100 mg Oral BID  . enoxaparin (LOVENOX) injection  40 mg Subcutaneous Q24H  . famotidine  20 mg Oral Daily  . guaiFENesin  600 mg  Oral BID  . insulin aspart  0-5 Units Subcutaneous QHS  . insulin aspart  0-9 Units Subcutaneous TID WC  . ipratropium  0.5 mg Nebulization Q4H  . ipratropium  1 mg Nebulization Once  . latanoprost  1 drop Both Eyes QHS  . LORazepam  0.5 mg Oral TID  . methylPREDNISolone (SOLU-MEDROL) injection  125 mg Intravenous Once  . methylPREDNISolone (SOLU-MEDROL) injection  60 mg Intravenous Q12H  . moxifloxacin  400 mg Intravenous Q24H  . potassium chloride  30 mEq Oral BID  . simvastatin  20 mg Oral QHS  . vancomycin  1,000 mg Intravenous STAT  . vancomycin  1,000 mg Intravenous Q12H  . DISCONTD: acetylcysteine  2 mL Nebulization Q8H  . DISCONTD: baclofen  10 mg Oral BID   Continuous:   . 0.9 % NaCl with KCl 20 mEq / L 75 mL/hr at 11/20/11 2306  . DISCONTD: sodium chloride 75 mL/hr at 11/20/11 1603   AVW:[id number], acetaminophen, alum & mag hydroxide-simeth, iohexol, iohexol, ondansetron (ZOFRAN) IV,  ondansetron, oxyCODONE  Assessment: Principal Problem:  *COPD (chronic obstructive pulmonary disease) with acute bronchitis Active Problems:  Hyponatremia  Muscular dystrophy  Hypokalemia  History of congestive heart failure  Chronic respiratory failure with hypercapnia  Hospital acquired PNA  Atelectasis  Lung nodule  Thyroid nodule  Compensated alkalosis    1. COPD with acute bronchitis, likely inflammatory hospital associated pneumonia, and bibasilar atelectasis. CT of the chest results reviewed. No evidence of pulmonary embolism. Her bronchospasms and rhonchi are significantly less. We'll continue Avelox, vancomycin, Solu-Medrol, albuterol/Atrovent nebulizers, Mucinex, and Mucomyst. Apparently Mucomyst was unavailable last night but hopefully she will be receiving it today. We'll also continue chest pulmonary toilet, Flutter valve, and incentive spirometry. We'll need to order speech therapy evaluation to evaluate for silent aspiration.  Chronic hypoxic and hypercapnic respiratory failure. Based on her ABG, her respiratory failure is compensated. She has compensatory metabolic alkalosis. We'll continue chronic oxygen therapy at 2.5 L per minute.  History of congestive heart failure. She is on carvedilol, Zaroxolyn, and Lasix chronically. There are no signs of decompensated heart failure on exam or clinically. The dose of carvedilol was decreased because of bronchospasms. Zaroxolyn and Lasix are being held because of hyponatremia.  Hyponatremia. This is recurrent. I believe her recurrent hyponatremia secondary to a combination of Lasix and Zaroxolyn. Her serum sodium has improved on holding the diuretics and giving her gentle normal saline.  Hypokalemia. Improved on supplementation.  Lung nodule and thyroid nodule per CT of the chest. These can be followed every 6 months to 12 months in the outpatient setting. Her thyroid function is within normal limits based on her TSH during the  previous hospitalization.  Chronic muscular dystrophy. The dose of lorazepam and baclofen were decreased to see if that decrease with improve her muscular strength to expectorate.  Plan:  1. Continue normal saline infusion, decrease the rate a little. Continue to hold Zaroxolyn and Lasix. Consider restarting one of them in the morning. Continue smaller dose of carvedilol. Will increase gradually as her blood pressure increases and/or bronchospasms resolved. 2. Continue potassium chloride supplementation. 3. Speech therapy consultation in the morning. 4. Physical therapy consultation in the morning. 5. The patient was encouraged to sit up out of bed as much as possible to decrease atelectasis.   LOS: 1 day   Loral Campi 11/21/2011, 10:00 AM

## 2011-11-22 DIAGNOSIS — J9620 Acute and chronic respiratory failure, unspecified whether with hypoxia or hypercapnia: Secondary | ICD-10-CM

## 2011-11-22 LAB — BASIC METABOLIC PANEL
BUN: 9 mg/dL (ref 6–23)
Calcium: 9.1 mg/dL (ref 8.4–10.5)
Creatinine, Ser: 0.32 mg/dL — ABNORMAL LOW (ref 0.50–1.10)
GFR calc non Af Amer: >90 mL/min (ref >90)
Glucose, Bld: 153 mg/dL — ABNORMAL HIGH (ref 70–99)
Sodium: 133 mEq/L — ABNORMAL LOW (ref 135–145)

## 2011-11-22 LAB — GLUCOSE, CAPILLARY
Glucose-Capillary: 102 mg/dL — ABNORMAL HIGH (ref 70–99)
Glucose-Capillary: 150 mg/dL — ABNORMAL HIGH (ref 70–99)
Glucose-Capillary: 152 mg/dL — ABNORMAL HIGH (ref 70–99)
Glucose-Capillary: 157 mg/dL — ABNORMAL HIGH (ref 70–99)

## 2011-11-22 LAB — VANCOMYCIN, TROUGH: Vancomycin Tr: 7.4 ug/mL — ABNORMAL LOW (ref 10.0–20.0)

## 2011-11-22 MED ORDER — MOXIFLOXACIN HCL 400 MG PO TABS
400.0000 mg | ORAL_TABLET | Freq: Every day | ORAL | Status: DC
  Administered 2011-11-22: 400 mg via ORAL
  Filled 2011-11-22: qty 1

## 2011-11-22 MED ORDER — PREDNISONE 20 MG PO TABS
60.0000 mg | ORAL_TABLET | Freq: Two times a day (BID) | ORAL | Status: DC
  Administered 2011-11-22 – 2011-11-23 (×2): 60 mg via ORAL
  Filled 2011-11-22 (×2): qty 3

## 2011-11-22 MED ORDER — VANCOMYCIN HCL IN DEXTROSE 1-5 GM/200ML-% IV SOLN
1000.0000 mg | Freq: Three times a day (TID) | INTRAVENOUS | Status: DC
  Filled 2011-11-22 (×4): qty 200

## 2011-11-22 MED ORDER — LORAZEPAM 1 MG PO TABS
1.0000 mg | ORAL_TABLET | Freq: Two times a day (BID) | ORAL | Status: DC
  Administered 2011-11-22 – 2011-11-23 (×3): 1 mg via ORAL
  Filled 2011-11-22 (×3): qty 1

## 2011-11-22 MED ORDER — BACLOFEN 10 MG PO TABS
10.0000 mg | ORAL_TABLET | Freq: Two times a day (BID) | ORAL | Status: DC
  Administered 2011-11-22 – 2011-11-23 (×2): 10 mg via ORAL
  Filled 2011-11-22 (×2): qty 1

## 2011-11-22 MED ORDER — FUROSEMIDE 20 MG PO TABS
20.0000 mg | ORAL_TABLET | Freq: Every day | ORAL | Status: DC
  Administered 2011-11-22 – 2011-11-23 (×2): 20 mg via ORAL
  Filled 2011-11-22 (×2): qty 1

## 2011-11-22 NOTE — Progress Notes (Signed)
Pt placed on telemetry monitor at 0558.

## 2011-11-22 NOTE — Evaluation (Signed)
Clinical/Bedside Swallow Evaluation Patient Details  Name: Karen Collier MRN: [id number] Date of Birth: 08-01-1945  Today's Date: 11/22/2011 Time: 1243-1310 SLP Time Calculation (min): 27 min  Past Medical History:  Past Medical History  Diagnosis Date  . Muscular dystrophy   . COPD (chronic obstructive pulmonary disease)   . Asthma   . Hypoxemia   . CHF (congestive heart failure)   . Chronic pain   . Hypertension   . Glaucoma   . Muscle weakness   . Chronic respiratory failure with hypercapnia     And hypoxia  . Atelectasis 11/21/2011  . Lung nodule 11/21/2011  . Thyroid nodule 11/21/2011  . Compensated alkalosis 11/21/2011   Past Surgical History:  Past Surgical History  Procedure Date  . Unable   . Cholecystectomy   . Abdominal hysterectomy    HPI:  Karen Collier is a 66 yo resident at [facility] who was admitted with COPD exacerbation.    Assessment / Plan / Recommendation Clinical Impression  No overt signs and symptoms of aspiration during bedside exam. Pt is limited in her head/neck movement (natural chin tuck) when sitting upright which may pose some difficulty with po at times, however pt denies. If silent aspiration is suspected, MBSS can be completed but seems unlikely at this time.    Aspiration Risk  Mild    Diet Recommendation Regular;Thin liquid (Will need assist to cut up meats/salads)   Liquid Administration via: Cup;Straw Medication Administration: Whole meds with liquid Supervision: Patient able to self feed Compensations: Slow rate;Small sips/bites Postural Changes and/or Swallow Maneuvers: Seated upright 90 degrees;Upright 30-60 min after meal    Other  Recommendations Oral Care Recommendations: Oral care BID Other Recommendations: Clarify dietary restrictions   Follow Up Recommendations  Skilled Nursing facility    Frequency and Duration min 1 x/week  1 week       Swallow Study Prior Functional Status  Type of Home:  (long term care) Lives  With:  (long term care facility) Available Help at Discharge: Available 24 hours/day    General Date of Onset: 11/21/11 HPI: Karen Collier is a 66 yo resident at [facility] who was admitted with COPD exacerbation.  Type of Study: Bedside swallow evaluation Previous Swallow Assessment: None on record Diet Prior to this Study: Regular (carb modified) Temperature Spikes Noted: No Respiratory Status: Supplemental O2 delivered via (comment) History of Recent Intubation: No Behavior/Cognition: Alert;Cooperative;Pleasant mood Oral Cavity - Dentition: Adequate natural dentition Self-Feeding Abilities: Able to feed self (currently has tremors which interfere) Patient Positioning: Upright in bed Baseline Vocal Quality: Clear Volitional Cough: Weak Volitional Swallow: Able to elicit    Oral/Motor/Sensory Function Overall Oral Motor/Sensory Function: Appears within functional limits for tasks assessed   Ice Chips Ice chips: Within functional limits Presentation: Spoon   Thin Liquid Thin Liquid: Within functional limits Presentation: Self Fed;Straw    Nectar Thick Nectar Thick Liquid: Not tested   Honey Thick Honey Thick Liquid: Not tested   Puree Puree: Within functional limits Presentation: Self Fed;Spoon   Solid Solid: Within functional limits Presentation: Self Fed Other Comments: She prefers meats and salads chopped or shredded.    Thank you,  Havery Moros, CCC-SLP [phone]  PORTER,DABNEY 11/22/2011,3:18 PM

## 2011-11-22 NOTE — Evaluation (Signed)
Physical Therapy Evaluation Patient Details Name: Karen Collier MRN: [id number] DOB: 03-02-1946 Today's Date: 11/22/2011 Time: 4098-1191 PT Time Calculation (min): 29 min  PT Assessment / Plan / Recommendation Clinical Impression  [name] is currently in a rest home and appears to be close to baseline in functioning status.  Due to HX of MD pt states she very rarely walks more than a few steps she normally transfers into her wheelchair.  PT will be seen 3/wk while in hospital stay to improve safety in transfering.    PT Assessment  Patient needs continued PT services    Follow Up Recommendations  No PT follow up    Barriers to Discharge None      Equipment Recommendations  None recommended by PT    Recommendations for Other Services  none   Frequency Min 3X/week    Precautions / Restrictions Precautions Precautions: Fall Restrictions Weight Bearing Restrictions: No       Mobility  Bed Mobility Bed Mobility: Supine to Sit Supine to Sit: 4: Min assist Sitting - Scoot to Edge of Bed: 6: Modified independent (Device/Increase time) Sit to Supine: 5: Supervision Transfers Transfers: Sit to Stand;Stand to Sit;Stand Pivot Transfers Sit to Stand: 5: Supervision Stand to Sit: 6: Modified independent (Device/Increase time) Stand Pivot Transfers: 4: Min guard Ambulation/Gait Ambulation/Gait Assistance: 4: Min guard Ambulation Distance (Feet): 3 Feet Assistive device: Rolling walker Gait Pattern: Trunk flexed;Step-to pattern Gait velocity: slow Stairs: No Wheelchair Mobility Wheelchair Mobility: No    Exercises General Exercises - Lower Extremity Ankle Circles/Pumps: AROM;10 reps;Both Quad Sets: AROM;Both;10 reps Gluteal Sets: AROM;Both;10 reps Long Arc Quad: AROM;Both;10 reps Heel Slides: AROM;Both;10 reps Hip ABduction/ADduction: AROM;Both;10 reps   PT Diagnosis: Difficulty walking;Generalized weakness  PT Problem List: Decreased activity tolerance;Decreased  mobility;Decreased strength;Obesity PT Treatment Interventions: Gait training;Therapeutic exercise   PT Goals Acute Rehab PT Goals PT Goal Formulation: With patient Potential to Achieve Goals: Good Pt will go Sit to Stand: with modified independence PT Goal: Sit to Stand - Progress: Goal set today Pt will go Stand to Sit: Independently PT Goal: Stand to Sit - Progress: Goal set today Pt will Transfer Bed to Chair/Chair to Bed: with supervision PT Transfer Goal: Bed to Chair/Chair to Bed - Progress: Goal set today Pt will Stand: with modified independence;1 - 2 min PT Goal: Stand - Progress: Goal set today Pt will Ambulate: 1 - 15 feet;with supervision;with rolling walker PT Goal: Ambulate - Progress: Goal set today  Visit Information  Last PT Received On: 11/22/11    Subjective Data  Subjective: Pt states that she never really walks she is assisted into her wheelchair by the aides.     Prior Functioning  Home Living Lives With:  (long term care facility) Available Help at Discharge: Available 24 hours/day Type of Home:  (long term care) Home Access: Level entry Home Layout: One level Bathroom Shower/Tub: Health visitor: Handicapped height Bathroom Accessibility: Yes Prior Function Level of Independence: Needs assistance Needs Assistance: Bathing;Dressing;Feeding;Grooming;Toileting;Gait;Transfers Able to Take Stairs?: No Driving: No Communication Communication: No difficulties    Cognition  Overall Cognitive Status: Appears within functional limits for tasks assessed/performed Arousal/Alertness: Awake/alert Orientation Level: Appears intact for tasks assessed Behavior During Session: [facility] for tasks performed    Extremity/Trunk Assessment Right Lower Extremity Assessment RLE ROM/Strength/Tone: Deficits RLE ROM/Strength/Tone Deficits: baseline Left Lower Extremity Assessment LLE ROM/Strength/Tone: Deficits LLE ROM/Strength/Tone Deficits: baseline     Balance Balance Balance Assessed: No  End of Session PT - End of  Session Equipment Utilized During Treatment: Gait belt Activity Tolerance: Patient tolerated treatment well Patient left: in bed;with call bell/phone within reach;with bed alarm set Nurse Communication: Mobility status  GP     RUSSELL,CINDY 11/22/2011, 3:58 PM

## 2011-11-22 NOTE — Progress Notes (Signed)
ANTIBIOTIC CONSULT NOTE  Pharmacy Consult for Vancomycin Indication: rule out pneumonia  No Known Allergies  Patient Measurements: Height: 5\' 7"  (170.2 cm) Weight: 205 lb 7.5 oz (93.2 kg) IBW/kg (Calculated) : 61.6    Vital Signs: Temp: 97.8 F (36.6 C) (07/08 0513) Temp src: Oral (07/08 0513) BP: 127/75 mmHg (07/08 0513) Pulse Rate: 93  (07/08 0513) Intake/Output from previous day: 07/07 0701 - 07/08 0700 In: 3106.3 [P.O.:1440; I.V.:1216.3; IV Piggyback:450] Out: 1625 [Urine:1625] Intake/Output from this shift:    Labs:  Basename 11/22/11 1103 11/21/11 0652 11/21/11 0437 11/20/11 1557  WBC -- -- 11.6* 17.8*  HGB -- -- 13.0 13.4  PLT -- -- 185 207  LABCREA -- -- -- --  CREATININE 0.32* 0.21* -- 0.40*   Estimated Creatinine Clearance: 79.9 ml/min (by C-G formula based on Cr of 0.32).  Basename 11/22/11 1103  VANCOTROUGH 7.4*  VANCOPEAK --  VANCORANDOM --  GENTTROUGH --  GENTPEAK --  GENTRANDOM --  TOBRATROUGH --  TOBRAPEAK --  TOBRARND --  AMIKACINPEAK --  AMIKACINTROU --  AMIKACIN --     Microbiology: Recent Results (from the past 720 hour(s))  MRSA PCR SCREENING     Status: Normal   Collection Time   11/12/11 11:13 PM      Component Value Range Status Comment   MRSA by PCR NEGATIVE  NEGATIVE Final   MRSA PCR SCREENING     Status: Normal   Collection Time   11/20/11  9:10 PM      Component Value Range Status Comment   MRSA by PCR NEGATIVE  NEGATIVE Final     Medical History: Past Medical History  Diagnosis Date  . Muscular dystrophy   . COPD (chronic obstructive pulmonary disease)   . Asthma   . Hypoxemia   . CHF (congestive heart failure)   . Chronic pain   . Hypertension   . Glaucoma   . Muscle weakness   . Chronic respiratory failure with hypercapnia     And hypoxia  . Atelectasis 11/21/2011  . Lung nodule 11/21/2011  . Thyroid nodule 11/21/2011  . Compensated alkalosis 11/21/2011   Assessment: Okay for Protocol Also on Avelox Trough  below goal. Estimated Creatinine Clearance: 79.9 ml/min (by C-G formula based on Cr of 0.32).  Goal of Therapy:  Vancomycin trough level 15-20 mcg/ml  Plan:  Measure antibiotic drug levels at steady state Follow up culture results Increase Vancomycin 1000 mg IV every 8 hours. Repeat trough in 2 days if continues.  Mady Gemma 11/22/2011,12:51 PM

## 2011-11-22 NOTE — Clinical Documentation Improvement (Signed)
CHF DOCUMENTATION CLARIFICATION QUERY  THIS DOCUMENT IS NOT A PERMANENT PART OF THE MEDICAL RECORD  TO RESPOND TO THE THIS QUERY, FOLLOW THE INSTRUCTIONS BELOW:  1. If needed, update documentation for the patient's encounter via the notes activity.  2. Access this query again and click edit on the In [employer].  3. After updating, or not, click F2 to complete all highlighted (required) fields concerning your review. Select "additional documentation in the medical record" OR "no additional documentation provided".  4. Click Sign note button.  5. The deficiency will fall out of your In Basket *Please let us know if you are not able to complete this workflow by phone or e-mail (listed below).  Please update your documentation within the medical record to reflect your response to this query.                                                                                    11/22/11  Dear Dr. Sherrie Mustache, D/ Associates,  In a better effort to capture your patient's severity of illness, reflect appropriate length of stay and utilization of resources, a review of the patient medical record has revealed the following indicators the diagnosis of Heart Failure.    Based on your clinical judgment, please clarify and document in a progress note and/or discharge summary the clinical condition associated with the following supporting information:  In responding to this query please exercise your independent judgment.  The fact that a query is asked, does not imply that any particular answer is desired or expected.  Pt admitted with HCAP, COPD, and acute bronchitis  According to H/P pt is on Lasix and Zaroxolyn chronically for her history of congestive heart failure.  Currently pt taking Coreg.   Please clarify the acuity and type of CHF for this admission and document in pn  Or d/c summary.    Thank you for all that you do for our patients!   Possible Clinical Conditions?   Chronic  Systolic Congestive Heart Failure Chronic Diastolic Congestive Heart Failure Chronic Systolic & Diastolic Congestive Heart Failure Acute Systolic Congestive Heart Failure Acute Diastolic Congestive Heart Failure Acute Systolic & Diastolic Congestive Heart Failure Acute on Chronic Systolic Congestive Heart Failure Acute on Chronic Diastolic Congestive Heart Failure Acute on Chronic Systolic & Diastolic  Congestive Heart Failure Other Condition________________________________________ Cannot Clinically Determine  Supporting Information:  Risk Factors: CHF HCAP, COPD, Acute bronchitis, hyponatremia. Hypokalemia, Muscular dystrophy, Chronic resp failure, asthma   Signs & Symptoms:  Per H/P She is on Lasix and Zaroxolyn chronically for her history of congestive heart failure History of congestive heart failure. I do not have a record of her ejection fraction. Her pro BNP is well within normal limits.    Diagnostics:   Treatment:   carvedilol (COREG) tablet 6.25 mg     albuterol (PROVENTIL) (5 MG/ML) 0.5% nebulizer   acetylcysteine (MUCOMYST) 20 % nebulizer  Reviewed:  no additional documentation provided ljh. Could not locate this patient in [employer] on 11/29/11  Thank You,  Enis Slipper  RN, BSN, CCDS Clinical Documentation Specialist [facility] Dept Pager: [phone] / E-mail: [email or web address]  Health Information Management

## 2011-11-22 NOTE — Progress Notes (Signed)
Subjective: The patient has less chest congestion. She is breathing better. She is still having difficulty expectorating. She complains of feeling tired and feeling more shaky.  Objective: Vital signs in last 24 hours: Filed Vitals:   11/22/11 0411 11/22/11 0513 11/22/11 0759 11/22/11 1123  BP:  127/75    Pulse:  93    Temp:  97.8 F (36.6 C)    TempSrc:  Oral    Resp:  20    Height:      Weight:      SpO2: 98% 99% 92% 92%    Intake/Output Summary (Last 24 hours) at 11/22/11 1354 Last data filed at 11/22/11 0429  Gross per 24 hour  Intake 1456.25 ml  Output    675 ml  Net 781.25 ml    Weight change:   Physical exam: General: Pleasant 66 year old Caucasian woman sitting up in bed, in no acute distress. Lungs: No audible wheezing anteriorly, rare audible wheezing in the bases, significantly improved. Breathing is nonlabored. Heart: S1, S2, with a soft systolic murmur and occasional ectopic beat. Abdomen: Protuberant, slightly obese, positive bowel sounds, soft, nontender, nondistended. Extremities: Trace of pedal edema. Neurologic: She is alert and oriented x3. Cranial nerves II through XII are intact.  Lab Results: Basic Metabolic Panel:  Basename 11/22/11 1103 11/21/11 0652  NA 133* 131*  K 3.7 3.5  CL 91* 86*  CO2 36* 42*  GLUCOSE 153* 115*  BUN 9 6  CREATININE 0.32* 0.21*  CALCIUM 9.1 9.4  MG -- --  PHOS -- --   Liver Function Tests:  Basename 11/21/11 0652  AST 14  ALT 16  ALKPHOS 59  BILITOT 0.3  PROT 6.4  ALBUMIN 3.1*   No results found for this basename: LIPASE:2,AMYLASE:2 in the last 72 hours No results found for this basename: AMMONIA:2 in the last 72 hours CBC:  Basename 11/21/11 0437 11/20/11 1557  WBC 11.6* 17.8*  NEUTROABS -- 16.0*  HGB 13.0 13.4  HCT 39.0 40.3  MCV 86.1 85.7  PLT 185 207   Cardiac Enzymes:  Basename 11/20/11 1557  CKTOTAL --  CKMB --  CKMBINDEX --  TROPONINI <0.30   BNP:  Basename 11/20/11 1557  PROBNP  58.8   D-Dimer: No results found for this basename: DDIMER:2 in the last 72 hours CBG:  Basename 11/22/11 1125 11/22/11 0734 11/21/11 2149 11/21/11 1851 11/21/11 1122 11/21/11 0727  GLUCAP 150* 102* 152* 147* 119* 150*   Hemoglobin A1C: No results found for this basename: HGBA1C in the last 72 hours Fasting Lipid Panel: No results found for this basename: CHOL,HDL,LDLCALC,TRIG,CHOLHDL,LDLDIRECT in the last 72 hours Thyroid Function Tests: No results found for this basename: TSH,T4TOTAL,FREET4,T3FREE,THYROIDAB in the last 72 hours Anemia Panel: No results found for this basename: VITAMINB12,FOLATE,FERRITIN,TIBC,IRON,RETICCTPCT in the last 72 hours Coagulation: No results found for this basename: LABPROT:2,INR:2 in the last 72 hours Urine Drug Screen: Drugs of Abuse  No results found for this basename: labopia,  cocainscrnur,  labbenz,  amphetmu,  thcu,  labbarb    Alcohol Level: No results found for this basename: ETH:2 in the last 72 hours Urinalysis:  Basename 11/20/11 2223  COLORURINE YELLOW  LABSPEC 1.010  PHURINE 7.0  GLUCOSEU 250*  HGBUR NEGATIVE  BILIRUBINUR NEGATIVE  KETONESUR TRACE*  PROTEINUR NEGATIVE  UROBILINOGEN 0.2  NITRITE NEGATIVE  LEUKOCYTESUR NEGATIVE   Misc. Labs:   Micro: Recent Results (from the past 240 hour(s))  MRSA PCR SCREENING     Status: Normal   Collection Time  11/12/11 11:13 PM      Component Value Range Status Comment   MRSA by PCR NEGATIVE  NEGATIVE Final   MRSA PCR SCREENING     Status: Normal   Collection Time   11/20/11  9:10 PM      Component Value Range Status Comment   MRSA by PCR NEGATIVE  NEGATIVE Final     Studies/Results: Dg Chest 2 View  11/20/2011  *RADIOLOGY REPORT*  Clinical Data: Cough.  Congestive heart failure.  CHEST - 2 VIEW  Comparison: 11/12/2011  Findings: The hemidiaphragms are elevated.  There is bibasilar atelectasis.  The upper lungs are clear.  No apparent effusions. No free air seen under the  diaphragm.  IMPRESSION: Elevated hemidiaphragms.  Basilar volume loss.  Original Report Authenticated By: Thomasenia Sales, M.D.   Ct Angio Chest W/cm &/or Wo Cm  11/20/2011  *RADIOLOGY REPORT*  Clinical Data: History of COPD, asthma, CHF, and hypertension, presenting with cough and shortness of breath.  CT ANGIOGRAPHY CHEST  Technique:  Multidetector CT imaging of the chest using the standard protocol during bolus administration of intravenous contrast. Multiplanar reconstructed images including MIPs were obtained and reviewed to evaluate the vascular anatomy.  Contrast: 150 ml total Omnipaque 350 IV (65 ml initially, with repeat imaging necessary with 85 ml)  Comparison: None.  Findings: Contrast opacification of pulmonary arteries is good. Examination limited by the markedly suboptimal inspiration and low lung volumes. No filling defects within either main pulmonary artery or their branches in either lung to suggest pulmonary embolism.  Heart size normal.  No pericardial effusion.  Mild aortic annular calcification.  Mild LAD coronary artery atherosclerosis.  Mild atherosclerosis involving the thoracic upper abdominal aorta without aneurysm or dissection.  Dense atelectasis in the lower lobes related to the suboptimal inspiration.  Patchy ground-glass airspace opacities in the upper lobes bilaterally.  No evidence of interstitial lung disease.  No pleural effusions.  2 mm nodule in the superior segment right lower lobe (series 5, image 36).  No visible nodules elsewhere.  Mild diffuse thyroid gland enlargement, with an approximate 1.5 cm nodule in the left lobe.  No significant mediastinal, hilar, or axillary lymphadenopathy.  Visualized upper abdomen unremarkable. Bone window images demonstrate mild diffuse thoracic spondylosis.  IMPRESSION:  1.  No evidence pulmonary embolism. 2.  Markedly suboptimal inspiration with dense atelectasis in the lower lobes bilaterally. 3.  Patchy ground-glass airspace opacities in  both lungs which are likely inflammatory. 4.  Solitary 2 mm nodule the superior segment right lower lobe. If the patient is at high risk for bronchogenic carcinoma, follow-up chest CT at 1 year is recommended.  If the patient is at low risk, no follow-up is needed.  This recommendation follows the consensus statement: Guidelines for Management of Small Pulmonary Nodules Detected on CT Scans: A Statement from the Fleischner Society as published in Radiology 2005; 237:395-400. 5.  Diffuse thyroid gland enlargement with an approximate 1.5 cm left lobe thyroid nodule.  Consider further evaluation with a non- emergent thyroid ultrasound.  If patient is clinically hyperthyroid, consider nuclear medicine thyroid uptake and scan.  Original Report Authenticated By: Arnell Sieving, M.D.    Medications:  Scheduled:    . acetylcysteine  2 mL Nebulization Q8H  . acidophilus  1 capsule Oral Daily  . albuterol  2.5 mg Nebulization Q4H  . albuterol      . baclofen  5 mg Oral BID  . carvedilol  6.25 mg Oral BID WC  .  docusate sodium  100 mg Oral BID  . enoxaparin (LOVENOX) injection  40 mg Subcutaneous Q24H  . famotidine  20 mg Oral Daily  . guaiFENesin  600 mg Oral BID  . insulin aspart  0-5 Units Subcutaneous QHS  . insulin aspart  0-9 Units Subcutaneous TID WC  . ipratropium      . ipratropium  0.5 mg Nebulization Q4H  . latanoprost  1 drop Both Eyes QHS  . LORazepam  0.5 mg Oral TID  . methylPREDNISolone (SOLU-MEDROL) injection  60 mg Intravenous Q12H  . moxifloxacin  400 mg Intravenous Q24H  . simvastatin  20 mg Oral QHS  . vancomycin  1,000 mg Intravenous Q8H  . DISCONTD: vancomycin  1,000 mg Intravenous Q12H   Continuous:    . 0.9 % NaCl with KCl 20 mEq / L 65 mL/hr at 11/21/11 1851   BMW:[id number], acetaminophen, alum & mag hydroxide-simeth, ondansetron (ZOFRAN) IV, ondansetron, oxyCODONE  Assessment: Principal Problem:  *COPD (chronic obstructive pulmonary disease) with acute  bronchitis Active Problems:  Hyponatremia  Muscular dystrophy  Hypokalemia  History of congestive heart failure  Chronic respiratory failure with hypercapnia  Hospital acquired PNA  Atelectasis  Lung nodule  Thyroid nodule  Compensated alkalosis    1. COPD with acute bronchitis, likely inflammatory hospital associated pneumonia, and bibasilar atelectasis. CT of the chest results reviewed. No evidence of pulmonary embolism. Her bronchospasms and rhonchi are significantly less. She is improving on Avelox, vancomycin, Solu-Medrol, albuterol/Atrovent nebulizers, Mucinex, and Mucomyst, and incentive spirometry. Speech therapy consult is pending.  Chronic hypoxic and hypercapnic respiratory failure. Based on her ABG, her respiratory failure is compensated. She has compensatory metabolic alkalosis. We'll continue chronic oxygen therapy at 2.5 L per minute.  History of congestive heart failure. She is on carvedilol, Zaroxolyn, and Lasix chronically. There are no signs of decompensated heart failure on exam or clinically. The dose of carvedilol was decreased because of bronchospasms. Zaroxolyn and Lasix are being held because of hyponatremia, but will restart Lasix today.  Hyponatremia. This is recurrent. I believe her recurrent hyponatremia secondary to a combination of Lasix and Zaroxolyn. Her serum sodium has improved on holding the diuretics and giving her gentle normal saline.  Hypokalemia. Improved on supplementation.  Lung nodule and thyroid nodule per CT of the chest. These can be followed every 6 months to 12 months in the outpatient setting. Her thyroid function is within normal limits based on her TSH during the previous hospitalization.  Chronic muscular dystrophy. The dose of lorazepam and baclofen were decreased to see if that decrease with improve her muscular strength to expectorate. She says that she is feeling more shaky, and this may be secondary to the decrease in dose of  lorazepam and baclofen.  Plan:  1. The patient has lost IV access, and therefore, we'll change Avelox to by mouth and discontinue Solu-Medrol in favor of prednisone. 2. Will increase the dose of baclofen back to 10 mg twice a day. We'll change lorazepam to 1 mg twice a day instead of 3 times a day which she had been on chronically. 3. ST consult pending. 4. Restart lasix and decreased IV fluids a little. Continue to hold Zaroxolyn.    LOS: 2 days   Aaronjames Kelsay 11/22/2011, 1:54 PM

## 2011-11-22 NOTE — Clinical Social Work Psychosocial (Signed)
Clinical Social Work Department BRIEF PSYCHOSOCIAL ASSESSMENT 11/22/2011  Patient:  Karen Collier, Karen Collier     Account Number:  [id number]     Admit date:  11/20/2011  Clinical Social Worker:  Nancie Neas  Date/Time:  11/22/2011 12:00 N  Referred by:  Physician  Date Referred:  11/22/2011 Referred for  ALF Placement   Other Referral:   Interview type:  Patient Other interview type:   and POA- Karen Collier    PSYCHOSOCIAL DATA Living Status:  FACILITY Admitted from facility:  [facility] Level of care:   Primary support name:  Karen Collier Primary support relationship to patient:  FRIEND Degree of support available:   very supportive per pt    CURRENT CONCERNS Current Concerns  Post-Acute Placement   Other Concerns:    SOCIAL WORK ASSESSMENT / PLAN CSW met with pt and pt's POA Karen Collier at bedside.  Pt alert and oriented and reports she has been a resident at [facility] for over a year.  She is pleased with care at [facility] and requests to return at d/c. Karen Collier appears to be very involved and supportive and is with pt often in room. Per Karen Collier at [facility], pt is able to transfer and walk several feet with assistance.  Pt states she was to begin receiving home health through [facility] but was then readmitted.  Okay for pt to return at d/c per Karen Collier.   Assessment/plan status:  Psychosocial Support/Ongoing Assessment of Needs Other assessment/ plan:   Information/referral to community resources:   [facility]  CM for home health    PATIENT'S/FAMILY'S RESPONSE TO PLAN OF CARE: Pt and POA report positive feelings regarding return to [facility] when medically stable.  CSW to continue to follow.        Derenda Fennel, [place] [phone]

## 2011-11-23 ENCOUNTER — Encounter (HOSPITAL_COMMUNITY): Payer: Self-pay | Admitting: Internal Medicine

## 2011-11-23 LAB — URINE CULTURE
Colony Count: NO GROWTH
Culture: NO GROWTH

## 2011-11-23 MED ORDER — ALBUTEROL SULFATE (2.5 MG/3ML) 0.083% IN NEBU: 2.5000 mg | INHALATION_SOLUTION | Freq: Three times a day (TID) | RESPIRATORY_TRACT | Status: DC

## 2011-11-23 MED ORDER — GUAIFENESIN ER 600 MG PO TB12: 600.0000 mg | ORAL_TABLET | Freq: Two times a day (BID) | ORAL | Status: AC

## 2011-11-23 MED ORDER — MECLIZINE HCL 25 MG PO TABS: 25.0000 mg | ORAL_TABLET | Freq: Every day | ORAL | Status: DC | PRN

## 2011-11-23 MED ORDER — CARVEDILOL 6.25 MG PO TABS: 6.2500 mg | ORAL_TABLET | Freq: Two times a day (BID) | ORAL | Status: AC

## 2011-11-23 MED ORDER — FAMOTIDINE 20 MG PO TABS: 20.0000 mg | ORAL_TABLET | Freq: Every day | ORAL | Status: DC

## 2011-11-23 MED ORDER — PREDNISONE 10 MG PO TABS: ORAL_TABLET | ORAL | Status: DC

## 2011-11-23 MED ORDER — LORAZEPAM 1 MG PO TABS: 1.0000 mg | ORAL_TABLET | Freq: Two times a day (BID) | ORAL | Status: DC

## 2011-11-23 MED ORDER — MOXIFLOXACIN HCL 400 MG PO TABS: 400.0000 mg | ORAL_TABLET | Freq: Every day | ORAL | Status: AC

## 2011-11-23 NOTE — Progress Notes (Signed)
Pt d/ced back to ALF at this time. Taken out of facility by staff via w/c. Transported by POA.

## 2011-11-23 NOTE — Clinical Social Work Note (Signed)
Pt d/c today by MD back to [facility]. Pt and facility aware and agreeable.  POA to transport.  Pt requesting living will document which CSW provided.  She will complete and have notarized.  Angelica Chessman, RN reviewed FL2 for accuracy.  Derenda Fennel, [place] [phone]

## 2011-11-23 NOTE — Discharge Summary (Signed)
Physician Discharge Summary  Zanai Mallari MRN: [id number] DOB/AGE: [birth year] 66 y.o.  PCP: Dwana Melena, MD   Admit date: 11/20/2011 Discharge date: 11/23/2011  Discharge Diagnoses:  1. Hospital associated pneumonia. 2. 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 3. Chronic hypoxic and hypercapnic respiratory failure, with compensatory alkalosis. ABG on admission was at baseline: PH 7.4, PCO2 67, and PO2 81. 4. Difficulty expectorating, suspect the cause of muscular dystrophy. 5. Chronic bibasilar atelectasis. 6. History of chronic congestive heart failure, suspect chronic diastolic dysfunction. 7. Hypokalemia. 8. Recurrent hyponatremia, secondary to hypovolemia. Zaroxolyn was discontinued. 9. His umbilical finding of small thyroid nodule and lung nodule. Followup surveillance CT of the chest recommended in 6-12 months. 10. Steroid-induced hyperglycemia. 11. Hypertension.   Medication List  As of 11/23/2011 10:55 AM   STOP taking these medications         carisoprodol 350 MG tablet      cefUROXime 500 MG tablet      metolazone 5 MG tablet         TAKE these medications         albuterol (2.5 MG/3ML) 0.083% nebulizer solution   Commonly known as: PROVENTIL   Take 3 mLs (2.5 mg total) by nebulization 3 (three) times daily. YOU CAN ALSO GIVE YOURSELF ADDITIONAL NEBULIZER TREATMENTS AS NEEDED FOR INCREASED WHEEZING AND CHEST CONGESTION.      ALIGN 4 MG Caps   Take 1 capsule by mouth daily.      baclofen 10 MG tablet   Commonly known as: LIORESAL   Take 10 mg by mouth 2 (two) times daily.      CALCIUM 600 + D PO   Take 1 tablet by mouth daily.      carvedilol 6.25 MG tablet   Commonly known as: COREG   Take 1 tablet (6.25 mg total) by mouth 2 (two) times daily.      clotrimazole 1 % cream   Commonly known as: LOTRIMIN   Apply 1 application topically 2 (two) times daily.      Daily Vite Tabs   Take 1 tablet by mouth daily.      docusate  sodium 100 MG capsule   Commonly known as: COLACE   Take 100 mg by mouth 2 (two) times daily.      famotidine 20 MG tablet   Commonly known as: PEPCID   Take 1 tablet (20 mg total) by mouth daily.      furosemide 20 MG tablet   Commonly known as: LASIX   Take 20 mg by mouth daily.      guaiFENesin 600 MG 12 hr tablet   Commonly known as: MUCINEX   Take 1 tablet (600 mg total) by mouth 2 (two) times daily.      latanoprost 0.005 % ophthalmic solution   Commonly known as: XALATAN   Place 1 drop into both eyes at bedtime.      LORazepam 1 MG tablet   Commonly known as: ATIVAN   Take 1 tablet (1 mg total) by mouth 2 (two) times daily.      meclizine 25 MG tablet   Commonly known as: ANTIVERT   Take 1 tablet (25 mg total) by mouth daily as needed for dizziness.      moxifloxacin 400 MG tablet   Commonly known as: AVELOX   Take 1 tablet (400 mg total) by mouth daily at 6 PM. Antibiotic to take for 7 more days.      potassium chloride  SA 20 MEQ tablet   Commonly known as: K-DUR,KLOR-CON   Take 20 mEq by mouth 2 (two) times daily.      predniSONE 10 MG tablet   Commonly known as: DELTASONE   TAKE 6 TABLETS DAILY FOR 2 DAYS; THEN 5 TABLETS DAILY FOR 2 DAYS; THEN 4 TABLETS FOR 1 DAY; THEN 3 TABLETS THE NEXT DAY; THEN 2 TABLETS NEXT DAY; THEN 1 TABLET NEXT DAY; THEN STOP.      simvastatin 20 MG tablet   Commonly known as: ZOCOR   Take 20 mg by mouth at bedtime.      traMADol 50 MG tablet   Commonly known as: ULTRAM   Take 50-100 mg by mouth every 68 (six) hours as needed. pain            Discharge Condition: Improved.  Disposition: [facility]Another [facility] Care Institution Not Defined   Consults: None.  Significant Diagnostic Studies: Dg Chest 2 View  11/20/2011  *RADIOLOGY REPORT*  Clinical Data: Cough.  Congestive heart failure.  CHEST - 2 VIEW  Comparison: 11/12/2011  Findings: The hemidiaphragms are elevated.  There is bibasilar atelectasis.  The upper lungs are clear.   No apparent effusions. No free air seen under the diaphragm.  IMPRESSION: Elevated hemidiaphragms.  Basilar volume loss.  Original Report Authenticated By: Thomasenia Sales, M.D.   Ct Angio Chest W/cm &/or Wo Cm  11/20/2011  *RADIOLOGY REPORT*  Clinical Data: History of COPD, asthma, CHF, and hypertension, presenting with cough and shortness of breath.  CT ANGIOGRAPHY CHEST  Technique:  Multidetector CT imaging of the chest using the standard protocol during bolus administration of intravenous contrast. Multiplanar reconstructed images including MIPs were obtained and reviewed to evaluate the vascular anatomy.  Contrast: 150 ml total Omnipaque 350 IV (65 ml initially, with repeat imaging necessary with 85 ml)  Comparison: None.  Findings: Contrast opacification of pulmonary arteries is good. Examination limited by the markedly suboptimal inspiration and low lung volumes. No filling defects within either main pulmonary artery or their branches in either lung to suggest pulmonary embolism.  Heart size normal.  No pericardial effusion.  Mild aortic annular calcification.  Mild LAD coronary artery atherosclerosis.  Mild atherosclerosis involving the thoracic upper abdominal aorta without aneurysm or dissection.  Dense atelectasis in the lower lobes related to the suboptimal inspiration.  Patchy ground-glass airspace opacities in the upper lobes bilaterally.  No evidence of interstitial lung disease.  No pleural effusions.  2 mm nodule in the superior segment right lower lobe (series 5, image 36).  No visible nodules elsewhere.  Mild diffuse thyroid gland enlargement, with an approximate 1.5 cm nodule in the left lobe.  No significant mediastinal, hilar, or axillary lymphadenopathy.  Visualized upper abdomen unremarkable. Bone window images demonstrate mild diffuse thoracic spondylosis.  IMPRESSION:  1.  No evidence pulmonary embolism. 2.  Markedly suboptimal inspiration with dense atelectasis in the lower lobes  bilaterally. 3.  Patchy ground-glass airspace opacities in both lungs which are likely inflammatory. 4.  Solitary 2 mm nodule the superior segment right lower lobe. If the patient is at high risk for bronchogenic carcinoma, follow-up chest CT at 1 year is recommended.  If the patient is at low risk, no follow-up is needed.  This recommendation follows the consensus statement: Guidelines for Management of Small Pulmonary Nodules Detected on CT Scans: A Statement from the Fleischner Society as published in Radiology 2005; 237:395-400. 5.  Diffuse thyroid gland enlargement with an approximate 1.5 cm left lobe  thyroid nodule.  Consider further evaluation with a non- emergent thyroid ultrasound.  If patient is clinically hyperthyroid, consider nuclear medicine thyroid uptake and scan.  Original Report Authenticated By: Arnell Sieving, M.D.   Dg Chest Port 1 View  11/12/2011  *RADIOLOGY REPORT*  Clinical Data: Severe shortness of breath for past 2 days.  PORTABLE CHEST - 1 VIEW  Comparison: Chest x-ray 10/06/2010.  Findings: Lung volumes are very low.  Again noted is gaseous distension of the stomach and mild elevation of the left hemidiaphragm.  There are bibasilar opacities, favored to represent areas of bibasilar subsegmental atelectasis or scarring.  No definite consolidative airspace disease.  No definite pleural effusions.  Cardiac silhouette is nearly completely obscured. Mediastinal contours are unremarkable.  IMPRESSION: 1.  Low volume chest with probable bibasilar subsegmental atelectasis, and no definite radiographic evidence of acute cardiopulmonary disease.  Original Report Authenticated By: Florencia Reasons, M.D.     Microbiology: Recent Results (from the past 240 hour(s))  MRSA PCR SCREENING     Status: Normal   Collection Time   11/20/11  9:10 PM      Component Value Range Status Comment   MRSA by PCR NEGATIVE  NEGATIVE Final   URINE CULTURE     Status: Normal   Collection Time   11/20/11  10:24 PM      Component Value Range Status Comment   Specimen Description URINE, CLEAN CATCH   Final    Special Requests NONE   Final    Culture  Setup Time 11/21/2011 19:36   Final    Colony Count NO GROWTH   Final    Culture NO GROWTH   Final    Report Status 11/23/2011 FINAL   Final      Labs: Results for orders placed during the hospital encounter of 11/20/11 (from the past 48 hour(s))  GLUCOSE, CAPILLARY     Status: Abnormal   Collection Time   11/21/11 11:22 AM      Component Value Range Comment   Glucose-Capillary 119 (*) 70 - 99 mg/dL    Comment 1 Notify RN      Comment 2 Documented in Chart     GLUCOSE, CAPILLARY     Status: Abnormal   Collection Time   11/21/11  6:51 PM      Component Value Range Comment   Glucose-Capillary 147 (*) 70 - 99 mg/dL   GLUCOSE, CAPILLARY     Status: Abnormal   Collection Time   11/21/11  9:49 PM      Component Value Range Comment   Glucose-Capillary 152 (*) 70 - 99 mg/dL    Comment 1 Documented in Chart      Comment 2 Notify RN     GLUCOSE, CAPILLARY     Status: Abnormal   Collection Time   11/22/11  7:34 AM      Component Value Range Comment   Glucose-Capillary 102 (*) 70 - 99 mg/dL   BASIC METABOLIC PANEL     Status: Abnormal   Collection Time   11/22/11 11:03 AM      Component Value Range Comment   Sodium 133 (*) 135 - 145 mEq/L    Potassium 3.7  3.5 - 5.1 mEq/L    Chloride 91 (*) 96 - 112 mEq/L    CO2 36 (*) 19 - 32 mEq/L    Glucose, Bld 153 (*) 70 - 99 mg/dL    BUN 9  6 - 23 mg/dL  Creatinine, Ser 0.32 (*) 0.50 - 1.10 mg/dL DELTA CHECK NOTED   Calcium 9.1  8.4 - 10.5 mg/dL    GFR calc non Af Amer >90  >90 mL/min    GFR calc Af Amer >90  >90 mL/min   VANCOMYCIN, TROUGH     Status: Abnormal   Collection Time   11/22/11 11:03 AM      Component Value Range Comment   Vancomycin Tr 7.4 (*) 10.0 - 20.0 ug/mL   GLUCOSE, CAPILLARY     Status: Abnormal   Collection Time   11/22/11 11:25 AM      Component Value Range Comment    Glucose-Capillary 150 (*) 70 - 99 mg/dL   GLUCOSE, CAPILLARY     Status: Abnormal   Collection Time   11/22/11  4:38 PM      Component Value Range Comment   Glucose-Capillary 152 (*) 70 - 99 mg/dL   GLUCOSE, CAPILLARY     Status: Abnormal   Collection Time   11/22/11 10:11 PM      Component Value Range Comment   Glucose-Capillary 157 (*) 70 - 99 mg/dL    Comment 1 Notify RN     GLUCOSE, CAPILLARY     Status: Normal   Collection Time   11/23/11  7:35 AM      Component Value Range Comment   Glucose-Capillary 88  70 - 99 mg/dL    Comment 1 Notify RN        HPI : The patient is a 66 year old woman with a history significant for oxygen-dependent COPD, muscular dystrophy, and a reported chronic congestive heart failure (suspect diastolic dysfunction), who presented to the emergency department on 11/20/2011 for shortness of breath and chest congestion. She had been recently discharged on 11/16/2011 for treatment of COPD with acute bronchitis. In the emergency department, she was noted to be afebrile and hemodynamically stable. She was oxygenating between 93 and 97% on nasal cannula oxygen. Her ABG on 2 L of oxygen revealed a pH of 7.4, PCO2 of 66.5, and PO2 of 81.3. Her chest x-ray revealed bibasilar volume loss and elevated hemidiaphragms. Her lab data were significant for a serum sodium of 123, PCO2 of 43, WBC of 17.8, normal troponin I., normal pro BNP of 58.8, and normal lactic acid level. She was admitted for further evaluation and management.   HOSPITAL COURSE: This is the patient's second hospitalization in 1 week. Therefore, CT angiogram of the chest was ordered to rule out PE and/or pneumonia or a lung mass not detected by chest x-ray. The CT scan revealed no evidence of pulmonary embolism, but patchy ground-glass air space opacities were seen. This was interpreted to be pneumonia by the dictating physician. There was also significant bibasilar atelectasis. The CT also revealed incidental findings of  a solitary 2 mm nodule in the right upper lobe of the lung and small 1.5 cm left lobe thyroid nodule. The patient's thyroid function was within normal limits during the previous hospitalization. Both of these findings can be monitored with a followup CT scan in 6-12 months. The patient was started on Avelox and vancomycin for treatment. Additionally, IV Solu-Medrol, albuterol/Atrovent nebulizers, and Mucinex were ordered. Because of her difficulty of expectorating her sputum, Mucomyst nebulizers were given x3 doses. Manual pulmonary toilet was ordered. Incentive spirometry was also ordered and encouraged as the patient had significant atelectasis. There was a suspicion that she could be aspirating. The speech therapist was consulted and per her bedside evaluation, there was no  gross evidence of dysphagia. Her clinical suspicion for aspiration was low, and therefore, a modified barium swallow study was not ordered. Also, the dose of carvedilol was decreased due to the bronchospasms. Pepcid was started empirically because of the recent and ongoing steroid treatment for acute bronchitis. Because of her recurrent hyponatremia, both Lasix and Zaroxolyn were withheld for 48 hours. Gentle IV fluids with normal saline was started. She was repleted with potassium chloride for treatment of hypokalemia.    Because of her difficulty expectorating, it was believed that baclofen and lorazepam were contributing to muscle fatigue. Therefore, the dose of baclofen was decreased and the frequency of lorazepam was decreased. Consequently, the patient complained of worsening shaking. Therefore, the dose of the baclofen was restarted at 10 mg twice a day, but the lorazepam remained at 1 mg twice a day rather than 3 times a day.   With treatment, the patient's rhonchus wheezing completely resolved. Her serum sodium improved to 133 and her serum potassium improved 3.7 prior to discharge. She was discharged to home in improved  condition on ongoing antibiotic therapy with Avelox, albuterol nebulizer, prednisone taper, Mucinex, and chronic oxygen. The dose of carvedilol was decreased from 12.5 mg twice a day to 6.25 mg twice a day because of the recurrent bronchospasms. Her blood pressure remained controlled. There was no evidence of decompensated congestive heart failure. Lasix was eventually restarted but Zaroxolyn remained discontinued indefinitely because of recurrent hyponatremia.   Discharge Exam:  Blood pressure 141/73, pulse 100, temperature 98.3 F (36.8 C), temperature source Oral, resp. rate 20, height 5\' 7"  (1.702 m), weight 93.2 kg (205 lb 7.5 oz), SpO2 99.00%.   Lungs: Rare anteriorly with decreased breath sounds in the bases. Heart: S1, S2, with a soft systolic murmur. Abdomen: Mildly obese, positive bowel sounds, soft, nontender, nondistended. Extremities: Trace of pedal edema bilaterally.    Discharge Orders    Future Orders Please Complete By Expires   Diet - low sodium heart healthy      Increase activity slowly      Discharge instructions      Comments:   THE DOSE OF CARVEDILOL WAS DECREASED BECAUSE OF YOUR WHEEZING.  LORAZEPAM WAS DECREASED TO TWICE DAILY INSTEAD OF 3 TIMES DAILY. METOLAZONE WAS DISCONTINUED BECAUSE OF YOUR LOW  SODIUM BLOOD LEVEL. YOU'LL NEED TO FOLLOWUP WITH DR. HALL AS SCHEDULED AND HAVE YOUR SODIUM AND POTASSIUM BLOOD LEVELS RECHECKED.      Follow-up Information    Follow up with [facility], [place]. (Followup as previously scheduled. You'll need to have your blood potassium and sodium levels rechecked.)    Contact information:   [address] [place] [address] [phone]          Total discharge time: Greater than 35 minutes.   Signed: Nessa Ramaker 11/23/2011, 10:55 AM

## 2011-11-23 NOTE — Progress Notes (Signed)
UR Chart Review Completed  

## 2011-11-23 NOTE — Clinical Social Work Note (Signed)
Pt completed living will and CSW notarized document.  Original and copy sent with pt and copy placed on chart.  Derenda Fennel, [place] [phone]

## 2011-12-08 ENCOUNTER — Other Ambulatory Visit (HOSPITAL_COMMUNITY): Payer: Self-pay | Admitting: Internal Medicine

## 2011-12-08 DIAGNOSIS — E041 Nontoxic single thyroid nodule: Secondary | ICD-10-CM

## 2011-12-10 ENCOUNTER — Other Ambulatory Visit (HOSPITAL_COMMUNITY): Payer: Medicare Other

## 2011-12-14 ENCOUNTER — Ambulatory Visit (HOSPITAL_COMMUNITY)
Admission: RE | Admit: 2011-12-14 | Discharge: 2011-12-14 | Disposition: A | Discharge status: Home / Self Care | Payer: Medicare Other | Source: Ambulatory Visit | Attending: Internal Medicine | Admitting: Internal Medicine

## 2011-12-14 DIAGNOSIS — E041 Nontoxic single thyroid nodule: Secondary | ICD-10-CM

## 2011-12-14 DIAGNOSIS — E049 Nontoxic goiter, unspecified: Secondary | ICD-10-CM | POA: Insufficient documentation

## 2011-12-21 ENCOUNTER — Other Ambulatory Visit (HOSPITAL_COMMUNITY): Payer: Self-pay | Admitting: Internal Medicine

## 2011-12-21 DIAGNOSIS — R221 Localized swelling, mass and lump, neck: Secondary | ICD-10-CM

## 2011-12-22 ENCOUNTER — Other Ambulatory Visit (HOSPITAL_COMMUNITY): Payer: Self-pay | Admitting: Internal Medicine

## 2011-12-22 ENCOUNTER — Inpatient Hospital Stay (HOSPITAL_COMMUNITY): Admission: RE | Admit: 2011-12-22 | Payer: Medicare Other | Source: Ambulatory Visit

## 2011-12-22 ENCOUNTER — Ambulatory Visit (HOSPITAL_COMMUNITY)
Admission: RE | Admit: 2011-12-22 | Discharge: 2011-12-22 | Disposition: A | Discharge status: Home / Self Care | Payer: Medicare Other | Source: Ambulatory Visit | Attending: Internal Medicine | Admitting: Internal Medicine

## 2011-12-22 DIAGNOSIS — R221 Localized swelling, mass and lump, neck: Secondary | ICD-10-CM

## 2011-12-22 DIAGNOSIS — E049 Nontoxic goiter, unspecified: Secondary | ICD-10-CM | POA: Insufficient documentation

## 2011-12-22 NOTE — Progress Notes (Signed)
Lidocaine 2%         2mL injected                 Left thyroid biopsy performed 

## 2012-11-27 ENCOUNTER — Telehealth: Payer: Self-pay | Admitting: Gastroenterology

## 2012-11-27 ENCOUNTER — Ambulatory Visit (INDEPENDENT_AMBULATORY_CARE_PROVIDER_SITE_OTHER): Payer: Medicare Other | Admitting: Gastroenterology

## 2012-11-27 ENCOUNTER — Encounter: Payer: Self-pay | Admitting: Gastroenterology

## 2012-11-27 VITALS — BP 129/70 | HR 79 | Temp 97.6°F | Ht 66.0 in | Wt 219.0 lb

## 2012-11-27 DIAGNOSIS — K219 Gastro-esophageal reflux disease without esophagitis: Secondary | ICD-10-CM | POA: Insufficient documentation

## 2012-11-27 DIAGNOSIS — R195 Other fecal abnormalities: Secondary | ICD-10-CM

## 2012-11-27 DIAGNOSIS — K5900 Constipation, unspecified: Secondary | ICD-10-CM | POA: Insufficient documentation

## 2012-11-27 MED ORDER — LINACLOTIDE 145 MCG PO CAPS: 145.0000 ug | ORAL_CAPSULE | Freq: Every day | ORAL | Status: DC

## 2012-11-27 MED ORDER — OMEPRAZOLE 20 MG PO CPDR: 20.0000 mg | DELAYED_RELEASE_CAPSULE | Freq: Every day | ORAL | Status: AC

## 2012-11-27 NOTE — Telephone Encounter (Signed)
After discussion with Dr. Jena Gauss, patient may complete her prep the day of the procedure. She will need to be done around 2pm, and she should arrive at short stay at 7 but not later than 7:30. She will be prepped starting at that time. Needs clear liquids 2 days prior to actual procedure.   I let Claybon Jabs, her POA know this plan. She is available the following June dates: 24, 29, 30, 31. In August she is available the 12th.   Please set up for TCS+/- EGD with RMR as above. 2 days of clear liquids prior to procedure. Prep for colonoscopy the day of the procedure starting in endo as above.

## 2012-11-27 NOTE — Patient Instructions (Addendum)
Take Linzess (for constipation) and Prilosec (for reflux) 30 minutes prior to breakfast. If you have loose stool, hold the Linzess.   We will be in touch regarding the best way to prep for the colonoscopy.

## 2012-11-27 NOTE — Progress Notes (Signed)
Discussed with Dr. Jena Gauss: Prep day of procedure in short stay, starting at 7am. Procedure afternoon around 1 or 2. 2 days of clear liquids prior.

## 2012-11-27 NOTE — Assessment & Plan Note (Signed)
Linzess 145 mcg daily 

## 2012-11-27 NOTE — Assessment & Plan Note (Signed)
Stop Pepcid. Start Prilosec.

## 2012-11-27 NOTE — Assessment & Plan Note (Signed)
67 year old female found to have heme positive stool in the setting of intermittent constipation; she denies any overt signs of GI bleeding such as hematochezia or melena. Hgb is just mildly low at 11.8, otherwise normal CBC. Last colonoscopy in remote past, greater than 10 years ago, normal per patient. No prior EGD. She has chronic predominant constipation, alternating with occasional loose stool. I feel the recent fecal incontinence is secondary to her inability to reach the bathroom in sufficient time on her own. Only upper GI symptom is reflux, less than ideally controlled with Pepcid.   Will trial Linzess 145 mcg daily, stop pepcid, add Prilosec daily.  Proceed with TCS+/- EGD with Dr. Jena Gauss in the near future. I discussed the risks and benefits in detail with the patient and her friend, Claybon Jabs [phone]).  Patient would like to prep for the colonoscopy in the hospital due to her mobility limitations. Will discuss this with Dr. Jena Gauss.

## 2012-11-27 NOTE — Progress Notes (Signed)
Primary Care Physician:  HALL, ZACH, MD Primary Gastroenterologist:  Dr. Rourk   Chief Complaint  Patient presents with  . Anemia    HPI:   Karen Collier is a 66-year-old female presenting today at the request of Dr. Hall secondary to anemia and heme positive stool. Outside CBC includes mild normocytic anemia with Hgb 11.8. No hematochezia or melena. Notes chronic LLQ soreness, intermittent, worsened with seeds/corn/popcorn. Relieved with defecation.   Denies weight loss, poor appetite. No N/V. Notes occasional dizziness. +reflux. Notes new onset of belching. Pepcid daily with continued symptoms. No dysphagia. Occasional fecal incontinence. "messed up clothes" twice in the past few days. Has to wait for someone to assist her. Has intermittent hard stools alternating with loose stools. On stool softener now. Trends towards more constipation. States she has to strain a lot.   Used to live with her 3 sisters, now all deceased. Lives now in [place] for past 2.5 years.    Last colonoscopy greater than 10 years ago in [place]. Denies history of polyps. No prior EGD.   Past Medical History  Diagnosis Date  . Muscular dystrophy   . COPD (chronic obstructive pulmonary disease)   . Asthma   . Hypoxemia   . CHF (congestive heart failure)   . Chronic pain   . Hypertension   . Glaucoma   . Muscle weakness   . Chronic respiratory failure with hypercapnia     And hypoxia  . Atelectasis 11/21/2011  . Lung nodule 11/21/2011  . Thyroid nodule 11/21/2011  . Compensated alkalosis 11/21/2011  . Pneumonia 11/21/2011  . Hyponatremia Recurrent    Normalizes with normal saline  . GERD (gastroesophageal reflux disease)     Past Surgical History  Procedure Laterality Date  . Cholecystectomy    . Abdominal hysterectomy      Current Outpatient Prescriptions  Medication Sig Dispense Refill  . albuterol (PROVENTIL) (2.5 MG/3ML) 0.083% nebulizer solution Take 3 mLs (2.5 mg total) by nebulization 3  (three) times daily. YOU CAN ALSO GIVE YOURSELF ADDITIONAL NEBULIZER TREATMENTS AS NEEDED FOR INCREASED WHEEZING AND CHEST CONGESTION.  75 mL  1  . baclofen (LIORESAL) 10 MG tablet Take 10 mg by mouth 2 (two) times daily.      . Calcium Carbonate-Vitamin D (CALCIUM 600 + D PO) Take 1 tablet by mouth daily.      . carvedilol (COREG) 6.25 MG tablet Take 1 tablet (6.25 mg total) by mouth 2 (two) times daily.  60 tablet  2  . clotrimazole (LOTRIMIN) 1 % cream Apply 1 application topically 2 (two) times daily.      . docusate sodium (COLACE) 100 MG capsule Take 100 mg by mouth 2 (two) times daily.      . famotidine (PEPCID) 20 MG tablet Take 20 mg by mouth daily.      . furosemide (LASIX) 20 MG tablet Take 20 mg by mouth daily.      . latanoprost (XALATAN) 0.005 % ophthalmic solution Place 1 drop into both eyes at bedtime.      . LORazepam (ATIVAN) 1 MG tablet Take 1 tablet (1 mg total) by mouth 2 (two) times daily.      . Multiple Vitamin (DAILY VITE) TABS Take 1 tablet by mouth daily.      . potassium chloride SA (K-DUR,KLOR-CON) 20 MEQ tablet Take 20 mEq by mouth 2 (two) times daily.      . Probiotic Product (ALIGN) 4 MG CAPS Take 1 capsule by mouth daily.      .   simvastatin (ZOCOR) 20 MG tablet Take 20 mg by mouth at bedtime.      . SPIRIVA HANDIHALER 18 MCG inhalation capsule Place 18 mcg into inhaler and inhale daily.       . Linaclotide (LINZESS) 145 MCG CAPS Take 1 capsule (145 mcg total) by mouth daily. 30 minutes before breakfast. For constipation. Hold if loose stool.  30 capsule  3  . omeprazole (PRILOSEC) 20 MG capsule Take 1 capsule (20 mg total) by mouth daily.  30 capsule  3   No current facility-administered medications for this visit.    Allergies as of 11/27/2012  . (No Known Allergies)    Family History  Problem Relation Age of Onset  . Colon cancer Neg Hx     History   Social History  . Marital Status: Single    Spouse Name: N/A    Number of Children: N/A  . Years  of Education: N/A   Occupational History  . Not on file.   Social History Main Topics  . Smoking status: Never Smoker   . Smokeless tobacco: Not on file  . Alcohol Use: No  . Drug Use: No  . Sexually Active: Not Currently    Birth Control/ Protection: Post-menopausal   Other Topics Concern  . Not on file   Social History Narrative  . No narrative on file    Review of Systems: Gen: see HPI CV: Denies chest pain, heart palpitations, peripheral edema, syncope.  Resp: +DOE GI: see HPI GU : Denies urinary burning, urinary frequency, urinary hesitancy MS: +joint pain, wheelchair, no ambulation except for transferring self  Derm: Denies rash, itching, dry skin Psych: Denies depression, anxiety, memory loss, and confusion Heme: Denies bruising, bleeding, and enlarged lymph nodes.  Physical Exam: BP 129/70  Pulse 79  Temp(Src) 97.6 F (36.4 C) (Oral)  Ht 5' 6" (1.676 m)  Wt 219 lb (99.338 kg)  BMI 35.36 kg/m2 General:   Alert and oriented. Pleasant and cooperative. Well-nourished and well-developed. Head bowed in chair, unable to raise due to MD Head:  Normocephalic and atraumatic. Eyes:  Without icterus, sclera clear and conjunctiva pink.  Ears:  Normal auditory acuity. Nose:  No deformity, discharge,  or lesions. Mouth:  No deformity or lesions, oral mucosa pink.  Neck:  Supple, without mass or thyromegaly. Lungs:  Clear to auscultation bilaterally. Diminished in bases Heart:  S1, S2 present without murmurs appreciated.  Abdomen:  Limited exam as patient was in wheelchair. +BS, soft, non-tender and non-distended. Unable to appreciate HSM Rectal:  Deferred  Msk:  Kyphosis, generalized weakness  Extremities:  1+ edema bilateral lower extremities Neurologic:  Alert and  oriented x4 Skin:  Intact without significant lesions or rashes. Cervical Nodes:  No significant cervical adenopathy. Psych:  Alert and cooperative. Normal mood and affect.  June 2014 LABS: Hgb  11.8 Hct 36.7 PLT 151    

## 2012-11-27 NOTE — Progress Notes (Signed)
Cc PCP 

## 2012-11-28 ENCOUNTER — Encounter (HOSPITAL_COMMUNITY): Payer: Self-pay | Admitting: Pharmacist

## 2012-11-28 ENCOUNTER — Other Ambulatory Visit: Payer: Self-pay | Admitting: Internal Medicine

## 2012-11-28 DIAGNOSIS — K5900 Constipation, unspecified: Secondary | ICD-10-CM

## 2012-11-28 DIAGNOSIS — K219 Gastro-esophageal reflux disease without esophagitis: Secondary | ICD-10-CM

## 2012-11-28 DIAGNOSIS — R195 Other fecal abnormalities: Secondary | ICD-10-CM

## 2012-11-28 MED ORDER — PEG-KCL-NACL-NASULF-NA ASC-C 100 G PO SOLR: 1.0000 | ORAL | Status: DC

## 2012-11-28 NOTE — Telephone Encounter (Signed)
Patient is scheduled for Thursday July 24th w/RMR instructions have been faxed to Lupita Leash at [facility] at her POA is also aware

## 2012-12-01 NOTE — Addendum Note (Signed)
Addended by: Jennings Books on: 12/01/2012 11:55 AM   Modules accepted: Orders

## 2012-12-05 ENCOUNTER — Telehealth: Payer: Self-pay

## 2012-12-05 MED ORDER — LUBIPROSTONE 8 MCG PO CAPS: 8.0000 ug | ORAL_CAPSULE | Freq: Two times a day (BID) | ORAL | Status: DC

## 2012-12-05 NOTE — Telephone Encounter (Signed)
Open in error

## 2012-12-05 NOTE — Telephone Encounter (Signed)
Pt can not afford the Linzess 145 mcg. Is there something different she can use? Please advise

## 2012-12-05 NOTE — Telephone Encounter (Signed)
Pt's care taker is aware

## 2012-12-05 NOTE — Telephone Encounter (Signed)
May trial Amitiza 8 mcg po BID. I sent to pharmacy.

## 2012-12-06 MED ORDER — PEG 3350-KCL-NA BICARB-NACL 420 G PO SOLR
4000.0000 mL | Freq: Once | ORAL | Status: AC
  Administered 2012-12-07: 4000 mL via ORAL
  Filled 2012-12-06: qty 4000

## 2012-12-06 NOTE — Telephone Encounter (Signed)
T/C from [place] [facility] and said copay on Amitiza will be $170.00. Pt requesting something cheaper. ( [facility] call back number is [phone]).

## 2012-12-07 ENCOUNTER — Ambulatory Visit (HOSPITAL_COMMUNITY)
Admission: RE | Admit: 2012-12-07 | Discharge: 2012-12-07 | Disposition: A | Discharge status: Skilled Nursing Facility | Payer: Medicare Other | Source: Ambulatory Visit | Attending: Internal Medicine | Admitting: Internal Medicine

## 2012-12-07 ENCOUNTER — Encounter (HOSPITAL_COMMUNITY)
Admission: RE | Disposition: A | Discharge status: Skilled Nursing Facility | Payer: Self-pay | Source: Ambulatory Visit | Attending: Internal Medicine

## 2012-12-07 ENCOUNTER — Encounter (HOSPITAL_COMMUNITY): Payer: Self-pay | Admitting: *Deleted

## 2012-12-07 DIAGNOSIS — D175 Benign lipomatous neoplasm of intra-abdominal organs: Secondary | ICD-10-CM | POA: Insufficient documentation

## 2012-12-07 DIAGNOSIS — K2941 Chronic atrophic gastritis with bleeding: Secondary | ICD-10-CM | POA: Insufficient documentation

## 2012-12-07 DIAGNOSIS — D649 Anemia, unspecified: Secondary | ICD-10-CM | POA: Insufficient documentation

## 2012-12-07 DIAGNOSIS — D131 Benign neoplasm of stomach: Secondary | ICD-10-CM

## 2012-12-07 DIAGNOSIS — K921 Melena: Secondary | ICD-10-CM | POA: Insufficient documentation

## 2012-12-07 DIAGNOSIS — K219 Gastro-esophageal reflux disease without esophagitis: Secondary | ICD-10-CM

## 2012-12-07 DIAGNOSIS — R195 Other fecal abnormalities: Secondary | ICD-10-CM

## 2012-12-07 DIAGNOSIS — K449 Diaphragmatic hernia without obstruction or gangrene: Secondary | ICD-10-CM

## 2012-12-07 DIAGNOSIS — J4489 Other specified chronic obstructive pulmonary disease: Secondary | ICD-10-CM | POA: Insufficient documentation

## 2012-12-07 DIAGNOSIS — I10 Essential (primary) hypertension: Secondary | ICD-10-CM | POA: Insufficient documentation

## 2012-12-07 DIAGNOSIS — J449 Chronic obstructive pulmonary disease, unspecified: Secondary | ICD-10-CM | POA: Insufficient documentation

## 2012-12-07 DIAGNOSIS — K5900 Constipation, unspecified: Secondary | ICD-10-CM

## 2012-12-07 HISTORY — PX: ESOPHAGOGASTRODUODENOSCOPY: SHX5428

## 2012-12-07 HISTORY — PX: COLONOSCOPY: SHX5424

## 2012-12-07 SURGERY — COLONOSCOPY
Anesthesia: Moderate Sedation

## 2012-12-07 MED ORDER — SODIUM CHLORIDE 0.9 % IV SOLN
INTRAVENOUS | Status: DC
  Administered 2012-12-07: 11:00:00 via INTRAVENOUS

## 2012-12-07 MED ORDER — MIDAZOLAM HCL 5 MG/5ML IJ SOLN
INTRAMUSCULAR | Status: AC
  Filled 2012-12-07: qty 10

## 2012-12-07 MED ORDER — ONDANSETRON HCL 4 MG/2ML IJ SOLN
INTRAMUSCULAR | Status: AC
  Filled 2012-12-07: qty 2

## 2012-12-07 MED ORDER — MEPERIDINE HCL 100 MG/ML IJ SOLN
INTRAMUSCULAR | Status: DC | PRN
  Administered 2012-12-07: 25 mg via INTRAVENOUS
  Administered 2012-12-07: 50 mg via INTRAVENOUS

## 2012-12-07 MED ORDER — MIDAZOLAM HCL 5 MG/5ML IJ SOLN
INTRAMUSCULAR | Status: DC | PRN
  Administered 2012-12-07: 2 mg via INTRAVENOUS
  Administered 2012-12-07 (×2): 1 mg via INTRAVENOUS
  Administered 2012-12-07: 2 mg via INTRAVENOUS

## 2012-12-07 MED ORDER — BUTAMBEN-TETRACAINE-BENZOCAINE 2-2-14 % EX AERO
INHALATION_SPRAY | CUTANEOUS | Status: DC | PRN
  Administered 2012-12-07: 2 via TOPICAL

## 2012-12-07 MED ORDER — ONDANSETRON HCL 4 MG/2ML IJ SOLN
INTRAMUSCULAR | Status: DC | PRN
  Administered 2012-12-07: 4 mg via INTRAVENOUS

## 2012-12-07 MED ORDER — MEPERIDINE HCL 100 MG/ML IJ SOLN
INTRAMUSCULAR | Status: AC
  Filled 2012-12-07: qty 1

## 2012-12-07 MED ORDER — STERILE WATER FOR IRRIGATION IR SOLN
Status: DC | PRN
  Administered 2012-12-07: 15:00:00

## 2012-12-07 MED ORDER — MIDAZOLAM HCL 5 MG/5ML IJ SOLN
INTRAMUSCULAR | Status: AC
  Filled 2012-12-07: qty 5

## 2012-12-07 NOTE — H&P (View-Only) (Signed)
Primary Care Physician:  Catalina Pizza, MD Primary Gastroenterologist:  Dr. Jena Gauss   Chief Complaint  Patient presents with  . Anemia    HPI:   [name] is a 67 year old female presenting today at the request of Dr. Margo Aye secondary to anemia and heme positive stool. Outside CBC includes mild normocytic anemia with Hgb 11.8. No hematochezia or melena. Notes chronic LLQ soreness, intermittent, worsened with seeds/corn/popcorn. Relieved with defecation.   Denies weight loss, poor appetite. No N/V. Notes occasional dizziness. +reflux. Notes new onset of belching. Pepcid daily with continued symptoms. No dysphagia. Occasional fecal incontinence. "messed up clothes" twice in the past few days. Has to wait for someone to assist her. Has intermittent hard stools alternating with loose stools. On stool softener now. Trends towards more constipation. States she has to strain a lot.   Used to live with her 3 sisters, now all deceased. Lives now in [place] for past 2.5 years.    Last colonoscopy greater than 10 years ago in [place]. Denies history of polyps. No prior EGD.   Past Medical History  Diagnosis Date  . Muscular dystrophy   . COPD (chronic obstructive pulmonary disease)   . Asthma   . Hypoxemia   . CHF (congestive heart failure)   . Chronic pain   . Hypertension   . Glaucoma   . Muscle weakness   . Chronic respiratory failure with hypercapnia     And hypoxia  . Atelectasis 11/21/2011  . Lung nodule 11/21/2011  . Thyroid nodule 11/21/2011  . Compensated alkalosis 11/21/2011  . Pneumonia 11/21/2011  . Hyponatremia Recurrent    Normalizes with normal saline  . GERD (gastroesophageal reflux disease)     Past Surgical History  Procedure Laterality Date  . Cholecystectomy    . Abdominal hysterectomy      Current Outpatient Prescriptions  Medication Sig Dispense Refill  . albuterol (PROVENTIL) (2.5 MG/3ML) 0.083% nebulizer solution Take 3 mLs (2.5 mg total) by nebulization 3  (three) times daily. YOU CAN ALSO GIVE YOURSELF ADDITIONAL NEBULIZER TREATMENTS AS NEEDED FOR INCREASED WHEEZING AND CHEST CONGESTION.  75 mL  1  . baclofen (LIORESAL) 10 MG tablet Take 10 mg by mouth 2 (two) times daily.      . Calcium Carbonate-Vitamin D (CALCIUM 600 + D PO) Take 1 tablet by mouth daily.      . carvedilol (COREG) 6.25 MG tablet Take 1 tablet (6.25 mg total) by mouth 2 (two) times daily.  60 tablet  2  . clotrimazole (LOTRIMIN) 1 % cream Apply 1 application topically 2 (two) times daily.      Marland Kitchen docusate sodium (COLACE) 100 MG capsule Take 100 mg by mouth 2 (two) times daily.      . famotidine (PEPCID) 20 MG tablet Take 20 mg by mouth daily.      . furosemide (LASIX) 20 MG tablet Take 20 mg by mouth daily.      Marland Kitchen latanoprost (XALATAN) 0.005 % ophthalmic solution Place 1 drop into both eyes at bedtime.      Marland Kitchen LORazepam (ATIVAN) 1 MG tablet Take 1 tablet (1 mg total) by mouth 2 (two) times daily.      . Multiple Vitamin (DAILY VITE) TABS Take 1 tablet by mouth daily.      . potassium chloride SA (K-DUR,KLOR-CON) 20 MEQ tablet Take 20 mEq by mouth 2 (two) times daily.      . Probiotic Product (ALIGN) 4 MG CAPS Take 1 capsule by mouth daily.      Marland Kitchen  simvastatin (ZOCOR) 20 MG tablet Take 20 mg by mouth at bedtime.      Marland Kitchen SPIRIVA HANDIHALER 18 MCG inhalation capsule Place 18 mcg into inhaler and inhale daily.       . Linaclotide (LINZESS) 145 MCG CAPS Take 1 capsule (145 mcg total) by mouth daily. 30 minutes before breakfast. For constipation. Hold if loose stool.  30 capsule  3  . omeprazole (PRILOSEC) 20 MG capsule Take 1 capsule (20 mg total) by mouth daily.  30 capsule  3   No current facility-administered medications for this visit.    Allergies as of 11/27/2012  . (No Known Allergies)    Family History  Problem Relation Age of Onset  . Colon cancer Neg Hx     History   Social History  . Marital Status: Single    Spouse Name: N/A    Number of Children: N/A  . Years  of Education: N/A   Occupational History  . Not on file.   Social History Main Topics  . Smoking status: Never Smoker   . Smokeless tobacco: Not on file  . Alcohol Use: No  . Drug Use: No  . Sexually Active: Not Currently    Birth Control/ Protection: Post-menopausal   Other Topics Concern  . Not on file   Social History Narrative  . No narrative on file    Review of Systems: Gen: see HPI CV: Denies chest pain, heart palpitations, peripheral edema, syncope.  Resp: +DOE GI: see HPI GU : Denies urinary burning, urinary frequency, urinary hesitancy MS: +joint pain, wheelchair, no ambulation except for transferring self  Derm: Denies rash, itching, dry skin Psych: Denies depression, anxiety, memory loss, and confusion Heme: Denies bruising, bleeding, and enlarged lymph nodes.  Physical Exam: BP 129/70  Pulse 79  Temp(Src) 97.6 F (36.4 C) (Oral)  Ht 5\' 6"  (1.676 m)  Wt 219 lb (99.338 kg)  BMI 35.36 kg/m2 General:   Alert and oriented. Pleasant and cooperative. Well-nourished and well-developed. Head bowed in chair, unable to raise due to MD Head:  Normocephalic and atraumatic. Eyes:  Without icterus, sclera clear and conjunctiva pink.  Ears:  Normal auditory acuity. Nose:  No deformity, discharge,  or lesions. Mouth:  No deformity or lesions, oral mucosa pink.  Neck:  Supple, without mass or thyromegaly. Lungs:  Clear to auscultation bilaterally. Diminished in bases Heart:  S1, S2 present without murmurs appreciated.  Abdomen:  Limited exam as patient was in wheelchair. +BS, soft, non-tender and non-distended. Unable to appreciate HSM Rectal:  Deferred  Msk:  Kyphosis, generalized weakness  Extremities:  1+ edema bilateral lower extremities Neurologic:  Alert and  oriented x4 Skin:  Intact without significant lesions or rashes. Cervical Nodes:  No significant cervical adenopathy. Psych:  Alert and cooperative. Normal mood and affect.  June 2014 LABS: Hgb  11.8 Hct 36.7 PLT 151

## 2012-12-07 NOTE — Interval H&P Note (Signed)
History and Physical Interval Note:  12/07/2012 2:33 PM  Karen Collier  has presented today for surgery, with the diagnosis of Heme Positive Stool, Unspecified Constipation and GERD  The various methods of treatment have been discussed with the patient and family. After consideration of risks, benefits and other options for treatment, the patient has consented to  Procedure(s) with comments: COLONOSCOPY (N/A) - 1:00-moved to 1:15 Soledad Gerlach to notify pt ESOPHAGOGASTRODUODENOSCOPY (EGD) (N/A) as a surgical intervention .  The patient's history has been reviewed, patient examined, no change in status, stable for surgery.  I have reviewed the patient's chart and labs.  Questions were answered to the patient's satisfaction.     Eula Listen  Patient seen and examined. No change. Colonoscopy with possible EGD to follow secondary Hemoccult-positive stool and anemia

## 2012-12-07 NOTE — Op Note (Signed)
[facility] [address] [place], [address]   COLONOSCOPY PROCEDURE REPORT  PATIENT: Karen, Collier  MR#:         [id number] BIRTHDATE: November 17, 1945 , 66  yrs. old GENDER: Female ENDOSCOPIST: R.  Roetta Sessions, MD FACP FACG REFERRED BY:  Catalina Pizza, M.D. PROCEDURE DATE:  12/07/2012 PROCEDURE:     Ileocolonoscopy-diagnostic  INDICATIONS: anemia; Hemoccult positive stool  INFORMED CONSENT:  The risks, benefits, alternatives and imponderables including but not limited to bleeding, perforation as well as the possibility of a missed lesion have been reviewed.  The potential for biopsy, lesion removal, etc. have also been discussed.  Questions have been answered.  All parties agreeable. Please see the history and physical in the medical record for more information.  MEDICATIONS: Versed 5 mg IV and and Demerol  75 mg IV in divided doses.  Zofran 4 mg IV.  DESCRIPTION OF PROCEDURE:  After a digital rectal exam was performed, the EC-[id number] ([id number])  colonoscope was advanced from the anus through the rectum and colon to the area of the cecum, ileocecal valve and appendiceal orifice.  The cecum was deeply intubated.  These structures were well-seen and photographed for the record.  From the level of the cecum and ileocecal valve, the scope was slowly and cautiously withdrawn.  The mucosal surfaces were carefully surveyed utilizing scope tip deflection to facilitate fold flattening as needed.  The scope was pulled down into the rectum where a thorough examination including retroflexion was performed.    FINDINGS:  Adequate preparation. Normal rectum. Tortuous colon. Normal colonic mucosa. There was a 1 cm yellowish submucosal nodule in the mid ascending segment (positive pillows sign) consistent with a lipoma; otherwise, the remainder of the colonic mucosa appeared normal. The distal 10 cm of terminal ileal mucosa also appeared normal  THERAPEUTIC / DIAGNOSTIC  MANEUVERS PERFORMED:  None  COMPLICATIONS: none  CECAL WITHDRAWAL TIME:  7 minutes  IMPRESSION:  Colonic lipoma; otherwise negative examination.  RECOMMENDATIONS: See EGD report   _______________________________ eSigned:  R. Roetta Sessions, MD FACP [facility] 12/07/2012 3:13 PM   CC:    PATIENT NAME:  Karen Collier, Karen Collier MR#: [id number]

## 2012-12-07 NOTE — Telephone Encounter (Signed)
I called [facility] and spoke to the pharmacist, Ayesha Rumpf. He said it is not a prior authorization, that is just how much pt's copay would be. If you send in Rx for the Miralax, please write DC Linzess and Amitiza.

## 2012-12-07 NOTE — Op Note (Signed)
[facility] [address] [place], [address]   ENDOSCOPY PROCEDURE REPORT  PATIENT: Karen, Collier  MR#: [id number] BIRTHDATE: 04-07-1946 , 66  yrs. old GENDER: Female ENDOSCOPIST: R.  Roetta Sessions, MD FACP FACG REFERRED BY:  Catalina Pizza, M.D. PROCEDURE DATE:  12/07/2012 PROCEDURE:     EGD with gastric biopsy  INDICATIONS:     Anemia;  Hemoccult positive stool  (negative colonoscopy)  INFORMED CONSENT:   The risks, benefits, limitations, alternatives and imponderables have been discussed.  The potential for biopsy, esophogeal dilation, etc. have also been reviewed.  Questions have been answered.  All parties agreeable.  Please see the history and physical in the medical record for more information.  MEDICATIONS:    Versed  6 mg IV and Demerol 75 mg  IV in divided doses. Zofran 4 mg IV. Cetacaine spray.  DESCRIPTION OF PROCEDURE:   The EG-[id number] ([id number])  endoscope was introduced through the mouth and advanced to the second portion of the duodenum without difficulty or limitations.  The mucosal surfaces were surveyed very carefully during advancement of the scope and upon withdrawal.  Retroflexion view of the proximal stomach and esophagogastric junction was performed.      FINDINGS: Normal esophagus. Stomach empty. Small hiatal hernia. Multiple 1-3 mm benign-appearing fundal gland polyps present. Gastric mucosa otherwise appeared normal. Patent pylorus. Normal first and second portion of the duodenum  THERAPEUTIC / DIAGNOSTIC MANEUVERS PERFORMED:  One of the polyps was biopsied for histologic study. Also gastric mucosa was biopsied for histologic study   COMPLICATIONS:  None  IMPRESSION:   Gastric polyps. Hiatal hernia. Status post gastric biopsy  RECOMMENDATIONS:  Followup on pathology. See colonoscopy.    _______________________________ R. Roetta Sessions, MD FACP [facility] eSigned:  R. Roetta Sessions, MD FACP [facility] 12/07/2012 3:28  PM     CC:

## 2012-12-07 NOTE — Telephone Encounter (Signed)
We need to find out if this is a prior authorization issue or not. Shouldn't be that expensive. Appears she has Medicare. Is she in the donut hole? If so, she will have to pay out of pocket until she is out of it. In that case, the only other option is Miralax 1 capful daily.

## 2012-12-08 LAB — CBC: platelet count: 151

## 2012-12-11 ENCOUNTER — Encounter (HOSPITAL_COMMUNITY): Payer: Self-pay | Admitting: Internal Medicine

## 2012-12-11 MED ORDER — POLYETHYLENE GLYCOL 3350 17 GM/SCOOP PO POWD: 17.0000 g | Freq: Every day | ORAL | Status: DC

## 2012-12-11 NOTE — Telephone Encounter (Signed)
Faxed a DC order to [facility] for the Amitiza and Linzess.

## 2012-12-11 NOTE — Addendum Note (Signed)
Addended by: Nira Retort on: 12/11/2012 01:15 PM   Modules accepted: Orders

## 2012-12-11 NOTE — Telephone Encounter (Signed)
Karen Collier at [facility] has received the info and RX.

## 2012-12-11 NOTE — Telephone Encounter (Signed)
I sent prescription for Miralax to pharmacy.   Can we write on an order form to D/C Linzess and Amitiza?

## 2012-12-12 ENCOUNTER — Telehealth: Payer: Self-pay | Admitting: General Practice

## 2012-12-12 ENCOUNTER — Encounter: Payer: Self-pay | Admitting: Internal Medicine

## 2012-12-12 NOTE — Telephone Encounter (Signed)
Patient called and wanted path results from her EGD done last week.

## 2012-12-12 NOTE — Telephone Encounter (Signed)
Colon is aware to D/C the miralax

## 2012-12-12 NOTE — Telephone Encounter (Signed)
Colon, called from [facility] and would like to have a D/C order for the miralax. Please advsie

## 2012-12-12 NOTE — Telephone Encounter (Signed)
Yes, d/c  Miralax. Where do we get these orders

## 2012-12-12 NOTE — Telephone Encounter (Signed)
Called and informed POA of results (pt is a resident at [facility]) she stated pts insurance is not paying for the Linzess its 200.00 a month and pt informed her that she was having diarrhea and was having episodes of incontinence, pt had to change clothes twice yesterday. They are wanting to know if we can call [facility] and D/C the Linzess.

## 2012-12-12 NOTE — Telephone Encounter (Signed)
Routing to RMR for review. 

## 2013-01-22 ENCOUNTER — Encounter: Payer: Self-pay | Admitting: Internal Medicine

## 2013-01-23 ENCOUNTER — Ambulatory Visit (INDEPENDENT_AMBULATORY_CARE_PROVIDER_SITE_OTHER): Payer: Medicare Other | Admitting: Gastroenterology

## 2013-01-23 ENCOUNTER — Encounter: Payer: Self-pay | Admitting: Gastroenterology

## 2013-01-23 ENCOUNTER — Ambulatory Visit: Payer: Medicare Other | Admitting: Gastroenterology

## 2013-01-23 VITALS — BP 132/70 | HR 84 | Temp 98.3°F | Ht 67.0 in

## 2013-01-23 DIAGNOSIS — K5900 Constipation, unspecified: Secondary | ICD-10-CM

## 2013-01-23 DIAGNOSIS — D649 Anemia, unspecified: Secondary | ICD-10-CM

## 2013-01-23 MED ORDER — POLYETHYLENE GLYCOL 3350 17 GM/SCOOP PO POWD: 17.0000 g | Freq: Every day | ORAL | Status: AC

## 2013-01-23 NOTE — Patient Instructions (Addendum)
Take Miralax daily if needed to have a bowel movement.   We will see you back in 6 months.  Please take the blood work to Dr. Margo Aye on Thursday.   Please call if you have any further abdominal discomfort despite good bowel regimen and productive bowel movements.

## 2013-01-24 ENCOUNTER — Encounter: Payer: Self-pay | Admitting: Gastroenterology

## 2013-01-24 DIAGNOSIS — D649 Anemia, unspecified: Secondary | ICD-10-CM | POA: Insufficient documentation

## 2013-01-24 NOTE — Assessment & Plan Note (Signed)
Recheck CBC now. No evidence of overt GI bleeding. TCS/EGD unrevealing. Likely heme positive stool benign finding. Consider capsule endoscopy if persistent or worsening anemia or evidence of IDA.

## 2013-01-24 NOTE — Progress Notes (Signed)
Referring Provider: Catalina Pizza, MD Primary Care Physician:  Catalina Pizza, MD Primary GI: Dr. Jena Gauss   Chief Complaint  Patient presents with  . follow up    HPI:   [name] returns today in follow-up after colonoscopy with EGD due to heme positive stool and mild normocytic anemia. No concerning findings noted.  Patient has no evidence of overt GI bleeding. Unable to afford Amitiza or Linzess. Notes intermittent LLQ discomfort, "like I've been exercising too much". This is chronic. Intermittent constipation. NO DISCOMFORT if she is not constipated. Notes significant improvement in GERD symptoms since instituting PPI.   Past Medical History  Diagnosis Date  . Muscular dystrophy   . COPD (chronic obstructive pulmonary disease)   . Asthma   . Hypoxemia   . CHF (congestive heart failure)   . Chronic pain   . Hypertension   . Glaucoma   . Muscle weakness   . Chronic respiratory failure with hypercapnia     And hypoxia  . Atelectasis 11/21/2011  . Lung nodule 11/21/2011  . Thyroid nodule 11/21/2011  . Compensated alkalosis 11/21/2011  . Pneumonia 11/21/2011  . Hyponatremia Recurrent    Normalizes with normal saline  . GERD (gastroesophageal reflux disease)     Past Surgical History  Procedure Laterality Date  . Cholecystectomy    . Abdominal hysterectomy    . Colonoscopy N/A 12/07/2012    [id number] lipoma; otherwise negative examination  . Esophagogastroduodenoscopy N/A 12/07/2012    [id number] polyps. Hiatal hernia. Status post gastric bx, chronic gastritis noted.     Current Outpatient Prescriptions  Medication Sig Dispense Refill  . albuterol (PROVENTIL) (2.5 MG/3ML) 0.083% nebulizer solution Take 2.5 mg by nebulization 3 (three) times daily as needed for shortness of breath.      . baclofen (LIORESAL) 10 MG tablet Take 10 mg by mouth 2 (two) times daily.      . Calcium Carbonate-Vitamin D (CALCIUM 600 + D PO) Take 1 tablet by mouth daily.      . carvedilol (COREG) 6.25 MG tablet  Take 1 tablet (6.25 mg total) by mouth 2 (two) times daily.  60 tablet  2  . docusate sodium (COLACE) 100 MG capsule Take 100 mg by mouth at bedtime.       . fexofenadine (ALLEGRA) 180 MG tablet Take 180 mg by mouth daily.      . furosemide (LASIX) 20 MG tablet Take 20 mg by mouth daily.      Marland Kitchen latanoprost (XALATAN) 0.005 % ophthalmic solution Place 1 drop into both eyes at bedtime.      Marland Kitchen LORazepam (ATIVAN) 1 MG tablet Take 1 tablet (1 mg total) by mouth 2 (two) times daily.      . Multiple Vitamin (MULTIVITAMIN WITH MINERALS) TABS Take 1 tablet by mouth daily.      . naproxen (NAPROSYN) 500 MG tablet Take 500 mg by mouth 2 (two) times daily with a meal.      . omeprazole (PRILOSEC) 20 MG capsule Take 1 capsule (20 mg total) by mouth daily.  30 capsule  3  . potassium chloride SA (K-DUR,KLOR-CON) 20 MEQ tablet Take 20 mEq by mouth 2 (two) times daily.      . Probiotic Product (ALIGN) 4 MG CAPS Take 1 capsule by mouth daily.      . simvastatin (ZOCOR) 20 MG tablet Take 20 mg by mouth at bedtime.      Marland Kitchen SPIRIVA HANDIHALER 18 MCG inhalation capsule Place 18 mcg into inhaler and  inhale daily.       . polyethylene glycol powder (GLYCOLAX/MIRALAX) powder Take 17 g by mouth daily. As needed for a bowel movement daily.  255 g  11   No current facility-administered medications for this visit.    Allergies as of 01/23/2013  . (No Known Allergies)    Family History  Problem Relation Age of Onset  . Colon cancer Neg Hx     History   Social History  . Marital Status: Single    Spouse Name: N/A    Number of Children: N/A  . Years of Education: N/A   Social History Main Topics  . Smoking status: Never Smoker   . Smokeless tobacco: None  . Alcohol Use: No  . Drug Use: No  . Sexual Activity: Not Currently    Birth Control/ Protection: Post-menopausal   Other Topics Concern  . None   Social History Narrative  . None    Review of Systems: Negative unless mentioned in HPI.    Physical Exam: BP 132/70  Pulse 84  Temp(Src) 98.3 F (36.8 C)  Ht 5\' 7"  (1.702 m) General:   Alert and oriented. No distress noted. Pleasant and cooperative.  Head:  Normocephalic and atraumatic. Heart:  S1, S2 present without murmurs, rubs, or gallops.  Abdomen: limited exam, patient in wheelchair.  +BS, soft, non-tender and non-distended. No rebound or guarding.  Msk:  kyphosis Extremities:  2+ bilateral lower extremity edema Neurologic:  Alert and  oriented x4;  grossly normal neurologically. Skin:  Intact without significant lesions or rashes. Psych:  Alert and cooperative. Normal mood and affect.

## 2013-01-24 NOTE — Assessment & Plan Note (Signed)
Unable to afford Amitiza or Linzess. Miralax daily as needed for BM. 6 month return.

## 2013-01-24 NOTE — Progress Notes (Signed)
cc'd to pcp 

## 2013-03-26 ENCOUNTER — Inpatient Hospital Stay (HOSPITAL_COMMUNITY)
Admission: EM | Admit: 2013-03-26 | Discharge: 2013-03-29 | DRG: 291 | Disposition: A | Discharge status: Nursing Home (Includes ICF) | Payer: Medicare Other | Attending: Internal Medicine | Admitting: Internal Medicine

## 2013-03-26 ENCOUNTER — Emergency Department (HOSPITAL_COMMUNITY): Payer: Medicare Other

## 2013-03-26 ENCOUNTER — Encounter (HOSPITAL_COMMUNITY): Payer: Self-pay | Admitting: Emergency Medicine

## 2013-03-26 DIAGNOSIS — Z823 Family history of stroke: Secondary | ICD-10-CM

## 2013-03-26 DIAGNOSIS — Z801 Family history of malignant neoplasm of trachea, bronchus and lung: Secondary | ICD-10-CM

## 2013-03-26 DIAGNOSIS — J811 Chronic pulmonary edema: Secondary | ICD-10-CM

## 2013-03-26 DIAGNOSIS — R0689 Other abnormalities of breathing: Secondary | ICD-10-CM

## 2013-03-26 DIAGNOSIS — G8929 Other chronic pain: Secondary | ICD-10-CM | POA: Diagnosis present

## 2013-03-26 DIAGNOSIS — J440 Chronic obstructive pulmonary disease with acute lower respiratory infection: Secondary | ICD-10-CM | POA: Diagnosis present

## 2013-03-26 DIAGNOSIS — R911 Solitary pulmonary nodule: Secondary | ICD-10-CM

## 2013-03-26 DIAGNOSIS — J9819 Other pulmonary collapse: Secondary | ICD-10-CM

## 2013-03-26 DIAGNOSIS — E041 Nontoxic single thyroid nodule: Secondary | ICD-10-CM

## 2013-03-26 DIAGNOSIS — Z23 Encounter for immunization: Secondary | ICD-10-CM

## 2013-03-26 DIAGNOSIS — J209 Acute bronchitis, unspecified: Secondary | ICD-10-CM | POA: Diagnosis present

## 2013-03-26 DIAGNOSIS — J9612 Chronic respiratory failure with hypercapnia: Secondary | ICD-10-CM | POA: Diagnosis present

## 2013-03-26 DIAGNOSIS — G7109 Other specified muscular dystrophies: Secondary | ICD-10-CM | POA: Diagnosis present

## 2013-03-26 DIAGNOSIS — E873 Alkalosis: Secondary | ICD-10-CM

## 2013-03-26 DIAGNOSIS — K5900 Constipation, unspecified: Secondary | ICD-10-CM

## 2013-03-26 DIAGNOSIS — I517 Cardiomegaly: Secondary | ICD-10-CM

## 2013-03-26 DIAGNOSIS — J4489 Other specified chronic obstructive pulmonary disease: Secondary | ICD-10-CM | POA: Diagnosis present

## 2013-03-26 DIAGNOSIS — K219 Gastro-esophageal reflux disease without esophagitis: Secondary | ICD-10-CM | POA: Diagnosis present

## 2013-03-26 DIAGNOSIS — J449 Chronic obstructive pulmonary disease, unspecified: Secondary | ICD-10-CM | POA: Diagnosis present

## 2013-03-26 DIAGNOSIS — Z8679 Personal history of other diseases of the circulatory system: Secondary | ICD-10-CM

## 2013-03-26 DIAGNOSIS — I5031 Acute diastolic (congestive) heart failure: Secondary | ICD-10-CM

## 2013-03-26 DIAGNOSIS — H409 Unspecified glaucoma: Secondary | ICD-10-CM | POA: Diagnosis present

## 2013-03-26 DIAGNOSIS — R195 Other fecal abnormalities: Secondary | ICD-10-CM

## 2013-03-26 DIAGNOSIS — Z79899 Other long term (current) drug therapy: Secondary | ICD-10-CM

## 2013-03-26 DIAGNOSIS — D649 Anemia, unspecified: Secondary | ICD-10-CM | POA: Diagnosis present

## 2013-03-26 DIAGNOSIS — J9811 Atelectasis: Secondary | ICD-10-CM

## 2013-03-26 DIAGNOSIS — R0600 Dyspnea, unspecified: Secondary | ICD-10-CM | POA: Diagnosis present

## 2013-03-26 DIAGNOSIS — J189 Pneumonia, unspecified organism: Secondary | ICD-10-CM

## 2013-03-26 DIAGNOSIS — I10 Essential (primary) hypertension: Secondary | ICD-10-CM | POA: Diagnosis present

## 2013-03-26 DIAGNOSIS — D696 Thrombocytopenia, unspecified: Secondary | ICD-10-CM | POA: Diagnosis present

## 2013-03-26 DIAGNOSIS — J9620 Acute and chronic respiratory failure, unspecified whether with hypoxia or hypercapnia: Secondary | ICD-10-CM

## 2013-03-26 DIAGNOSIS — J9610 Chronic respiratory failure, unspecified whether with hypoxia or hypercapnia: Secondary | ICD-10-CM | POA: Diagnosis present

## 2013-03-26 DIAGNOSIS — I509 Heart failure, unspecified: Secondary | ICD-10-CM | POA: Diagnosis present

## 2013-03-26 DIAGNOSIS — E871 Hypo-osmolality and hyponatremia: Secondary | ICD-10-CM

## 2013-03-26 DIAGNOSIS — E876 Hypokalemia: Secondary | ICD-10-CM

## 2013-03-26 DIAGNOSIS — G7100 Muscular dystrophy, unspecified: Secondary | ICD-10-CM | POA: Diagnosis present

## 2013-03-26 HISTORY — DX: Acute diastolic (congestive) heart failure: I50.31

## 2013-03-26 LAB — CBC WITH DIFFERENTIAL/PLATELET
Basophils Absolute: 0 10*3/uL (ref 0.0–0.1)
HCT: 38.2 % (ref 36.0–46.0)
Hemoglobin: 11.6 g/dL — ABNORMAL LOW (ref 12.0–15.0)
Lymphocytes Relative: 14 % (ref 12–46)
Monocytes Absolute: 0.2 10*3/uL (ref 0.1–1.0)
Monocytes Relative: 3 % (ref 3–12)
Neutro Abs: 6.1 10*3/uL (ref 1.7–7.7)
Neutrophils Relative %: 81 % — ABNORMAL HIGH (ref 43–77)
WBC: 7.5 10*3/uL (ref 4.0–10.5)

## 2013-03-26 LAB — BASIC METABOLIC PANEL
BUN: 11 mg/dL (ref 6–23)
CO2: 44 mEq/L (ref 19–32)
Chloride: 97 mEq/L (ref 96–112)
Creatinine, Ser: 0.31 mg/dL — ABNORMAL LOW (ref 0.50–1.10)
Potassium: 4.3 mEq/L (ref 3.5–5.1)

## 2013-03-26 LAB — PRO B NATRIURETIC PEPTIDE: Pro B Natriuretic peptide (BNP): 85.2 pg/mL (ref 0–125)

## 2013-03-26 LAB — TROPONIN I: Troponin I: <0.3 ng/mL (ref <0.30)

## 2013-03-26 MED ORDER — SODIUM CHLORIDE 0.9 % IJ SOLN
3.0000 mL | Freq: Two times a day (BID) | INTRAMUSCULAR | Status: DC
  Administered 2013-03-26 – 2013-03-29 (×5): 3 mL via INTRAVENOUS

## 2013-03-26 MED ORDER — DOCUSATE SODIUM 100 MG PO CAPS
100.0000 mg | ORAL_CAPSULE | Freq: Every day | ORAL | Status: DC
  Administered 2013-03-26 – 2013-03-28 (×3): 100 mg via ORAL
  Filled 2013-03-26 (×3): qty 1

## 2013-03-26 MED ORDER — ONDANSETRON HCL 4 MG PO TABS: 4.0000 mg | ORAL_TABLET | Freq: Four times a day (QID) | ORAL | Status: DC | PRN

## 2013-03-26 MED ORDER — LORAZEPAM 1 MG PO TABS
1.0000 mg | ORAL_TABLET | Freq: Two times a day (BID) | ORAL | Status: DC
  Administered 2013-03-26 – 2013-03-29 (×7): 1 mg via ORAL
  Filled 2013-03-26 (×7): qty 1

## 2013-03-26 MED ORDER — HYDROCODONE-ACETAMINOPHEN 5-325 MG PO TABS
1.0000 | ORAL_TABLET | ORAL | Status: DC | PRN
  Filled 2013-03-26: qty 1

## 2013-03-26 MED ORDER — FUROSEMIDE 10 MG/ML IJ SOLN: 20.0000 mg | Freq: Two times a day (BID) | INTRAMUSCULAR | Status: DC

## 2013-03-26 MED ORDER — MECLIZINE HCL 12.5 MG PO TABS: 25.0000 mg | ORAL_TABLET | Freq: Every day | ORAL | Status: DC | PRN

## 2013-03-26 MED ORDER — IPRATROPIUM BROMIDE 0.02 % IN SOLN
0.5000 mg | Freq: Once | RESPIRATORY_TRACT | Status: AC
  Administered 2013-03-26: 0.5 mg via RESPIRATORY_TRACT
  Filled 2013-03-26: qty 2.5

## 2013-03-26 MED ORDER — IPRATROPIUM BROMIDE 0.02 % IN SOLN
0.5000 mg | RESPIRATORY_TRACT | Status: DC
  Administered 2013-03-26 – 2013-03-27 (×8): 0.5 mg via RESPIRATORY_TRACT
  Filled 2013-03-26 (×7): qty 2.5

## 2013-03-26 MED ORDER — LEVOFLOXACIN IN D5W 750 MG/150ML IV SOLN
750.0000 mg | Freq: Once | INTRAVENOUS | Status: AC
  Administered 2013-03-26: 750 mg via INTRAVENOUS
  Filled 2013-03-26: qty 150

## 2013-03-26 MED ORDER — ASPIRIN 81 MG PO CHEW
324.0000 mg | CHEWABLE_TABLET | Freq: Once | ORAL | Status: AC
  Administered 2013-03-26: 324 mg via ORAL
  Filled 2013-03-26: qty 4

## 2013-03-26 MED ORDER — HYDROMORPHONE HCL PF 1 MG/ML IJ SOLN: 0.5000 mg | INTRAMUSCULAR | Status: DC | PRN

## 2013-03-26 MED ORDER — CARVEDILOL 3.125 MG PO TABS
6.2500 mg | ORAL_TABLET | Freq: Two times a day (BID) | ORAL | Status: DC
  Administered 2013-03-26 – 2013-03-29 (×7): 6.25 mg via ORAL
  Filled 2013-03-26 (×7): qty 2

## 2013-03-26 MED ORDER — FUROSEMIDE 10 MG/ML IJ SOLN
40.0000 mg | Freq: Once | INTRAMUSCULAR | Status: AC
  Administered 2013-03-26: 40 mg via INTRAVENOUS
  Filled 2013-03-26: qty 4

## 2013-03-26 MED ORDER — ALBUTEROL SULFATE (5 MG/ML) 0.5% IN NEBU
2.5000 mg | INHALATION_SOLUTION | RESPIRATORY_TRACT | Status: DC
  Administered 2013-03-26 – 2013-03-27 (×8): 2.5 mg via RESPIRATORY_TRACT
  Filled 2013-03-26 (×7): qty 0.5

## 2013-03-26 MED ORDER — ALUM & MAG HYDROXIDE-SIMETH 200-200-20 MG/5ML PO SUSP: 30.0000 mL | Freq: Four times a day (QID) | ORAL | Status: DC | PRN

## 2013-03-26 MED ORDER — SIMVASTATIN 20 MG PO TABS
20.0000 mg | ORAL_TABLET | Freq: Every day | ORAL | Status: DC
  Administered 2013-03-26 – 2013-03-28 (×3): 20 mg via ORAL
  Filled 2013-03-26 (×3): qty 1

## 2013-03-26 MED ORDER — SACCHAROMYCES BOULARDII 250 MG PO CAPS
250.0000 mg | ORAL_CAPSULE | Freq: Every day | ORAL | Status: DC
  Administered 2013-03-26 – 2013-03-29 (×4): 250 mg via ORAL
  Filled 2013-03-26 (×4): qty 1

## 2013-03-26 MED ORDER — PANTOPRAZOLE SODIUM 40 MG PO TBEC
40.0000 mg | DELAYED_RELEASE_TABLET | Freq: Every day | ORAL | Status: DC
  Administered 2013-03-26 – 2013-03-29 (×4): 40 mg via ORAL
  Filled 2013-03-26 (×5): qty 1

## 2013-03-26 MED ORDER — FUROSEMIDE 10 MG/ML IJ SOLN
40.0000 mg | Freq: Two times a day (BID) | INTRAMUSCULAR | Status: DC
  Administered 2013-03-26 – 2013-03-28 (×4): 40 mg via INTRAVENOUS
  Filled 2013-03-26 (×4): qty 4

## 2013-03-26 MED ORDER — ADULT MULTIVITAMIN W/MINERALS CH
1.0000 | ORAL_TABLET | Freq: Every day | ORAL | Status: DC
  Administered 2013-03-26 – 2013-03-29 (×4): 1 via ORAL
  Filled 2013-03-26 (×4): qty 1

## 2013-03-26 MED ORDER — ENOXAPARIN SODIUM 40 MG/0.4ML ~~LOC~~ SOLN
40.0000 mg | SUBCUTANEOUS | Status: DC
  Administered 2013-03-26 – 2013-03-28 (×3): 40 mg via SUBCUTANEOUS
  Filled 2013-03-26 (×3): qty 0.4

## 2013-03-26 MED ORDER — SODIUM CHLORIDE 0.9 % IJ SOLN: 3.0000 mL | INTRAMUSCULAR | Status: DC | PRN

## 2013-03-26 MED ORDER — PNEUMOCOCCAL VAC POLYVALENT 25 MCG/0.5ML IJ INJ
0.5000 mL | INJECTION | INTRAMUSCULAR | Status: AC
  Administered 2013-03-27: 0.5 mL via INTRAMUSCULAR
  Filled 2013-03-26: qty 0.5

## 2013-03-26 MED ORDER — SODIUM CHLORIDE 0.9 % IJ SOLN
3.0000 mL | Freq: Two times a day (BID) | INTRAMUSCULAR | Status: DC
  Administered 2013-03-26 – 2013-03-27 (×2): 3 mL via INTRAVENOUS

## 2013-03-26 MED ORDER — TIOTROPIUM BROMIDE MONOHYDRATE 18 MCG IN CAPS
18.0000 ug | ORAL_CAPSULE | Freq: Every day | RESPIRATORY_TRACT | Status: DC
  Administered 2013-03-26 – 2013-03-29 (×3): 18 ug via RESPIRATORY_TRACT
  Filled 2013-03-26: qty 5

## 2013-03-26 MED ORDER — ACETAMINOPHEN 325 MG PO TABS
650.0000 mg | ORAL_TABLET | Freq: Four times a day (QID) | ORAL | Status: DC | PRN
  Administered 2013-03-26: 650 mg via ORAL
  Filled 2013-03-26: qty 2

## 2013-03-26 MED ORDER — FLUTICASONE PROPIONATE 50 MCG/ACT NA SUSP
2.0000 | Freq: Every day | NASAL | Status: DC
  Administered 2013-03-26 – 2013-03-29 (×4): 2 via NASAL
  Filled 2013-03-26: qty 16

## 2013-03-26 MED ORDER — SIMETHICONE 80 MG PO CHEW: 80.0000 mg | CHEWABLE_TABLET | Freq: Four times a day (QID) | ORAL | Status: DC | PRN

## 2013-03-26 MED ORDER — ONDANSETRON HCL 4 MG/2ML IJ SOLN: 4.0000 mg | Freq: Four times a day (QID) | INTRAMUSCULAR | Status: DC | PRN

## 2013-03-26 MED ORDER — LEVOFLOXACIN IN D5W 750 MG/150ML IV SOLN
750.0000 mg | INTRAVENOUS | Status: DC
  Administered 2013-03-27: 750 mg via INTRAVENOUS
  Filled 2013-03-26 (×2): qty 150

## 2013-03-26 MED ORDER — NAPROXEN 250 MG PO TABS: 500.0000 mg | ORAL_TABLET | Freq: Two times a day (BID) | ORAL | Status: DC

## 2013-03-26 MED ORDER — METHYLPREDNISOLONE SODIUM SUCC 125 MG IJ SOLR
80.0000 mg | Freq: Once | INTRAMUSCULAR | Status: DC
  Filled 2013-03-26: qty 2

## 2013-03-26 MED ORDER — POLYVINYL ALCOHOL 1.4 % OP SOLN: 1.0000 [drp] | OPHTHALMIC | Status: DC | PRN

## 2013-03-26 MED ORDER — POTASSIUM CHLORIDE CRYS ER 20 MEQ PO TBCR
20.0000 meq | EXTENDED_RELEASE_TABLET | Freq: Two times a day (BID) | ORAL | Status: DC
  Administered 2013-03-26 – 2013-03-29 (×7): 20 meq via ORAL
  Filled 2013-03-26 (×7): qty 1

## 2013-03-26 MED ORDER — LATANOPROST 0.005 % OP SOLN
1.0000 [drp] | Freq: Every day | OPHTHALMIC | Status: DC
  Administered 2013-03-27 – 2013-03-28 (×2): 1 [drp] via OPHTHALMIC
  Filled 2013-03-26: qty 2.5

## 2013-03-26 MED ORDER — LORATADINE 10 MG PO TABS
10.0000 mg | ORAL_TABLET | Freq: Every day | ORAL | Status: DC
  Administered 2013-03-26 – 2013-03-29 (×4): 10 mg via ORAL
  Filled 2013-03-26 (×4): qty 1

## 2013-03-26 MED ORDER — BACLOFEN 10 MG PO TABS
10.0000 mg | ORAL_TABLET | Freq: Two times a day (BID) | ORAL | Status: DC
Start: 2013-03-26 — End: 2013-03-29
  Administered 2013-03-26 – 2013-03-29 (×7): 10 mg via ORAL
  Filled 2013-03-26 (×7): qty 1

## 2013-03-26 MED ORDER — ACETAMINOPHEN 650 MG RE SUPP: 650.0000 mg | Freq: Four times a day (QID) | RECTAL | Status: DC | PRN

## 2013-03-26 MED ORDER — SODIUM CHLORIDE 0.9 % IV SOLN
250.0000 mL | INTRAVENOUS | Status: DC | PRN
  Administered 2013-03-27: 250 mL via INTRAVENOUS

## 2013-03-26 MED ORDER — ALBUTEROL SULFATE (5 MG/ML) 0.5% IN NEBU
5.0000 mg | INHALATION_SOLUTION | Freq: Once | RESPIRATORY_TRACT | Status: AC
  Administered 2013-03-26: 5 mg via RESPIRATORY_TRACT
  Filled 2013-03-26: qty 1

## 2013-03-26 MED ORDER — POLYETHYLENE GLYCOL 3350 17 G PO PACK
17.0000 g | PACK | Freq: Every day | ORAL | Status: DC
  Administered 2013-03-28: 17 g via ORAL
  Filled 2013-03-26 (×4): qty 1

## 2013-03-26 NOTE — H&P (Signed)
[facility] History and Physical  Karen Collier ZOX:[id number] DOB: January 18, 1946 DOA: 03/26/2013  Referring physician:  PCP: Catalina Pizza, MD  Specialists:   Chief Complaint: dyspnea  HPI: Karen Collier is a very pleasant  67 y.o. female and and with a history significant for muscular dystrophy, COPD, chronic respiratory failure with hypoxia and hypercapnia and reported history of congestive heart failure presents to the emergency department with the chief complaint of shortness of breath. Permission is obtained from the patient and her healthcare power of attorney who is at the bedside. Patient reports five-day history of gradually worsening shortness of breath. Associated symptoms include lower extremity edema and dry cough and weight gain. Patient admits to orthopnea but indicates that this is no worse than her baseline. Patient denies fever chills nausea vomiting diarrhea constipation. She denies chest pain palpitations headache syncope or near-syncope. She denies any dysuria hematuria frequency or urgency. She is currently a resident of assisted living and reports that she has been taking her Lasix. She denies an outpatient plan that includes entering of daily weights and/or increasing upon Lasix. She is on oxygen at 2 L at home and has had to increase to 3 L. She notes that activity makes the dyspnea worse. In the emergency department she is noted to be afebrile and hemodynamically stable. In saturation level ranges between 96 and 99% on 3 L nasal cannula, her chest x-ray reveals lungs hypoexpanded. Bibasilar airspace opacification raises concern for pneumonia, though mild pulmonary edema might have a  similar appearance. Cardiomegaly noted. Her lab data are significant for CO2 of 44, hemoglobin of 11.6, proBNP of 85.2, normal troponin. In the emergency room she was given Solu-Medrol, Levaquin, nebulizers and 40 mg of IV Lasix. At the time of my exam she indicates some improvement with her  breathing. Symptoms came on gradually have persisted characterized as moderate. We are asked to admit for further evaluation and treatment     Review of Systems: 10 point review of systems completed all systems are negative except as indicated in the history of present illness  Past Medical History  Diagnosis Date  . Muscular dystrophy   . COPD (chronic obstructive pulmonary disease)   . Asthma   . Hypoxemia   . CHF (congestive heart failure)   . Chronic pain   . Hypertension   . Glaucoma   . Muscle weakness   . Chronic respiratory failure with hypercapnia     And hypoxia  . Atelectasis 11/21/2011  . Lung nodule 11/21/2011  . Thyroid nodule 11/21/2011  . Compensated alkalosis 11/21/2011  . Pneumonia 11/21/2011  . Hyponatremia Recurrent    Normalizes with normal saline  . GERD (gastroesophageal reflux disease)    Past Surgical History  Procedure Laterality Date  . Cholecystectomy    . Abdominal hysterectomy    . Colonoscopy N/A 12/07/2012    [id number] lipoma; otherwise negative examination  . Esophagogastroduodenoscopy N/A 12/07/2012    FAO:ZHYQMVH polyps. Hiatal hernia. Status post gastric bx, chronic gastritis noted.    Social History:  reports that she has never smoked. She does not have any smokeless tobacco history on file. She reports that she does not drink alcohol or use illicit drugs. Patient lives in assisted living at [facility]. She is retired from [employer] work. He ambulates a little with a walker but mostly just transfers.  No Known Allergies  Family History  Problem Relation Age of Onset  . Colon cancer Neg Hx    mother died of  lung cancer. Her father died of muscular dystrophy and congestive heart failure. She had one sister who died of lung cancer. She has another sister who died of a stroke.  Prior to Admission medications   Medication Sig Start Date End Date Taking? Authorizing Provider  acetaminophen (TYLENOL) 500 MG tablet Take 500 mg by mouth every 6 (six)  hours as needed for mild pain.   Yes Historical Provider, MD  albuterol (PROVENTIL) (2.5 MG/3ML) 0.083% nebulizer solution Take 2.5 mg by nebulization 3 (three) times daily as needed for shortness of breath.   Yes Historical Provider, MD  baclofen (LIORESAL) 10 MG tablet Take 10 mg by mouth 2 (two) times daily.   Yes Historical Provider, MD  Calcium Carbonate-Vitamin D (CALCIUM 600 + D PO) Take 1 tablet by mouth daily.   Yes Historical Provider, MD  carvedilol (COREG) 6.25 MG tablet Take 1 tablet (6.25 mg total) by mouth 2 (two) times daily. 11/23/11  Yes Elliot Cousin, MD  docusate sodium (COLACE) 100 MG capsule Take 100 mg by mouth at bedtime.    Yes Historical Provider, MD  fexofenadine (ALLEGRA) 180 MG tablet Take 180 mg by mouth daily.   Yes Historical Provider, MD  fluticasone (FLONASE) 50 MCG/ACT nasal spray Place 2 sprays into both nostrils daily.   Yes Historical Provider, MD  furosemide (LASIX) 20 MG tablet Take 20 mg by mouth daily.   Yes Historical Provider, MD  latanoprost (XALATAN) 0.005 % ophthalmic solution Place 1 drop into both eyes at bedtime.   Yes Historical Provider, MD  liver oil-zinc oxide (DESITIN) 40 % ointment Apply 1 application topically as needed for irritation (apply to affected areas as needed for irritation).   Yes Historical Provider, MD  LORazepam (ATIVAN) 1 MG tablet Take 1 tablet (1 mg total) by mouth 2 (two) times daily. 11/23/11  Yes Elliot Cousin, MD  meclizine (ANTIVERT) 25 MG tablet Take 25 mg by mouth daily as needed for dizziness.   Yes Historical Provider, MD  Multiple Vitamin (DAILY VITE PO) Take 1 tablet by mouth daily.   Yes Historical Provider, MD  naproxen (NAPROSYN) 500 MG tablet Take 500 mg by mouth 2 (two) times daily with a meal.   Yes Historical Provider, MD  omeprazole (PRILOSEC) 20 MG capsule Take 1 capsule (20 mg total) by mouth daily. 11/27/12  Yes Nira Retort, NP  Polyethyl Glycol-Propyl Glycol 0.4-0.3 % SOLN Apply 1 drop to eye as needed  (instill 1-2 drops in each eye as needed for dry eyes).   Yes Historical Provider, MD  polyethylene glycol powder (GLYCOLAX/MIRALAX) powder Take 17 g by mouth daily. As needed for a bowel movement daily. 01/23/13  Yes Nira Retort, NP  potassium chloride SA (K-DUR,KLOR-CON) 20 MEQ tablet Take 20 mEq by mouth 2 (two) times daily.   Yes Historical Provider, MD  Probiotic Product (ALIGN) 4 MG CAPS Take 1 capsule by mouth daily.   Yes Historical Provider, MD  simethicone (MYLICON) 80 MG chewable tablet Chew 80 mg by mouth every 6 (six) hours as needed for flatulence.   Yes Historical Provider, MD  simvastatin (ZOCOR) 20 MG tablet Take 20 mg by mouth at bedtime.   Yes Historical Provider, MD  SPIRIVA HANDIHALER 18 MCG inhalation capsule Place 18 mcg into inhaler and inhale daily.  11/20/12  Yes Historical Provider, MD  TRIAMCINOLONE ACETONIDE,NASAL, NA Place 2 sprays into the nose daily.   Yes Historical Provider, MD  Multiple Vitamin (MULTIVITAMIN WITH MINERALS) TABS Take 1 tablet  by mouth daily.    Historical Provider, MD   Physical Exam: Filed Vitals:   03/26/13 0848  BP: 139/51  Pulse: 101  Temp: 98 F (36.7 C)  Resp: 20     General: Obese no acute distress  Eyes: Pupils equal round reactive to light. Extraocular movements are intact. No scleral icterus  ENT: Ears clear nose without drainage oropharynx without erythema or exudate. His membranes of her mouth are pink slightly dry  Neck: Supple no JVD no lymphadenopathy  Cardiovascular: Regular rate and rhythm. No gallop no 1+ lower extremity edema  Respiratory: Normal effort. Respirations are very distant particularly in the bases. Fine crackles auscultated bilaterally. I hear no wheeze no rhonchi  Abdomen: Obese soft positive bowel sounds nontender to   Skin: Warm and dry no rashes or lesions noted  Musculoskeletal: Joints without swelling/erythema no clubbing no cyanosis  Psychiatric: Calm cooperative  Neurologic: Alert and  oriented x3 speech clear facial symmetry  Labs on Admission:  Basic Metabolic Panel:  Recent Labs Lab 03/26/13 0534  NA 142  K 4.3  CL 97  CO2 44*  GLUCOSE 114*  BUN 11  CREATININE 0.31*  CALCIUM 9.4   Liver Function Tests: No results found for this basename: AST, ALT, ALKPHOS, BILITOT, PROT, ALBUMIN,  in the last 168 hours No results found for this basename: LIPASE, AMYLASE,  in the last 168 hours No results found for this basename: AMMONIA,  in the last 168 hours CBC:  Recent Labs Lab 03/26/13 0534  WBC 7.5  NEUTROABS 6.1  HGB 11.6*  HCT 38.2  MCV 96.2  PLT 120*   Cardiac Enzymes:  Recent Labs Lab 03/26/13 0534  TROPONINI <0.30    BNP (last 3 results)  Recent Labs  03/26/13 0534  PROBNP 85.2   CBG: No results found for this basename: GLUCAP,  in the last 168 hours  Radiological Exams on Admission: Dg Chest 2 View  03/26/2013   CLINICAL DATA:  Worsening shortness of breath for 5 days.  EXAM: CHEST  2 VIEW  COMPARISON:  Chest radiograph and CTA of the chest performed 11/20/2011  FINDINGS: The lungs are hypoexpanded. Bibasilar airspace opacification raises concern for pneumonia, though mild pulmonary edema might have a similar appearance. No definite pleural effusion or pneumothorax is seen.  The cardiomediastinal silhouette is enlarged. No acute osseous abnormalities are identified.  IMPRESSION: 1. Lungs hypoexpanded. Bibasilar airspace opacification raises concern for pneumonia, though mild pulmonary edema might have a similar appearance. 2. Cardiomegaly noted.   Electronically Signed   By: Roanna Raider M.D.   On: 03/26/2013 06:40    EKG: Independently reviewed. Normal sinus rhythm  Assessment/Plan Principal Problem:   Dyspnea: Likely multifactorial specifically pulmonary edema, community-acquired pneumonia in the setting of chronic hypoxic and hypercapnic respiratory failure. Will admit to telemetry. Will continue to diurese with IV Lasix. Will obtain  2-D echo as I see no recent documentation of her ejection fraction. Monitor daily weights and intake and output. Continue Levaquin. Monitor oxygen saturation level.  Active Problems:  CAP (community acquired pneumonia): See problem #1. Patient is currently afebrile and hemodynamically stable.  No leukocytosis. Will continue Levaquin per pharmacy. Will monitor    COPD (chronic obstructive pulmonary disease): Patient with home oxygen at 2 L and home nebulizers. Continue nebulizer treatments every 4 hours continue home inhaler. She did receive one dose of Solu-Medrol in the emergency department. Here no wheezing on exam will hold off on steroids for now. Will monitor closely  Chronic respiratory failure with hypercapnia: Her CO2 seems to be slightly above her baseline. She is requiring 3 L of oxygen to keep her oxygen saturation level 95-99%. See treatments in problem #1. Will wean oxygen back to her baseline.    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 Will continue home medication. We'll request PT consult     GERD (gastroesophageal reflux disease): Continue PPI    Anemia: Patient with history of normocytic anemia. Current hemoglobin 11.6 slightly below what appears to be her range. Chart review indicates patient with recent EGD/TCS that were unrevealing.    Code Status: full Family Communication: Healthcare power of attorney at bedside Disposition Plan: Back to assisted living when ready  Time spent: 65 minutes  Gwenyth Bender [facility] Pager [phone]  If 7PM-7AM, please contact night-coverage [email or web address] Password [email or web address] 03/26/2013, 9:04 AM

## 2013-03-26 NOTE — ED Notes (Signed)
Pt resting in bed with nad noted. Family at bsd. Pt up to bsd commode with assistance. Vss.

## 2013-03-26 NOTE — ED Notes (Signed)
Patient presents to ER via [facility] from [facility] with c/o increased shortness of breath x 5 days.  Patient also c/o neck pain and sore throat.

## 2013-03-26 NOTE — Progress Notes (Signed)
ANTIBIOTIC CONSULT NOTE - INITIAL  Pharmacy Consult for Levaquin Indication: site of infection: lungs  No Known Allergies  Patient Measurements: Height: 5\' 6"  (167.6 cm) Weight: 220 lb 3.8 oz (99.9 kg) IBW/kg (Calculated) : 59.3  Vital Signs: Temp: 98 F (36.7 C) (11/10 0848) Temp src: Oral (11/10 0848) BP: 139/51 mmHg (11/10 0848) Pulse Rate: 101 (11/10 0848) Intake/Output from previous day:   Intake/Output from this shift: Total I/O In: -  Out: 1300 [Urine:1300]  Labs:  Recent Labs  03/26/13 0534  WBC 7.5  HGB 11.6*  PLT 120*  CREATININE 0.31*   Estimated Creatinine Clearance: 79.1 ml/min (by C-G formula based on Cr of 0.31). No results found for this basename: VANCOTROUGH, VANCOPEAK, VANCORANDOM, GENTTROUGH, GENTPEAK, GENTRANDOM, TOBRATROUGH, TOBRAPEAK, TOBRARND, AMIKACINPEAK, AMIKACINTROU, AMIKACIN,  in the last 72 hours   Microbiology: No results found for this or any previous visit (from the past 720 hour(s)).  Medical History: Past Medical History  Diagnosis Date  . Muscular dystrophy   . COPD (chronic obstructive pulmonary disease)   . Asthma   . Hypoxemia   . CHF (congestive heart failure)   . Chronic pain   . Hypertension   . Glaucoma   . Muscle weakness   . Chronic respiratory failure with hypercapnia     And hypoxia  . Atelectasis 11/21/2011  . Lung nodule 11/21/2011  . Thyroid nodule 11/21/2011  . Compensated alkalosis 11/21/2011  . Pneumonia 11/21/2011  . Hyponatremia Recurrent    Normalizes with normal saline  . GERD (gastroesophageal reflux disease)     Medications:  Scheduled:  . albuterol  2.5 mg Nebulization Q4H  . baclofen  10 mg Oral BID  . carvedilol  6.25 mg Oral BID  . docusate sodium  100 mg Oral QHS  . enoxaparin (LOVENOX) injection  40 mg Subcutaneous Q24H  . fluticasone  2 spray Each Nare Daily  . furosemide  20 mg Intravenous Q12H  . ipratropium  0.5 mg Nebulization Q4H  . latanoprost  1 drop Both Eyes QHS  . [START ON  03/27/2013] levofloxacin (LEVAQUIN) IV  750 mg Intravenous Q24H  . loratadine  10 mg Oral Daily  . LORazepam  1 mg Oral BID  . multivitamin with minerals  1 tablet Oral Daily  . naproxen  500 mg Oral BID WC  . pantoprazole  40 mg Oral Daily  . polyethylene glycol  17 g Oral Daily  . potassium chloride SA  20 mEq Oral BID  . saccharomyces boulardii  250 mg Oral Daily  . simvastatin  20 mg Oral QHS  . sodium chloride  3 mL Intravenous Q12H  . sodium chloride  3 mL Intravenous Q12H  . tiotropium  18 mcg Inhalation Daily   Assessment: 67yo female admitted for worsening SOB.  Estimated Creatinine Clearance: 79.1 ml/min (by C-G formula based on Cr of 0.31).  Goal of Therapy:  Eradicate infection.  Plan:  Levaquin 750mg  IV q24hrs Switch to PO when appropriate Monitor labs, renal fxn, and cultures  Valrie Hart A 03/26/2013,9:28 AM

## 2013-03-26 NOTE — ED Provider Notes (Signed)
CSN: [id number]     Arrival date & time 03/26/13  9528 History   First MD Initiated Contact with Patient 03/26/13 0441     Chief Complaint  Patient presents with  . Shortness of Breath   (Consider location/radiation/quality/duration/timing/severity/associated sxs/prior Treatment) HPI Comments: 67 yo female with muscular dystrophy, COPD, CHF, anemia hx presents with gradually worsening sob for 5 days.  Worse with lying flat and exertion.  Similar to previous resp hx, pt feels it is more like her CHF.  Mild cough.  No fevers.  No recent surgeries or blood clot hx.  Bilateral leg swelling and wt gain.  Nothing improves.  Pt in assisted living.  Pt on lasix unknown dose. Pt on 2 L home O2 [place].  Patient is a 67 y.o. female presenting with shortness of breath. The history is provided by the patient.  Shortness of Breath Severity:  Moderate Associated symptoms: cough and neck pain   Associated symptoms: no abdominal pain, no chest pain, no fever, no headaches, no rash and no vomiting     Past Medical History  Diagnosis Date  . Muscular dystrophy   . COPD (chronic obstructive pulmonary disease)   . Asthma   . Hypoxemia   . CHF (congestive heart failure)   . Chronic pain   . Hypertension   . Glaucoma   . Muscle weakness   . Chronic respiratory failure with hypercapnia     And hypoxia  . Atelectasis 11/21/2011  . Lung nodule 11/21/2011  . Thyroid nodule 11/21/2011  . Compensated alkalosis 11/21/2011  . Pneumonia 11/21/2011  . Hyponatremia Recurrent    Normalizes with normal saline  . GERD (gastroesophageal reflux disease)    Past Surgical History  Procedure Laterality Date  . Cholecystectomy    . Abdominal hysterectomy    . Colonoscopy N/A 12/07/2012    [id number] lipoma; otherwise negative examination  . Esophagogastroduodenoscopy N/A 12/07/2012    VOZ:DGUYQIH polyps. Hiatal hernia. Status post gastric bx, chronic gastritis noted.    Family History  Problem Relation Age of Onset  .  Colon cancer Neg Hx    History  Substance Use Topics  . Smoking status: Never Smoker   . Smokeless tobacco: Not on file  . Alcohol Use: No   OB History   Grav Para Term Preterm Abortions TAB SAB Ect Mult Living                 Review of Systems  Constitutional: Positive for fatigue. Negative for fever and chills.  HENT: Negative for congestion.   Eyes: Negative for visual disturbance.  Respiratory: Positive for cough and shortness of breath.   Cardiovascular: Negative for chest pain.  Gastrointestinal: Negative for vomiting and abdominal pain.  Genitourinary: Negative for dysuria and flank pain.  Musculoskeletal: Positive for neck pain. Negative for neck stiffness.  Skin: Negative for rash.  Neurological: Negative for light-headedness and headaches.    Allergies  Review of patient's allergies indicates no known allergies.  Home Medications   Current Outpatient Rx  Name  Route  Sig  Dispense  Refill  . acetaminophen (TYLENOL) 500 MG tablet   Oral   Take 500 mg by mouth every 6 (six) hours as needed for mild pain.         Marland Kitchen albuterol (PROVENTIL) (2.5 MG/3ML) 0.083% nebulizer solution   Nebulization   Take 2.5 mg by nebulization 3 (three) times daily as needed for shortness of breath.         . baclofen (  LIORESAL) 10 MG tablet   Oral   Take 10 mg by mouth 2 (two) times daily.         . Calcium Carbonate-Vitamin D (CALCIUM 600 + D PO)   Oral   Take 1 tablet by mouth daily.         . carvedilol (COREG) 6.25 MG tablet   Oral   Take 1 tablet (6.25 mg total) by mouth 2 (two) times daily.   60 tablet   2   . docusate sodium (COLACE) 100 MG capsule   Oral   Take 100 mg by mouth at bedtime.          . fexofenadine (ALLEGRA) 180 MG tablet   Oral   Take 180 mg by mouth daily.         . fluticasone (FLONASE) 50 MCG/ACT nasal spray   Each Nare   Place 2 sprays into both nostrils daily.         . furosemide (LASIX) 20 MG tablet   Oral   Take 20 mg by  mouth daily.         Marland Kitchen latanoprost (XALATAN) 0.005 % ophthalmic solution   Both Eyes   Place 1 drop into both eyes at bedtime.         Marland Kitchen liver oil-zinc oxide (DESITIN) 40 % ointment   Topical   Apply 1 application topically as needed for irritation (apply to affected areas as needed for irritation).         . LORazepam (ATIVAN) 1 MG tablet   Oral   Take 1 tablet (1 mg total) by mouth 2 (two) times daily.         . meclizine (ANTIVERT) 25 MG tablet   Oral   Take 25 mg by mouth daily as needed for dizziness.         . Multiple Vitamin (DAILY VITE PO)   Oral   Take 1 tablet by mouth daily.         . naproxen (NAPROSYN) 500 MG tablet   Oral   Take 500 mg by mouth 2 (two) times daily with a meal.         . omeprazole (PRILOSEC) 20 MG capsule   Oral   Take 1 capsule (20 mg total) by mouth daily.   30 capsule   3   . Polyethyl Glycol-Propyl Glycol 0.4-0.3 % SOLN   Ophthalmic   Apply 1 drop to eye as needed (instill 1-2 drops in each eye as needed for dry eyes).         . polyethylene glycol powder (GLYCOLAX/MIRALAX) powder   Oral   Take 17 g by mouth daily. As needed for a bowel movement daily.   255 g   11   . potassium chloride SA (K-DUR,KLOR-CON) 20 MEQ tablet   Oral   Take 20 mEq by mouth 2 (two) times daily.         . Probiotic Product (ALIGN) 4 MG CAPS   Oral   Take 1 capsule by mouth daily.         . simethicone (MYLICON) 80 MG chewable tablet   Oral   Chew 80 mg by mouth every 6 (six) hours as needed for flatulence.         . simvastatin (ZOCOR) 20 MG tablet   Oral   Take 20 mg by mouth at bedtime.         Marland Kitchen SPIRIVA HANDIHALER 18 MCG inhalation capsule   Inhalation  Place 18 mcg into inhaler and inhale daily.          . TRIAMCINOLONE ACETONIDE,NASAL, NA   Nasal   Place 2 sprays into the nose daily.         . Multiple Vitamin (MULTIVITAMIN WITH MINERALS) TABS   Oral   Take 1 tablet by mouth daily.          BP 148/61   Pulse 101  Temp(Src) 98.4 F (36.9 C) (Oral)  Resp 20  Ht 5\' 6"  (1.676 m)  Wt 208 lb (94.348 kg)  BMI 33.59 kg/m2  SpO2 96% Physical Exam  Nursing note and vitals reviewed. Constitutional: She is oriented to person, place, and time. She appears well-developed and well-nourished. No distress.  HENT:  Head: Normocephalic and atraumatic.  Eyes: Conjunctivae are normal. Right eye exhibits no discharge. Left eye exhibits no discharge.  Neck: Normal range of motion. Neck supple. No JVD present. No tracheal deviation present.  Cardiovascular: Normal rate and regular rhythm.   Murmur (2+ SM upper sternum) heard. Pulmonary/Chest: No respiratory distress. She has wheezes (mild exp). She has rales (crackles bilateral lower and mid).  Abdominal: Soft. She exhibits no distension. There is no tenderness. There is no guarding.  Musculoskeletal: She exhibits edema (2+ bilateral LE). She exhibits no tenderness.  Neurological: She is alert and oriented to person, place, and time.  Skin: Skin is warm. No rash noted.  Psychiatric: She has a normal mood and affect.    ED Course  Procedures (including critical care time) Labs Review Labs Reviewed  CBC WITH DIFFERENTIAL - Abnormal; Notable for the following:    Hemoglobin 11.6 (*)    Platelets 120 (*)    Neutrophils Relative % 81 (*)    All other components within normal limits  BASIC METABOLIC PANEL - Abnormal; Notable for the following:    CO2 44 (*)    Glucose, Bld 114 (*)    Creatinine, Ser 0.31 (*)    All other components within normal limits  PRO B NATRIURETIC PEPTIDE  TROPONIN I   Imaging Review Dg Chest 2 View  03/26/2013   CLINICAL DATA:  Worsening shortness of breath for 5 days.  EXAM: CHEST  2 VIEW  COMPARISON:  Chest radiograph and CTA of the chest performed 11/20/2011  FINDINGS: The lungs are hypoexpanded. Bibasilar airspace opacification raises concern for pneumonia, though mild pulmonary edema might have a similar appearance.  No definite pleural effusion or pneumothorax is seen.  The cardiomediastinal silhouette is enlarged. No acute osseous abnormalities are identified.  IMPRESSION: 1. Lungs hypoexpanded. Bibasilar airspace opacification raises concern for pneumonia, though mild pulmonary edema might have a similar appearance. 2. Cardiomegaly noted.   Electronically Signed   By: Roanna Raider M.D.   On: 03/26/2013 06:40    EKG Interpretation     Ventricular Rate:  94 PR Interval:  160 QRS Duration: 78 QT Interval:  332 QTC Calculation: 415 R Axis:   -3 Text Interpretation:  Normal sinus rhythm Moderate voltage criteria for LVH, may be normal variant Borderline ECG When compared with ECG of 12-Nov-2011 20:04, Nonspecific T wave abnormality now evident in Anterior leads            MDM   1. Community acquired pneumonia   2. Dyspnea   3. Pulmonary edema    Clinically more CHF than COPD with weight gain, exertional, leg swelling/ rales.  Other diff CAD/ NSTEMI/ pneumonia. EKG no acute findings.   CXR shows mild congestion, pulm  edema vs pneumonia.  Pt has not had fevers or productive cough. Levaquin given for possible pneumonia. Lasix given. Pt requiring more O2 than baseline.  On 3 L in ED.    Nebs in ED and cardiac work up.  Plan for tele/ observation for diuresis and ensure improvement prior to sending back to assisted living. Pt okay with plan.  Paged hospitalist for observation. SPoke with Dr Jomarie Longs, accepted full admit tele.     The patients results and plan were reviewed and discussed.   Any x-rays performed were personally reviewed by myself.   Differential diagnosis were considered with the presenting HPI.  Diagnosis: Dyspnea, Pulm edema, Community Pneumonia  EKG: reviewed  Admission/ observation were discussed with the admitting physician, patient and/or family and they are comfortable with the plan.         Enid Skeens, MD 03/26/13 [phone]

## 2013-03-26 NOTE — H&P (Signed)
Pt seen and examined primarily suspect CHF and not Pneumonia Diurese, Monitor urine output, creatinine and weights  Zannie Cove, MD [phone]

## 2013-03-26 NOTE — Clinical Social Work Psychosocial (Signed)
Clinical Social Work Department BRIEF PSYCHOSOCIAL ASSESSMENT 03/26/2013  Patient:  Karen Collier, Karen Collier     Account Number:  [id number]     Admit date:  03/26/2013  Clinical Social Worker:  Nancie Neas  Date/Time:  03/26/2013 02:00 PM  Referred by:  CSW  Date Referred:  03/26/2013 Referred for  ALF Placement   Other Referral:   Interview type:  Patient Other interview type:    PSYCHOSOCIAL DATA Living Status:  FACILITY Admitted from facility:  [facility] Level of care:  Assisted Living Primary support name:  [name] Primary support relationship to patient:  FRIEND Degree of support available:   supportive per pt    CURRENT CONCERNS Current Concerns  Post-Acute Placement   Other Concerns:    SOCIAL WORK ASSESSMENT / PLAN CSW met with pt at bedside. Pt alert and oriented and reports she has been a resident at [facility] for about 3 years. Pt's family is out of town. Her best support is her POA/friend Administrator, [employer]. Pt indicates [name] sees her frequently. Pt admitted for pneumonia. She was short of breath at facility. Pt is on 2 liters chronic oxygen. Per Lupita Leash at facility, pt is a limited assist with most ADLs. She is able to feed herself. Pt ambulates using a walker for short distances and uses a wheelchair for longer distance. No home health prior to admission. Okay to return.   Assessment/plan status:  Psychosocial Support/Ongoing Assessment of Needs Other assessment/ plan:   Information/referral to community resources:   [facility]    PATIENT'S/FAMILY'S RESPONSE TO PLAN OF CARE: Pt reports very positive feelings regarding return to [facility] when medically stable. She considers [facility] her home now and has friends there. CSW will continue to follow.       Derenda Fennel, [place] [phone]

## 2013-03-26 NOTE — ED Notes (Signed)
CRITICAL VALUE ALERT  Critical value received: Co2  Date of notification:  03/26/13  Time of notification:  0610  Critical value read back: Yes  Nurse who received alert:  Tiana Loft, RN  MD notified: MD notified [phone]

## 2013-03-26 NOTE — Progress Notes (Signed)
*  PRELIMINARY RESULTS* Echocardiogram 2D Echocardiogram has been performed.  Mckensi Redinger 03/26/2013, 4:28 PM

## 2013-03-26 NOTE — Progress Notes (Signed)
UR Chart Review Completed  

## 2013-03-27 DIAGNOSIS — D649 Anemia, unspecified: Secondary | ICD-10-CM

## 2013-03-27 LAB — CBC
Hemoglobin: 12.1 g/dL (ref 12.0–15.0)
MCH: 29.3 pg (ref 26.0–34.0)
MCHC: 30.1 g/dL (ref 30.0–36.0)
MCV: 97.3 fL (ref 78.0–100.0)
Platelets: 117 10*3/uL — ABNORMAL LOW (ref 150–400)
RBC: 4.13 MIL/uL (ref 3.87–5.11)
RDW: 14.3 % (ref 11.5–15.5)

## 2013-03-27 LAB — MAGNESIUM: Magnesium: 2 mg/dL (ref 1.5–2.5)

## 2013-03-27 LAB — BASIC METABOLIC PANEL
CO2: 45 mEq/L (ref 19–32)
Calcium: 9.3 mg/dL (ref 8.4–10.5)
GFR calc Af Amer: >90 mL/min (ref >90)
Glucose, Bld: 101 mg/dL — ABNORMAL HIGH (ref 70–99)
Sodium: 144 mEq/L (ref 135–145)

## 2013-03-27 MED ORDER — LEVOFLOXACIN 750 MG PO TABS: 750.0000 mg | ORAL_TABLET | Freq: Every day | ORAL | Status: DC

## 2013-03-27 MED ORDER — IPRATROPIUM BROMIDE 0.02 % IN SOLN
RESPIRATORY_TRACT | Status: AC
  Filled 2013-03-27: qty 2.5

## 2013-03-27 NOTE — Care Management Note (Addendum)
    Page 1 of 1   03/29/2013     2:00:12 PM   CARE MANAGEMENT NOTE 03/29/2013  Patient:  Karen Collier, Karen Collier   Account Number:  [id number]  Date Initiated:  03/27/2013  Documentation initiated by:  Rosemary Holms  Subjective/Objective Assessment:   Pt admitted from [facility] ALF. Plans to return to facility when stable. CSW assisting with transition back to facility.     Action/Plan:   Anticipated DC Date:  03/29/2013   Anticipated DC Plan:  ASSISTED LIVING / REST HOME  In-house referral  Clinical Social Worker      DC Planning Services  CM consult      Choice offered to / List presented to:             Status of service:  Completed, signed off Medicare Important Message given?  YES (If response is "NO", the following Medicare IM given date fields will be blank) Date Medicare IM given:  03/29/2013 Date Additional Medicare IM given:    Discharge Disposition:  ASSISTED LIVING  Per UR Regulation:    If discussed at Long Length of Stay Meetings, dates discussed:    Comments:  03/27/13 Rosemary Holms RN BSN CM

## 2013-03-27 NOTE — Progress Notes (Signed)
Pt seen and examined,  Continue IV lasix, FU ECHO DC IV levaquin Back to ALF in 48hours if stable  Zannie Cove, MD [phone]

## 2013-03-27 NOTE — Progress Notes (Signed)
[facility] PROGRESS NOTE  MERLEAN PIZZINI MVH:[id number] DOB: 26-May-1945 DOA: 03/26/2013 PCP: Catalina Pizza, MD  Assessment/Plan: Dyspnea: Likely multifactorial specifically pulmonary edema, community-acquired pneumonia in the setting of chronic hypoxic and hypercapnic respiratory failure. Some improvement today. Will continue to diurese with IV Lasix. 2-D echo results EF 60% with moderate LVH. Volume status - . Weight 100.7kg from 99.9kg on admission.  Monitor daily weights and intake and output. Continue Levaquin day #2. Sats 100% on 2L.    Active Problems:  CAP (community acquired pneumonia): See problem #1. Patient remains afebrile and hemodynamically stable. No leukocytosis. Will continue Levaquin per pharmacy ay #2.   COPD (chronic obstructive pulmonary disease): Patient with home oxygen at 2 L and home nebulizers. Continue nebulizer treatments every 4 hours continue home inhaler.    Chronic respiratory failure with hypercapnia: Her CO2 seems to be slightly above her baseline. Sats 98-100% on 2L,  See treatments in problem #1.    Chronic diastolic HF: see #1. Continue lasix, intake and output and daily weights. Continue coreg.    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 Will continue home medication. We'll request PT consult   GERD (gastroesophageal reflux disease): Continue PPI   Anemia: Patient with history of normocytic anemia. Current hemoglobin 12.0. Chart review indicates patient with recent EGD/TCS that were unrevealing.    Code Status: full Family Communication: none present Disposition Plan: home when ready   Consultants:  none  Procedures:  none  Antibiotics:  levaquin 03/26/13>>  HPI/Subjective: Sitting up in bed reporting "feeling better"  Objective: Filed Vitals:   03/27/13 0532  BP: 112/63  Pulse: 95  Temp: 98.2 F (36.8 C)  Resp: 18    Intake/Output Summary (Last 24 hours) at 03/27/13 1216 Last data filed at 03/27/13 0914  Gross  per 24 hour  Intake   2600 ml  Output   3475 ml  Net   -875 ml   Filed Weights   03/26/13 0435 03/26/13 0848 03/27/13 0532  Weight: 94.348 kg (208 lb) 99.9 kg (220 lb 3.8 oz) 100.7 kg (222 lb 0.1 oz)    Exam:   General:  Obese NAD  Cardiovascular: RRR No gallup or rub. 1+ LE edema PPP  Respiratory: normal effort. BS with fine crackles bases no wheezse  Abdomen: obese soft +BS non-tender to palpation  Musculoskeletal: no clubbing no cyanosis   Data Reviewed: Basic Metabolic Panel:  Recent Labs Lab 03/26/13 0534 03/27/13 0606  NA 142 144  K 4.3 3.7  CL 97 98  CO2 44* 45*  GLUCOSE 114* 101*  BUN 11 11  CREATININE 0.31* 0.34*  CALCIUM 9.4 9.3   Liver Function Tests: No results found for this basename: AST, ALT, ALKPHOS, BILITOT, PROT, ALBUMIN,  in the last 168 hours No results found for this basename: LIPASE, AMYLASE,  in the last 168 hours No results found for this basename: AMMONIA,  in the last 168 hours CBC:  Recent Labs Lab 03/26/13 0534 03/27/13 0606  WBC 7.5 7.7  NEUTROABS 6.1  --   HGB 11.6* 12.1  HCT 38.2 40.2  MCV 96.2 97.3  PLT 120* 117*   Cardiac Enzymes:  Recent Labs Lab 03/26/13 0534 03/26/13 1438  TROPONINI <0.30 <0.30   BNP (last 3 results)  Recent Labs  03/26/13 0534  PROBNP 85.2   CBG: No results found for this basename: GLUCAP,  in the last 168 hours  No results found for this or any previous visit (from the past 240 hour(s)).  Studies: Dg Chest 2 View  03/26/2013   CLINICAL DATA:  Worsening shortness of breath for 5 days.  EXAM: CHEST  2 VIEW  COMPARISON:  Chest radiograph and CTA of the chest performed 11/20/2011  FINDINGS: The lungs are hypoexpanded. Bibasilar airspace opacification raises concern for pneumonia, though mild pulmonary edema might have a similar appearance. No definite pleural effusion or pneumothorax is seen.  The cardiomediastinal silhouette is enlarged. No acute osseous abnormalities are identified.   IMPRESSION: 1. Lungs hypoexpanded. Bibasilar airspace opacification raises concern for pneumonia, though mild pulmonary edema might have a similar appearance. 2. Cardiomegaly noted.   Electronically Signed   By: Roanna Raider M.D.   On: 03/26/2013 06:40    Scheduled Meds: . albuterol  2.5 mg Nebulization Q4H  . baclofen  10 mg Oral BID  . carvedilol  6.25 mg Oral BID  . docusate sodium  100 mg Oral QHS  . enoxaparin (LOVENOX) injection  40 mg Subcutaneous Q24H  . fluticasone  2 spray Each Nare Daily  . furosemide  40 mg Intravenous Q12H  . ipratropium  0.5 mg Nebulization Q4H  . latanoprost  1 drop Both Eyes QHS  . levofloxacin (LEVAQUIN) IV  750 mg Intravenous Q24H  . loratadine  10 mg Oral Daily  . LORazepam  1 mg Oral BID  . multivitamin with minerals  1 tablet Oral Daily  . pantoprazole  40 mg Oral Daily  . polyethylene glycol  17 g Oral Daily  . potassium chloride SA  20 mEq Oral BID  . saccharomyces boulardii  250 mg Oral Daily  . simvastatin  20 mg Oral QHS  . sodium chloride  3 mL Intravenous Q12H  . sodium chloride  3 mL Intravenous Q12H  . tiotropium  18 mcg Inhalation Daily   Continuous Infusions:   Principal Problem:   Dyspnea Active Problems:   COPD (chronic obstructive pulmonary disease) with acute bronchitis   Muscular dystrophy   Chronic respiratory failure with hypercapnia   GERD (gastroesophageal reflux disease)   Anemia   CAP (community acquired pneumonia)   CHF exacerbation    Time spent: 40 minutes    [facility] M  [facility] Pager [phone]. If 7PM-7AM, please contact night-coverage at [email or web address], password [facility] 03/27/2013, 12:16 PM  LOS: 1 day

## 2013-03-27 NOTE — Evaluation (Signed)
Physical Therapy Evaluation Patient Details Name: Karen Collier MRN: [id number] DOB: [birth year] Today's Date: 03/27/2013 Time: 0865-7846 PT Time Calculation (min): 29 min  PT Assessment / Plan / Recommendation History of Present Illness  Pt is admitted from [facility]  assisted living for tx of respiratory failure/CHF.  she has Muscular Dystrophy and is non ambulatory.  Her mobility is via W/C and she is able to transfer independently.  Pt is O2 dependent on 2 L O2 at home.  She reports feeling much better today.  Clinical Impression   Pt is very alert and cooperative with no c/o.  She is oxygenating well on 2 L O2 and was found to be at functional baseline.  Although it was reported that pt ambulates for short distances with a walker, pt states that she is totally non ambulatory.  She was able to transfer bed to chair with no assistance.    PT Assessment  Patent does not need any further PT services    Follow Up Recommendations  No PT follow up    Does the patient have the potential to tolerate intense rehabilitation      Barriers to Discharge        Equipment Recommendations  None recommended by PT    Recommendations for Other Services     Frequency      Precautions / Restrictions Precautions Precautions: Fall Restrictions Weight Bearing Restrictions: No   Pertinent Vitals/Pain       Mobility  Bed Mobility Bed Mobility: Supine to Sit Supine to Sit: 5: Supervision;HOB elevated Transfers Transfers: Stand Pivot Transfers Stand Pivot Transfers: 5: Supervision;With armrests Ambulation/Gait Ambulation/Gait Assistance: Not tested (comment)    Exercises     PT Diagnosis:    PT Problem List:   PT Treatment Interventions:       PT Goals(Current goals can be found in the care plan section) Acute Rehab PT Goals PT Goal Formulation: No goals set, d/c therapy  Visit Information  Last PT Received On: 03/27/13 History of Present Illness: Pt is admitted from [facility]  assisted living for tx of respiratory failure/CHF.  she has Muscular Dystrophy and is non ambulatory.  Her mobility is via W/C and she is able to transfer independently.  Pt is O2 dependent on 2 L O2 at home.  She reports feeling much better today.       Prior Functioning  Home Living Family/patient expects to be discharged to:: Assisted living Home Equipment: Hospital bed;Wheelchair - manual;Bedside commode Prior Function Level of Independence: Needs assistance Gait / Transfers Assistance Needed: independent transfer bed to chair, non ambulatory ADL's / Homemaking Assistance Needed: full assist with ADLs Communication Communication: No difficulties    Cognition  Cognition Arousal/Alertness: Awake/alert Behavior During Therapy: WFL for tasks assessed/performed Overall Cognitive Status: Within Functional Limits for tasks assessed    Extremity/Trunk Assessment Lower Extremity Assessment Lower Extremity Assessment: Generalized weakness   Balance Balance Balance Assessed: Yes Static Sitting Balance Static Sitting - Balance Support: No upper extremity supported;Feet supported Static Sitting - Level of Assistance: 6: Modified independent (Device/Increase time)  End of Session PT - End of Session Equipment Utilized During Treatment: Gait belt Activity Tolerance: Patient tolerated treatment well Patient left: in chair;with call bell/phone within reach Nurse Communication: Mobility status  GP     Konrad Penta 03/27/2013, 2:13 PM

## 2013-03-28 DIAGNOSIS — D696 Thrombocytopenia, unspecified: Secondary | ICD-10-CM | POA: Diagnosis present

## 2013-03-28 DIAGNOSIS — I5031 Acute diastolic (congestive) heart failure: Secondary | ICD-10-CM

## 2013-03-28 DIAGNOSIS — G7109 Other specified muscular dystrophies: Secondary | ICD-10-CM

## 2013-03-28 HISTORY — DX: Acute diastolic (congestive) heart failure: I50.31

## 2013-03-28 LAB — BASIC METABOLIC PANEL
BUN: 12 mg/dL (ref 6–23)
Calcium: 9.6 mg/dL (ref 8.4–10.5)
Chloride: 96 mEq/L (ref 96–112)
Creatinine, Ser: 0.3 mg/dL — ABNORMAL LOW (ref 0.50–1.10)
GFR calc Af Amer: >90 mL/min (ref >90)
GFR calc non Af Amer: >90 mL/min (ref >90)
Glucose, Bld: 98 mg/dL (ref 70–99)
Potassium: 4 mEq/L (ref 3.5–5.1)

## 2013-03-28 LAB — CBC
HCT: 41.3 % (ref 36.0–46.0)
Hemoglobin: 12.3 g/dL (ref 12.0–15.0)
MCH: 29.1 pg (ref 26.0–34.0)
MCHC: 29.8 g/dL — ABNORMAL LOW (ref 30.0–36.0)
Platelets: 124 10*3/uL — ABNORMAL LOW (ref 150–400)

## 2013-03-28 MED ORDER — FUROSEMIDE 10 MG/ML IJ SOLN
40.0000 mg | Freq: Every day | INTRAMUSCULAR | Status: DC
  Administered 2013-03-29: 40 mg via INTRAVENOUS
  Filled 2013-03-28: qty 4

## 2013-03-28 MED ORDER — ALBUTEROL SULFATE (5 MG/ML) 0.5% IN NEBU
2.5000 mg | INHALATION_SOLUTION | RESPIRATORY_TRACT | Status: DC
  Administered 2013-03-28 – 2013-03-29 (×6): 2.5 mg via RESPIRATORY_TRACT
  Filled 2013-03-28 (×7): qty 0.5

## 2013-03-28 MED ORDER — ALBUTEROL SULFATE (5 MG/ML) 0.5% IN NEBU: 2.5000 mg | INHALATION_SOLUTION | RESPIRATORY_TRACT | Status: DC | PRN

## 2013-03-28 NOTE — Progress Notes (Signed)
Patient with critical CO2 44. Dr. Sherrie Mustache notified.

## 2013-03-28 NOTE — Progress Notes (Signed)
Attending:  The patient was seen and examined. Her vital signs and laboratory studies were reviewed. She was discussed with nurse practitioner Ms. Black. Agree with her findings. The patient's 2-D echocardiogram reveals preserved left ventricular systolic function. Diastolic function was not measured, but is likely that she does have decompensated diastolic heart failure which is improving with gentle diuresis. The patient appears to be improving clinically and symptomatically. The patient is noted to be thrombocytopenic. Her platelet count was within normal limits in September 2014. She is currently on Lovenox. This will be held and we'll order TSH and vitamin B12 level for assessment. We'll order a followup CBC in the morning.

## 2013-03-28 NOTE — Progress Notes (Signed)
[facility] PROGRESS NOTE  Karen Collier ZOX:[id number] DOB: [birth year] DOA: 03/26/2013 PCP: Catalina Pizza, MD  Assessment/Plan: Dyspnea: Likely multifactorial specifically pulmonary edema, community-acquired pneumonia in the setting of chronic hypoxic and hypercapnic respiratory failure. Continues to improve. Volume status -3.5L. Will decrease IV Lasix dose with plan to transition to po tomorrow and/or at discharge. 2-D echo results EF 60% with moderate LVH.  Weight 98kg from 99.9kg on admission. Continue to monitor intake and output and daily weights. Will likely need increase in home lasix dose at discharge. Active Problems:  CAP (community acquired pneumonia): See problem #1. Patient remains afebrile and hemodynamically stable. No leukocytosis. Levaquin discontinued 03/27/13.   COPD (chronic obstructive pulmonary disease): Patient with home oxygen at 2 L and home nebulizers. Currently at baseline. Oxygen saturation level range 96%-99% on 2L.  Continue nebulizer treatments every 4 hours continue home inhaler.   Chronic respiratory failure with hypercapnia: appears stable.  Her CO2 seems to be slightly above her baseline. Oxygen saturation level as above.  See treatments in problem #1.   Chronic diastolic HF: see #1. Decrease IV lasix to q 12 hours today. Volume status -3.5L. Will continue home coreg. Will likely need home lasix dose increased. Have instructed pt to weigh daily from time of discharge to time of PCP follow up appointment so PCP can evaluation need for lasix dose adjustment.    Muscular dystrophy: Remains at baseline. Will continue home medication. PT consult recommends no PT needs at discharge  GERD (gastroesophageal reflux disease): Stable at baseline.  Continue PPI   Anemia: Patient with history of normocytic anemia.Stable at baseline. Chart review indicates patient with recent EGD/TCS that were unrevealing.   Code Status: full Family Communication: family at  bedside Disposition Plan: assisted living likely tomorrow   Consultants:  none  Procedures:  none  Antibiotics:  levaquin 03/26/13 - 03/27/13  HPI/Subjective: Sitting up in bed visiting with family. Denies pain/discomfort. Reports "breathing better"  Objective: Filed Vitals:   03/28/13 0500  BP: 143/56  Pulse: 88  Temp: 97.9 F (36.6 C)  Resp:     Intake/Output Summary (Last 24 hours) at 03/28/13 1048 Last data filed at 03/28/13 0500  Gross per 24 hour  Intake    490 ml  Output   2550 ml  Net  -2060 ml   Filed Weights   03/26/13 0848 03/27/13 0532 03/28/13 0500  Weight: 99.9 kg (220 lb 3.8 oz) 100.7 kg (222 lb 0.1 oz) 98 kg (216 lb 0.8 oz)    Exam:   General:  Obese and in NAD  Cardiovascular: S1 and S2. i hear no gallup or rub. There is trace LE edema  Respiratory: normal respiratory effort. BS with fine crackles left base, otherwise somewhat diminished throughout. i hear no wheezes  Abdomen: obese and soft with +BS throughout. Abdomen is non-tender to palpation  Musculoskeletal: there is no clubbing or cyanosis.    Data Reviewed: Basic Metabolic Panel:  Recent Labs Lab 03/26/13 0534 03/27/13 0606 03/27/13 1203 03/28/13 0551  NA 142 144  --  144  K 4.3 3.7  --  4.0  CL 97 98  --  96  CO2 44* 45*  --  44*  GLUCOSE 114* 101*  --  98  BUN 11 11  --  12  CREATININE 0.31* 0.34*  --  0.30*  CALCIUM 9.4 9.3  --  9.6  MG  --   --  2.0  --    Liver Function Tests: No results  found for this basename: AST, ALT, ALKPHOS, BILITOT, PROT, ALBUMIN,  in the last 168 hours No results found for this basename: LIPASE, AMYLASE,  in the last 168 hours No results found for this basename: AMMONIA,  in the last 168 hours CBC:  Recent Labs Lab 03/26/13 0534 03/27/13 0606 03/28/13 0551  WBC 7.5 7.7 8.4  NEUTROABS 6.1  --   --   HGB 11.6* 12.1 12.3  HCT 38.2 40.2 41.3  MCV 96.2 97.3 97.6  PLT 120* 117* 124*   Cardiac Enzymes:  Recent Labs Lab  03/26/13 0534 03/26/13 1438  TROPONINI <0.30 <0.30   BNP (last 3 results)  Recent Labs  03/26/13 0534  PROBNP 85.2   CBG: No results found for this basename: GLUCAP,  in the last 168 hours  No results found for this or any previous visit (from the past 240 hour(s)).   Studies: No results found.  Scheduled Meds: . albuterol  2.5 mg Nebulization Q4H WA  . baclofen  10 mg Oral BID  . carvedilol  6.25 mg Oral BID  . docusate sodium  100 mg Oral QHS  . enoxaparin (LOVENOX) injection  40 mg Subcutaneous Q24H  . fluticasone  2 spray Each Nare Daily  . [START ON 03/29/2013] furosemide  40 mg Intravenous Daily  . latanoprost  1 drop Both Eyes QHS  . loratadine  10 mg Oral Daily  . LORazepam  1 mg Oral BID  . multivitamin with minerals  1 tablet Oral Daily  . pantoprazole  40 mg Oral Daily  . polyethylene glycol  17 g Oral Daily  . potassium chloride SA  20 mEq Oral BID  . saccharomyces boulardii  250 mg Oral Daily  . simvastatin  20 mg Oral QHS  . sodium chloride  3 mL Intravenous Q12H  . sodium chloride  3 mL Intravenous Q12H  . tiotropium  18 mcg Inhalation Daily   Continuous Infusions:   Principal Problem:   Dyspnea Active Problems:   COPD (chronic obstructive pulmonary disease) with acute bronchitis   Muscular dystrophy   Chronic respiratory failure with hypercapnia   GERD (gastroesophageal reflux disease)   Anemia   CAP (community acquired pneumonia)   CHF exacerbation    Time spent: 35 minutes    [facility] M  [facility] Pager [phone]. If 7PM-7AM, please contact night-coverage at [email or web address], password [facility] 03/28/2013, 10:48 AM  LOS: 2 days

## 2013-03-29 ENCOUNTER — Encounter (HOSPITAL_COMMUNITY): Payer: Self-pay | Admitting: Internal Medicine

## 2013-03-29 DIAGNOSIS — I5031 Acute diastolic (congestive) heart failure: Principal | ICD-10-CM

## 2013-03-29 DIAGNOSIS — D696 Thrombocytopenia, unspecified: Secondary | ICD-10-CM

## 2013-03-29 LAB — BASIC METABOLIC PANEL
BUN: 12 mg/dL (ref 6–23)
Calcium: 9.4 mg/dL (ref 8.4–10.5)
Chloride: 94 mEq/L — ABNORMAL LOW (ref 96–112)
GFR calc non Af Amer: >90 mL/min (ref >90)
Glucose, Bld: 103 mg/dL — ABNORMAL HIGH (ref 70–99)

## 2013-03-29 LAB — CBC
HCT: 40.2 % (ref 36.0–46.0)
Hemoglobin: 12.1 g/dL (ref 12.0–15.0)
MCH: 29.2 pg (ref 26.0–34.0)
MCHC: 30.1 g/dL (ref 30.0–36.0)
MCV: 96.9 fL (ref 78.0–100.0)

## 2013-03-29 LAB — VITAMIN B12: Vitamin B-12: 669 pg/mL (ref 211–911)

## 2013-03-29 MED ORDER — FUROSEMIDE 20 MG PO TABS: 40.0000 mg | ORAL_TABLET | Freq: Two times a day (BID) | ORAL | Status: DC

## 2013-03-29 MED ORDER — FUROSEMIDE 20 MG PO TABS: 40.0000 mg | ORAL_TABLET | Freq: Two times a day (BID) | ORAL | Status: AC

## 2013-03-29 NOTE — Clinical Social Work Note (Signed)
Pt agreeable to return to [facility] today. Family to get Oxygen for pt. Pt states her POA, Olegario Messier will transport her to facility. Lupita Leash at facility notified of pt d/c and scripts sent to [facility].    Cameron Ali BSW Intern

## 2013-03-29 NOTE — Evaluation (Signed)
Physical Therapy Evaluation Patient Details Name: Karen Collier MRN: [id number] DOB: 09/29/1945 Today's Date: 03/29/2013 Time: 7846-9629 PT Time Calculation (min): 32 min  PT Assessment / Plan / Recommendation History of Present Illness  Pt is seen for reassessment today.  She apparently has stated that she is too weak to return to ACLF today as she had a near fall while transferring to [facility] with our staff.  Clinical Impression   Pt was seen for reassessment.  She described the difficulty she had with transfer earlier today, so we repeated the transfers.  She did not have any significant problem in my presence and was able to do several transfers with no physical intervention from me.  She does best when she is allowed to direct the staff as to the positioning of the equipment.  My guess is that this was not done and pt was therefore not moving as she normally does.  I will speak with the staff about this.  I did advise her that the sooner she is able to return to her normal environment and activities, the better she will be in terms of minimizing loss of strength.    PT Assessment  Patent does not need any further PT services    Follow Up Recommendations  No PT follow up    Does the patient have the potential to tolerate intense rehabilitation      Barriers to Discharge        Equipment Recommendations  None recommended by PT    Recommendations for Other Services     Frequency      Precautions / Restrictions Precautions Precautions: Fall Restrictions Weight Bearing Restrictions: No   Pertinent Vitals/Pain       Mobility  Bed Mobility Supine to Sit: 4: Min assist;HOB elevated Details for Bed Mobility Assistance: the mattress was firmed up but it still is so soft that it makes transfers somewhat difficult...pt does not like our linens and states that she normally has no trouble transferrring out of bed at [facility] Transfers Stand Pivot Transfers: 5: Supervision;With  armrests Details for Transfer Assistance: Pt did transfer bed to and from [facility], then transfer from bed to chair...she needed no physical intervention from me Ambulation/Gait Ambulation/Gait Assistance: Not tested (comment)    Exercises     PT Diagnosis:    PT Problem List:   PT Treatment Interventions:       PT Goals(Current goals can be found in the care plan section) Acute Rehab PT Goals PT Goal Formulation: No goals set, d/c therapy  Visit Information  Last PT Received On: 03/29/13 History of Present Illness: Pt is seen for reassessment today.  She apparently has stated that she is too weak to return to [facility] today as she had a near fall while transferring to [facility] with our staff.       Prior Functioning       Cognition  Cognition Arousal/Alertness: Awake/alert Behavior During Therapy: WFL for tasks assessed/performed Overall Cognitive Status: Within Functional Limits for tasks assessed    Extremity/Trunk Assessment Lower Extremity Assessment Lower Extremity Assessment: Generalized weakness   Balance    End of Session PT - End of Session Equipment Utilized During Treatment: Gait belt Activity Tolerance: Patient tolerated treatment well Patient left: in chair;with call bell/phone within reach;with family/visitor present  GP     Konrad Penta 03/29/2013, 12:28 PM

## 2013-03-29 NOTE — Progress Notes (Signed)
Patient with critical lab CO2 43. Toya Smothers, NP notified.

## 2013-03-29 NOTE — Progress Notes (Signed)
Physician Discharge Summary  Karen Collier:[id number] DOB: 01-02-1946 DOA: 03/26/2013  PCP: Karen Pizza, MD  Admit date: 03/26/2013 Discharge date: 03/29/2013  Time spent: 40 minutes  Recommendations for Outpatient Follow-up:  1. Follow up with PCP 1 week. Recommend evaluation of renal function and potassium level as well as volume status as lasix dose adjusted  Discharge Diagnoses:  Principal Problem:   Acute diastolic CHF (congestive heart failure) Active Problems:   COPD (chronic obstructive pulmonary disease) with acute bronchitis   Muscular dystrophy   Chronic respiratory failure with hypercapnia   GERD (gastroesophageal reflux disease)   Anemia   Dyspnea   CAP (community acquired pneumonia)   Thrombocytopenia, unspecified   Discharge Condition: stable  Diet recommendation: Heart Healthy  Filed Weights   03/26/13 0848 03/27/13 0532 03/28/13 0500  Weight: 99.9 kg (220 lb 3.8 oz) 100.7 kg (222 lb 0.1 oz) 98 kg (216 lb 0.8 oz)    History of present illness:   Karen Collier is a very pleasant 67 y.o. female and and with a history significant for muscular dystrophy, COPD, chronic respiratory failure with hypoxia and hypercapnia and reported history of congestive heart failure presented to the emergency department on 03/26/13 with the chief complaint of shortness of breath.  Patient reported five-day history of gradually worsening shortness of breath. Associated symptoms included lower extremity edema and dry cough and weight gain. Patient admited to orthopnea but indicated that this was no worse than her baseline. Patient denied fever chills nausea vomiting diarrhea constipation. She denied chest pain palpitations headache syncope or near-syncope. She denied any dysuria hematuria frequency or urgency. She is currently a resident of assisted living and reported that she had been taking her Lasix. She denied an outpatient plan that included entering of daily weights and/or  increasing Lasix. She is on oxygen at 2 L at home and has had to increase to 3 L. She noted that activity made the dyspnea worse. In the emergency department she was noted to be afebrile and hemodynamically stable. Oxygen saturation level ranged between 96 and 99% on 3 L nasal cannula, her chest x-ray revealed lungs hypoexpanded. Bibasilar airspace opacification raises concern for pneumonia, though mild pulmonary edema might have a  similar appearance. Cardiomegaly noted. Her lab data are significant for CO2 of 44, hemoglobin of 11.6, proBNP of 85.2, normal troponin. In the emergency room she was given Solu-Medrol, Levaquin, nebulizers and 40 mg of IV Lasix. At the time of my exam she indicates some improvement with her breathing.   Hospital Course:   Acute diastolic CHF: 2 d-echo yields preserved  left ventricular systolic functin. Diastolic function no measured but is likely she has decompensated diastolic heart failure. Admitted for gentle diuresis. Given IV lasix 40mg  q 12 hours. At discharge volume status -4.3L. Weight 98kg down from 100.7kg on admission. Will increase her home lasix dose to 40mg  BID on discharge. Will provide potassium supplement. Recommend evaluation of potassium level and volume status via daily weight data in 1 week. Pt has been instructed to document daily weights and take date to PCP at follow up.   COPD (chronic obstructive pulmonary disease): Patient with home oxygen at 2 L and home nebulizers. On admission required 3L to maintain sats >90%. At discharge back to baseline of 2L with oxygen saturation level 99%.  Currently at baseline.    CAP (community acquired pneumonia):  On admission chest xray concerning for pneumonia. Patient remained afebrile with normal white count. Levaquin discontinued 03/27/13.  Chronic respiratory failure with hypercapnia: appears stable. Her CO2 seems to be slightly above her baseline. Oxygen saturation level as above. See treatments in problem  #1.   Muscular dystrophy: Remained at baseline. Physical therapy evaluation and  recommends no PT needs at discharge   GERD (gastroesophageal reflux disease): Stable at baseline. Continue PPI   Anemia: Patient with history of normocytic anemia.Stable at baseline. Chart review indicates patient with recent EGD/TCS that were unrevealing.   Dyspnea: Likely multifactorial specifically pulmonary edema, community-acquired pneumonia in the setting of chronic hypoxic and hypercapnic respiratory failure. See above   Chronic diastolic HF: see #1.  Will continue home coreg.  Have instructed pt to weigh daily from time of discharge to time of PCP follow up appointment so PCP can evaluation need for lasix dose adjustment.   Thrombocytopenia: TSH and Vit B12 pending at discharge. Platelet level 134 at discharge. No obvious s/sx bleeding. Will need OP follow up with PCP   Procedures:  2 decho on 03/26/13. The cavity size was normal. Wall thickness was increased in a pattern of moderate LVH. Systolic function was normal. The estimated ejection fraction was in the range of 60% to 65%. The study is not technically sufficient to allow evaluation of LV diastolic function.   Consultations:  none  Discharge Exam: Filed Vitals:   03/29/13 0500  BP: 134/53  Pulse: 89  Temp: 97.7 F (36.5 C)  Resp: 18    General: well nourished comfortable NAD Cardiovascular: S1 and S2. No MGR. Trace LE edema Pedal pulses palpable bilaterally Respiratory: normal effort BS are somewhat diminished but clear bilaterally  Discharge Instructions  Discharge Orders   Future Orders Complete By Expires   Diet - low sodium heart healthy  As directed    Discharge instructions  As directed    Comments:     Follow up with PCP 1 week for evaluation of symptoms as well as renal function and potassium level.   Increase activity slowly  As directed        Medication List         acetaminophen 500 MG tablet  Commonly  known as:  TYLENOL  Take 500 mg by mouth every 6 (six) hours as needed for mild pain.     albuterol (2.5 MG/3ML) 0.083% nebulizer solution  Commonly known as:  PROVENTIL  Take 2.5 mg by nebulization 3 (three) times daily as needed for shortness of breath.     ALIGN 4 MG Caps  Take 1 capsule by mouth daily.     baclofen 10 MG tablet  Commonly known as:  LIORESAL  Take 10 mg by mouth 2 (two) times daily.     CALCIUM 600 + D PO  Take 1 tablet by mouth daily.     carvedilol 6.25 MG tablet  Commonly known as:  COREG  Take 1 tablet (6.25 mg total) by mouth 2 (two) times daily.     DAILY VITE PO  Take 1 tablet by mouth daily.     docusate sodium 100 MG capsule  Commonly known as:  COLACE  Take 100 mg by mouth at bedtime.     fexofenadine 180 MG tablet  Commonly known as:  ALLEGRA  Take 180 mg by mouth daily.     fluticasone 50 MCG/ACT nasal spray  Commonly known as:  FLONASE  Place 2 sprays into both nostrils daily.     furosemide 20 MG tablet  Commonly known as:  LASIX  Take 2 tablets (40 mg  total) by mouth 2 (two) times daily.     latanoprost 0.005 % ophthalmic solution  Commonly known as:  XALATAN  Place 1 drop into both eyes at bedtime.     liver oil-zinc oxide 40 % ointment  Commonly known as:  DESITIN  Apply 1 application topically as needed for irritation (apply to affected areas as needed for irritation).     LORazepam 1 MG tablet  Commonly known as:  ATIVAN  Take 1 tablet (1 mg total) by mouth 2 (two) times daily.     meclizine 25 MG tablet  Commonly known as:  ANTIVERT  Take 25 mg by mouth daily as needed for dizziness.     naproxen 500 MG tablet  Commonly known as:  NAPROSYN  Take 500 mg by mouth 2 (two) times daily with a meal.     omeprazole 20 MG capsule  Commonly known as:  PRILOSEC  Take 1 capsule (20 mg total) by mouth daily.     Polyethyl Glycol-Propyl Glycol 0.4-0.3 % Soln  Apply 1 drop to eye as needed (instill 1-2 drops in each eye as  needed for dry eyes).     polyethylene glycol powder powder  Commonly known as:  GLYCOLAX/MIRALAX  Take 17 g by mouth daily. As needed for a bowel movement daily.     potassium chloride SA 20 MEQ tablet  Commonly known as:  K-DUR,KLOR-CON  Take 20 mEq by mouth 2 (two) times daily.     simethicone 80 MG chewable tablet  Commonly known as:  MYLICON  Chew 80 mg by mouth every 6 (six) hours as needed for flatulence.     simvastatin 20 MG tablet  Commonly known as:  ZOCOR  Take 20 mg by mouth at bedtime.     SPIRIVA HANDIHALER 18 MCG inhalation capsule  Generic drug:  tiotropium  Place 18 mcg into inhaler and inhale daily.     TRIAMCINOLONE ACETONIDE(NASAL) NA  Place 2 sprays into the nose daily.       No Known Allergies     Follow-up Information   Follow up with Karen Pizza, MD. Schedule an appointment as soon as possible for a visit in 1 week. (will need evaluation of symptoms and evaluation of renal function, potassium level and weight for volume status )    Specialty:  Internal Medicine   Contact information:    [address]  [place] [address] [phone]        The results of significant diagnostics from this hospitalization (including imaging, microbiology, ancillary and laboratory) are listed below for reference.    Significant Diagnostic Studies: Dg Chest 2 View  03/26/2013   CLINICAL DATA:  Worsening shortness of breath for 5 days.  EXAM: CHEST  2 VIEW  COMPARISON:  Chest radiograph and CTA of the chest performed 11/20/2011  FINDINGS: The lungs are hypoexpanded. Bibasilar airspace opacification raises concern for pneumonia, though mild pulmonary edema might have a similar appearance. No definite pleural effusion or pneumothorax is seen.  The cardiomediastinal silhouette is enlarged. No acute osseous abnormalities are identified.  IMPRESSION: 1. Lungs hypoexpanded. Bibasilar airspace opacification raises concern for pneumonia, though mild pulmonary edema might  have a similar appearance. 2. Cardiomegaly noted.   Electronically Signed   By: Roanna Raider M.D.   On: 03/26/2013 06:40    Microbiology: No results found for this or any previous visit (from the past 240 hour(s)).   Labs: Basic Metabolic Panel:  Recent Labs Lab 03/26/13 0534 03/27/13  1610 03/27/13 1203 03/28/13 0551 03/29/13 0823  NA 142 144  --  144 140  K 4.3 3.7  --  4.0 4.1  CL 97 98  --  96 94*  CO2 44* 45*  --  44* 43*  GLUCOSE 114* 101*  --  98 103*  BUN 11 11  --  12 12  CREATININE 0.31* 0.34*  --  0.30* 0.27*  CALCIUM 9.4 9.3  --  9.6 9.4  MG  --   --  2.0  --   --    Liver Function Tests: No results found for this basename: AST, ALT, ALKPHOS, BILITOT, PROT, ALBUMIN,  in the last 168 hours No results found for this basename: LIPASE, AMYLASE,  in the last 168 hours No results found for this basename: AMMONIA,  in the last 168 hours CBC:  Recent Labs Lab 03/26/13 0534 03/27/13 0606 03/28/13 0551 03/29/13 0531  WBC 7.5 7.7 8.4 7.7  NEUTROABS 6.1  --   --   --   HGB 11.6* 12.1 12.3 12.1  HCT 38.2 40.2 41.3 40.2  MCV 96.2 97.3 97.6 96.9  PLT 120* 117* 124* 134*   Cardiac Enzymes:  Recent Labs Lab 03/26/13 0534 03/26/13 1438  TROPONINI <0.30 <0.30   BNP: BNP (last 3 results)  Recent Labs  03/26/13 0534  PROBNP 85.2   CBG: No results found for this basename: GLUCAP,  in the last 168 hours     Signed:  Gwenyth Bender  [facility] 03/29/2013, 1:02 PM

## 2013-03-29 NOTE — Progress Notes (Signed)
Attending:  The patient was seen and examined. Her vital signs and laboratory studies were reviewed. She was discussed with nurse practitioner, Ms. Vedia Coffer. Agree with findings, assessment, and plan. The patient's symptomatology appeared to be more consistent with acute diastolic heart failure than pneumonia. The 2-D echocardiogram did not measure diastolic function, but clinically, it appeared that she had decompensated diastolic heart failure. Antibiotics were started and then discontinued during the hospitalization. She diuresed well.  At followup, she will need her renal function and potassium level reassessed as she is being discharged on a higher dose of Lasix. Her TSH and vitamin B12 levels were pending at the time of discharge. These would ordered to assess thrombocytopenia. Her platelet count was beginning to increase toward normal prior to hospital discharge.

## 2013-03-29 NOTE — Progress Notes (Signed)
Patient with orders to be discharge to [facility]. Discharge packet sent with patient. Patient educated on importance of daily weights and low sodium diet. Patient stable. Patient left with family in private vehicle.

## 2013-04-02 NOTE — Discharge Summary (Signed)
Karen Collier:[id number] DOB: 25-Dec-1945 DOA: 03/26/2013  PCP: Catalina Pizza, MD  Admit date: 03/26/2013  Discharge date: 03/29/2013  Time spent: 40 minutes  Recommendations for Outpatient Follow-up:  1. Follow up with PCP 1 week. Recommend evaluation of renal function and potassium level as well as volume status as lasix dose adjusted  Discharge Diagnoses:  Principal Problem:  Acute diastolic CHF (congestive heart failure)  Active Problems:  COPD (chronic obstructive pulmonary disease) with acute bronchitis  Muscular dystrophy  Chronic respiratory failure with hypercapnia  GERD (gastroesophageal reflux disease)  Anemia  Dyspnea  CAP (community acquired pneumonia)  Thrombocytopenia, unspecified   Discharge Condition: stable  Diet recommendation: Heart Healthy  Filed Weights    03/26/13 0848  03/27/13 0532  03/28/13 0500   Weight:  99.9 kg (220 lb 3.8 oz)  100.7 kg (222 lb 0.1 oz)  98 kg (216 lb 0.8 oz)    History of present illness:  Karen Collier is a very pleasant 67 y.o. female and and with a history significant for muscular dystrophy, COPD, chronic respiratory failure with hypoxia and hypercapnia and reported history of congestive heart failure presented to the emergency department on 03/26/13 with the chief complaint of shortness of breath. Patient reported five-day history of gradually worsening shortness of breath. Associated symptoms included lower extremity edema and dry cough and weight gain. Patient admited to orthopnea but indicated that this was no worse than her baseline. Patient denied fever chills nausea vomiting diarrhea constipation. She denied chest pain palpitations headache syncope or near-syncope. She denied any dysuria hematuria frequency or urgency. She is currently a resident of assisted living and reported that she had been taking her Lasix. She denied an outpatient plan that included entering of daily weights and/or increasing Lasix. She is on oxygen at 2 L at  home and has had to increase to 3 L. She noted that activity made the dyspnea worse. In the emergency department she was noted to be afebrile and hemodynamically stable. Oxygen saturation level ranged between 96 and 99% on 3 L nasal cannula, her chest x-ray revealed lungs hypoexpanded. Bibasilar airspace opacification raises concern for pneumonia, though mild pulmonary edema might have a  similar appearance. Cardiomegaly noted. Her lab data are significant for CO2 of 44, hemoglobin of 11.6, proBNP of 85.2, normal troponin. In the emergency room she was given Solu-Medrol, Levaquin, nebulizers and 40 mg of IV Lasix. At the time of my exam she indicates some improvement with her breathing.  Hospital Course:  Acute diastolic CHF: 2 d-echo yields preserved left ventricular systolic functin. Diastolic function no measured but is likely she has decompensated diastolic heart failure. Admitted for gentle diuresis. Given IV lasix 40mg  q 12 hours. At discharge volume status -4.3L. Weight 98kg down from 100.7kg on admission. Will increase her home lasix dose to 40mg  BID on discharge. Will provide potassium supplement. Recommend evaluation of potassium level and volume status via daily weight data in 1 week. Pt has been instructed to document daily weights and take date to PCP at follow up.  COPD (chronic obstructive pulmonary disease): Patient with home oxygen at 2 L and home nebulizers. On admission required 3L to maintain sats >90%. At discharge back to baseline of 2L with oxygen saturation level 99%. Currently at baseline.  CAP (community acquired pneumonia): On admission chest xray concerning for pneumonia. Patient remained afebrile with normal white count. Levaquin discontinued 03/27/13.  Chronic respiratory failure with hypercapnia: appears stable. Her CO2 seems to be slightly above  her baseline. Oxygen saturation level as above. See treatments in problem #1.  Muscular dystrophy: Remained at baseline. Physical  therapy evaluation and recommends no PT needs at discharge  GERD (gastroesophageal reflux disease): Stable at baseline. Continue PPI  Anemia: Patient with history of normocytic anemia.Stable at baseline. Chart review indicates patient with recent EGD/TCS that were unrevealing.  Dyspnea: Likely multifactorial specifically pulmonary edema, community-acquired pneumonia in the setting of chronic hypoxic and hypercapnic respiratory failure. See above  Chronic diastolic HF: see #1. Will continue home coreg. Have instructed pt to weigh daily from time of discharge to time of PCP follow up appointment so PCP can evaluation need for lasix dose adjustment.  Thrombocytopenia: TSH and Vit B12 pending at discharge. Platelet level 134 at discharge. No obvious s/sx bleeding. Will need OP follow up with PCP  Procedures:  2 decho on 03/26/13. The cavity size was normal. Wall thickness was increased in a pattern of moderate LVH. Systolic function was normal. The estimated ejection fraction was in the range of 60% to 65%. The study is not technically sufficient to allow evaluation of LV diastolic function.  Consultations:  none Discharge Exam:  Filed Vitals:    03/29/13 0500   BP:  134/53   Pulse:  89   Temp:  97.7 F (36.5 C)   Resp:  18    General: well nourished comfortable NAD  Cardiovascular: S1 and S2. No MGR. Trace LE edema Pedal pulses palpable bilaterally  Respiratory: normal effort BS are somewhat diminished but clear bilaterally  Discharge Instructions  Discharge Orders    Future Orders  Complete By  Expires    Diet - low sodium heart healthy  As directed     Discharge instructions  As directed     Comments:    Follow up with PCP 1 week for evaluation of symptoms as well as renal function and potassium level.    Increase activity slowly  As directed         Medication List         acetaminophen 500 MG tablet    Commonly known as: TYLENOL    Take 500 mg by mouth every 6 (six) hours  as needed for mild pain.    albuterol (2.5 MG/3ML) 0.083% nebulizer solution    Commonly known as: PROVENTIL    Take 2.5 mg by nebulization 3 (three) times daily as needed for shortness of breath.    ALIGN 4 MG Caps    Take 1 capsule by mouth daily.    baclofen 10 MG tablet    Commonly known as: LIORESAL    Take 10 mg by mouth 2 (two) times daily.    CALCIUM 600 + D PO    Take 1 tablet by mouth daily.    carvedilol 6.25 MG tablet    Commonly known as: COREG    Take 1 tablet (6.25 mg total) by mouth 2 (two) times daily.    DAILY VITE PO    Take 1 tablet by mouth daily.    docusate sodium 100 MG capsule    Commonly known as: COLACE    Take 100 mg by mouth at bedtime.    fexofenadine 180 MG tablet    Commonly known as: ALLEGRA    Take 180 mg by mouth daily.    fluticasone 50 MCG/ACT nasal spray    Commonly known as: FLONASE    Place 2 sprays into both nostrils daily.    furosemide 20 MG tablet  Commonly known as: LASIX    Take 2 tablets (40 mg total) by mouth 2 (two) times daily.    latanoprost 0.005 % ophthalmic solution    Commonly known as: XALATAN    Place 1 drop into both eyes at bedtime.    liver oil-zinc oxide 40 % ointment    Commonly known as: DESITIN    Apply 1 application topically as needed for irritation (apply to affected areas as needed for irritation).    LORazepam 1 MG tablet    Commonly known as: ATIVAN    Take 1 tablet (1 mg total) by mouth 2 (two) times daily.    meclizine 25 MG tablet    Commonly known as: ANTIVERT    Take 25 mg by mouth daily as needed for dizziness.    naproxen 500 MG tablet    Commonly known as: NAPROSYN    Take 500 mg by mouth 2 (two) times daily with a meal.    omeprazole 20 MG capsule    Commonly known as: PRILOSEC    Take 1 capsule (20 mg total) by mouth daily.    Polyethyl Glycol-Propyl Glycol 0.4-0.3 % Soln    Apply 1 drop to eye as needed (instill 1-2 drops in each eye as needed for dry eyes).    polyethylene glycol powder  powder    Commonly known as: GLYCOLAX/MIRALAX    Take 17 g by mouth daily. As needed for a bowel movement daily.    potassium chloride SA 20 MEQ tablet    Commonly known as: K-DUR,KLOR-CON    Take 20 mEq by mouth 2 (two) times daily.    simethicone 80 MG chewable tablet    Commonly known as: MYLICON    Chew 80 mg by mouth every 6 (six) hours as needed for flatulence.    simvastatin 20 MG tablet    Commonly known as: ZOCOR    Take 20 mg by mouth at bedtime.    SPIRIVA HANDIHALER 18 MCG inhalation capsule    Generic drug: tiotropium    Place 18 mcg into inhaler and inhale daily.    TRIAMCINOLONE ACETONIDE(NASAL) NA    Place 2 sprays into the nose daily.      No Known Allergies      Follow-up Information    Follow up with Catalina Pizza, MD. Schedule an appointment as soon as possible for a visit in 1 week. (will need evaluation of symptoms and evaluation of renal function, potassium level and weight for volume status )    Specialty: Internal Medicine    Contact information:    [address]  [place] [address]  [phone]     Significant Diagnostic Studies:  Dg Chest 2 View  03/26/2013 CLINICAL DATA: Worsening shortness of breath for 5 days. EXAM: CHEST 2 VIEW COMPARISON: Chest radiograph and CTA of the chest performed 11/20/2011 FINDINGS: The lungs are hypoexpanded. Bibasilar airspace opacification raises concern for pneumonia, though mild pulmonary edema might have a similar appearance. No definite pleural effusion or pneumothorax is seen. The cardiomediastinal silhouette is enlarged. No acute osseous abnormalities are identified. IMPRESSION: 1. Lungs hypoexpanded. Bibasilar airspace opacification raises concern for pneumonia, though mild pulmonary edema might have a similar appearance. 2. Cardiomegaly noted. Electronically Signed By: Roanna Raider M.D. On: 03/26/2013 06:40  Microbiology:  No results found for this or any previous visit (from the past 240 hour(s)).  Labs:  Basic  Metabolic Panel:   Recent Labs  Lab  03/26/13 0534  03/27/13  1610  03/27/13 1203  03/28/13 0551  03/29/13 0823   NA  142  144  --  144  140   K  4.3  3.7  --  4.0  4.1   CL  97  98  --  96  94*   CO2  44*  45*  --  44*  43*   GLUCOSE  114*  101*  --  98  103*   BUN  11  11  --  12  12   CREATININE  0.31*  0.34*  --  0.30*  0.27*   CALCIUM  9.4  9.3  --  9.6  9.4   MG  --  --  2.0  --  --    Liver Function Tests:  No results found for this basename: AST, ALT, ALKPHOS, BILITOT, PROT, ALBUMIN, in the last 168 hours  No results found for this basename: LIPASE, AMYLASE, in the last 168 hours  No results found for this basename: AMMONIA, in the last 168 hours  CBC:   Recent Labs  Lab  03/26/13 0534  03/27/13 0606  03/28/13 0551  03/29/13 0531   WBC  7.5  7.7  8.4  7.7   NEUTROABS  6.1  --  --  --   HGB  11.6*  12.1  12.3  12.1   HCT  38.2  40.2  41.3  40.2   MCV  96.2  97.3  97.6  96.9   PLT  120*  117*  124*  134*    Cardiac Enzymes:   Recent Labs  Lab  03/26/13 0534  03/26/13 1438   TROPONINI  <0.30  <0.30    BNP:  BNP (last 3 results)   Recent Labs   03/26/13 0534   PROBNP  85.2    CBG:  No results found for this basename: GLUCAP, in the last 168 hours  Signed:

## 2013-04-03 NOTE — Discharge Summary (Signed)
  Attending:  The patient was seen and examined. Her vital signs and laboratory studies were reviewed. She was discussed with nurse practitioner, Ms. Vedia Coffer. Agree with findings, assessment, and plan. The patient's symptomatology appeared to be more consistent with acute diastolic heart failure than pneumonia. The 2-D echocardiogram did not measure diastolic function, but clinically, it appeared that she had decompensated diastolic heart failure. Antibiotics were started and then discontinued during the hospitalization. She diuresed well.  At followup, she will need her renal function and potassium level reassessed as she is being discharged on a higher dose of Lasix. Her TSH and vitamin B12 levels were pending at the time of discharge. These would ordered to assess thrombocytopenia. Her platelet count was beginning to increase toward normal prior to hospital discharge.

## 2013-05-30 ENCOUNTER — Emergency Department (HOSPITAL_COMMUNITY): Payer: Medicare Other

## 2013-05-30 ENCOUNTER — Encounter (HOSPITAL_COMMUNITY): Payer: Self-pay | Admitting: Emergency Medicine

## 2013-05-30 ENCOUNTER — Inpatient Hospital Stay (HOSPITAL_COMMUNITY)
Admission: EM | Admit: 2013-05-30 | Discharge: 2013-06-17 | DRG: 208 | Disposition: E | Payer: Medicare Other | Attending: Internal Medicine | Admitting: Internal Medicine

## 2013-05-30 DIAGNOSIS — E43 Unspecified severe protein-calorie malnutrition: Secondary | ICD-10-CM | POA: Diagnosis present

## 2013-05-30 DIAGNOSIS — H409 Unspecified glaucoma: Secondary | ICD-10-CM | POA: Diagnosis present

## 2013-05-30 DIAGNOSIS — I10 Essential (primary) hypertension: Secondary | ICD-10-CM | POA: Diagnosis present

## 2013-05-30 DIAGNOSIS — I5031 Acute diastolic (congestive) heart failure: Secondary | ICD-10-CM

## 2013-05-30 DIAGNOSIS — G8929 Other chronic pain: Secondary | ICD-10-CM | POA: Diagnosis present

## 2013-05-30 DIAGNOSIS — J984 Other disorders of lung: Secondary | ICD-10-CM

## 2013-05-30 DIAGNOSIS — K5900 Constipation, unspecified: Secondary | ICD-10-CM

## 2013-05-30 DIAGNOSIS — R911 Solitary pulmonary nodule: Secondary | ICD-10-CM

## 2013-05-30 DIAGNOSIS — Z6835 Body mass index (BMI) 35.0-35.9, adult: Secondary | ICD-10-CM

## 2013-05-30 DIAGNOSIS — J986 Disorders of diaphragm: Secondary | ICD-10-CM | POA: Diagnosis present

## 2013-05-30 DIAGNOSIS — Z66 Do not resuscitate: Secondary | ICD-10-CM | POA: Diagnosis present

## 2013-05-30 DIAGNOSIS — J449 Chronic obstructive pulmonary disease, unspecified: Secondary | ICD-10-CM | POA: Diagnosis present

## 2013-05-30 DIAGNOSIS — E873 Alkalosis: Secondary | ICD-10-CM

## 2013-05-30 DIAGNOSIS — D696 Thrombocytopenia, unspecified: Secondary | ICD-10-CM | POA: Diagnosis present

## 2013-05-30 DIAGNOSIS — Z8679 Personal history of other diseases of the circulatory system: Secondary | ICD-10-CM

## 2013-05-30 DIAGNOSIS — J189 Pneumonia, unspecified organism: Secondary | ICD-10-CM

## 2013-05-30 DIAGNOSIS — E871 Hypo-osmolality and hyponatremia: Secondary | ICD-10-CM | POA: Diagnosis present

## 2013-05-30 DIAGNOSIS — G7109 Other specified muscular dystrophies: Secondary | ICD-10-CM | POA: Diagnosis present

## 2013-05-30 DIAGNOSIS — I5032 Chronic diastolic (congestive) heart failure: Secondary | ICD-10-CM | POA: Diagnosis present

## 2013-05-30 DIAGNOSIS — R0600 Dyspnea, unspecified: Secondary | ICD-10-CM

## 2013-05-30 DIAGNOSIS — G4700 Insomnia, unspecified: Secondary | ICD-10-CM | POA: Diagnosis present

## 2013-05-30 DIAGNOSIS — E041 Nontoxic single thyroid nodule: Secondary | ICD-10-CM

## 2013-05-30 DIAGNOSIS — I509 Heart failure, unspecified: Secondary | ICD-10-CM | POA: Diagnosis present

## 2013-05-30 DIAGNOSIS — J209 Acute bronchitis, unspecified: Principal | ICD-10-CM | POA: Diagnosis present

## 2013-05-30 DIAGNOSIS — J9819 Other pulmonary collapse: Secondary | ICD-10-CM | POA: Diagnosis present

## 2013-05-30 DIAGNOSIS — Z993 Dependence on wheelchair: Secondary | ICD-10-CM

## 2013-05-30 DIAGNOSIS — J4489 Other specified chronic obstructive pulmonary disease: Secondary | ICD-10-CM | POA: Diagnosis present

## 2013-05-30 DIAGNOSIS — J9620 Acute and chronic respiratory failure, unspecified whether with hypoxia or hypercapnia: Secondary | ICD-10-CM | POA: Diagnosis present

## 2013-05-30 DIAGNOSIS — J9690 Respiratory failure, unspecified, unspecified whether with hypoxia or hypercapnia: Secondary | ICD-10-CM

## 2013-05-30 DIAGNOSIS — K219 Gastro-esophageal reflux disease without esophagitis: Secondary | ICD-10-CM | POA: Diagnosis present

## 2013-05-30 DIAGNOSIS — J440 Chronic obstructive pulmonary disease with acute lower respiratory infection: Principal | ICD-10-CM | POA: Diagnosis present

## 2013-05-30 DIAGNOSIS — J9612 Chronic respiratory failure with hypercapnia: Secondary | ICD-10-CM

## 2013-05-30 DIAGNOSIS — Z515 Encounter for palliative care: Secondary | ICD-10-CM

## 2013-05-30 DIAGNOSIS — D649 Anemia, unspecified: Secondary | ICD-10-CM

## 2013-05-30 DIAGNOSIS — E874 Mixed disorder of acid-base balance: Secondary | ICD-10-CM | POA: Diagnosis present

## 2013-05-30 DIAGNOSIS — E876 Hypokalemia: Secondary | ICD-10-CM | POA: Diagnosis present

## 2013-05-30 DIAGNOSIS — Y95 Nosocomial condition: Secondary | ICD-10-CM

## 2013-05-30 DIAGNOSIS — R4182 Altered mental status, unspecified: Secondary | ICD-10-CM | POA: Diagnosis present

## 2013-05-30 DIAGNOSIS — G7100 Muscular dystrophy, unspecified: Secondary | ICD-10-CM

## 2013-05-30 DIAGNOSIS — Z9981 Dependence on supplemental oxygen: Secondary | ICD-10-CM

## 2013-05-30 DIAGNOSIS — J9811 Atelectasis: Secondary | ICD-10-CM

## 2013-05-30 DIAGNOSIS — R0689 Other abnormalities of breathing: Secondary | ICD-10-CM

## 2013-05-30 LAB — CBC WITH DIFFERENTIAL/PLATELET
BASOS ABS: 0 10*3/uL (ref 0.0–0.1)
Basophils Relative: 1 % (ref 0–1)
Eosinophils Absolute: 0.2 10*3/uL (ref 0.0–0.7)
Eosinophils Relative: 2 % (ref 0–5)
HCT: 37.5 % (ref 36.0–46.0)
HEMOGLOBIN: 12.7 g/dL (ref 12.0–15.0)
LYMPHS ABS: 0.9 10*3/uL (ref 0.7–4.0)
Lymphocytes Relative: 11 % — ABNORMAL LOW (ref 12–46)
MCH: 31.1 pg (ref 26.0–34.0)
MCHC: 33.9 g/dL (ref 30.0–36.0)
MCV: 91.9 fL (ref 78.0–100.0)
Monocytes Absolute: 0.5 10*3/uL (ref 0.1–1.0)
Monocytes Relative: 6 % (ref 3–12)
NEUTROS ABS: 6.6 10*3/uL (ref 1.7–7.7)
Neutrophils Relative %: 80 % — ABNORMAL HIGH (ref 43–77)
Platelets: 145 10*3/uL — ABNORMAL LOW (ref 150–400)
RBC: 4.08 MIL/uL (ref 3.87–5.11)
RDW: 14.7 % (ref 11.5–15.5)
WBC: 8.3 10*3/uL (ref 4.0–10.5)

## 2013-05-30 LAB — BLOOD GAS, ARTERIAL
Acid-Base Excess: 16.9 mmol/L — ABNORMAL HIGH (ref 0.0–2.0)
BICARBONATE: 42.8 meq/L — AB (ref 20.0–24.0)
Drawn by: 234301
O2 Content: 2 L/min
O2 Saturation: 92 %
PCO2 ART: 71 mmHg — AB (ref 35.0–45.0)
PH ART: 7.397 (ref 7.350–7.450)
PO2 ART: 65.6 mmHg — AB (ref 80.0–100.0)
Patient temperature: 37
TCO2: 38.4 mmol/L (ref 0–100)

## 2013-05-30 LAB — INFLUENZA PANEL BY PCR (TYPE A & B)
H1N1 flu by pcr: NOT DETECTED
Influenza A By PCR: NEGATIVE
Influenza B By PCR: NEGATIVE

## 2013-05-30 LAB — URINALYSIS, ROUTINE W REFLEX MICROSCOPIC
BILIRUBIN URINE: NEGATIVE
GLUCOSE, UA: NEGATIVE mg/dL
Hgb urine dipstick: NEGATIVE
KETONES UR: NEGATIVE mg/dL
Leukocytes, UA: NEGATIVE
Nitrite: NEGATIVE
Protein, ur: NEGATIVE mg/dL
Specific Gravity, Urine: <1.005 — ABNORMAL LOW (ref 1.005–1.030)
Urobilinogen, UA: 0.2 mg/dL (ref 0.0–1.0)
pH: 6 (ref 5.0–8.0)

## 2013-05-30 LAB — COMPREHENSIVE METABOLIC PANEL
ALT: 24 U/L (ref 0–35)
AST: 27 U/L (ref 0–37)
Albumin: 3.6 g/dL (ref 3.5–5.2)
Alkaline Phosphatase: 105 U/L (ref 39–117)
BILIRUBIN TOTAL: 0.4 mg/dL (ref 0.3–1.2)
BUN: 7 mg/dL (ref 6–23)
CHLORIDE: 86 meq/L — AB (ref 96–112)
CO2: 44 mEq/L (ref 19–32)
CREATININE: 0.3 mg/dL — AB (ref 0.50–1.10)
Calcium: 9.6 mg/dL (ref 8.4–10.5)
GLUCOSE: 111 mg/dL — AB (ref 70–99)
Potassium: 3.2 mEq/L — ABNORMAL LOW (ref 3.7–5.3)
SODIUM: 138 meq/L (ref 137–147)
Total Protein: 7.4 g/dL (ref 6.0–8.3)

## 2013-05-30 LAB — MRSA PCR SCREENING: MRSA BY PCR: NEGATIVE

## 2013-05-30 LAB — TROPONIN I: Troponin I: <0.3 ng/mL (ref <0.30)

## 2013-05-30 LAB — PRO B NATRIURETIC PEPTIDE: Pro B Natriuretic peptide (BNP): 103.2 pg/mL (ref 0–125)

## 2013-05-30 LAB — LACTIC ACID, PLASMA: Lactic Acid, Venous: 1.5 mmol/L (ref 0.5–2.2)

## 2013-05-30 MED ORDER — POTASSIUM CHLORIDE IN NACL 20-0.9 MEQ/L-% IV SOLN
INTRAVENOUS | Status: DC
  Administered 2013-05-30 – 2013-05-31 (×3): via INTRAVENOUS

## 2013-05-30 MED ORDER — SODIUM CHLORIDE 0.9 % IJ SOLN
3.0000 mL | Freq: Two times a day (BID) | INTRAMUSCULAR | Status: DC
  Administered 2013-05-30 – 2013-06-09 (×18): 3 mL via INTRAVENOUS

## 2013-05-30 MED ORDER — ONDANSETRON HCL 4 MG/2ML IJ SOLN
4.0000 mg | Freq: Four times a day (QID) | INTRAMUSCULAR | Status: DC | PRN
Start: 2013-05-30 — End: 2013-06-10
  Filled 2013-05-30: qty 2

## 2013-05-30 MED ORDER — IPRATROPIUM BROMIDE 0.02 % IN SOLN
0.5000 mg | Freq: Once | RESPIRATORY_TRACT | Status: AC
  Administered 2013-05-30: 0.5 mg via RESPIRATORY_TRACT
  Filled 2013-05-30: qty 2.5

## 2013-05-30 MED ORDER — ALBUTEROL SULFATE (2.5 MG/3ML) 0.083% IN NEBU
5.0000 mg | INHALATION_SOLUTION | Freq: Once | RESPIRATORY_TRACT | Status: AC
  Administered 2013-05-30: 5 mg via RESPIRATORY_TRACT
  Filled 2013-05-30: qty 6

## 2013-05-30 MED ORDER — SIMVASTATIN 20 MG PO TABS
20.0000 mg | ORAL_TABLET | Freq: Every day | ORAL | Status: DC
  Administered 2013-05-30 – 2013-06-09 (×11): 20 mg via ORAL
  Filled 2013-05-30 (×13): qty 1

## 2013-05-30 MED ORDER — LATANOPROST 0.005 % OP SOLN
1.0000 [drp] | Freq: Every day | OPHTHALMIC | Status: DC
  Administered 2013-06-01 – 2013-06-09 (×9): 1 [drp] via OPHTHALMIC
  Filled 2013-05-30 (×2): qty 2.5

## 2013-05-30 MED ORDER — PANTOPRAZOLE SODIUM 40 MG PO TBEC
40.0000 mg | DELAYED_RELEASE_TABLET | Freq: Every day | ORAL | Status: DC
  Administered 2013-05-30 – 2013-06-01 (×3): 40 mg via ORAL
  Filled 2013-05-30 (×3): qty 1

## 2013-05-30 MED ORDER — METHYLPREDNISOLONE SODIUM SUCC 125 MG IJ SOLR
60.0000 mg | Freq: Four times a day (QID) | INTRAMUSCULAR | Status: DC
  Administered 2013-05-30 – 2013-05-31 (×3): 60 mg via INTRAVENOUS
  Filled 2013-05-30 (×3): qty 2

## 2013-05-30 MED ORDER — LEVOFLOXACIN IN D5W 750 MG/150ML IV SOLN
750.0000 mg | INTRAVENOUS | Status: DC
  Administered 2013-05-30 – 2013-06-06 (×8): 750 mg via INTRAVENOUS
  Filled 2013-05-30 (×12): qty 150

## 2013-05-30 MED ORDER — ACETAMINOPHEN 325 MG PO TABS
650.0000 mg | ORAL_TABLET | Freq: Four times a day (QID) | ORAL | Status: DC | PRN
  Administered 2013-05-30 – 2013-06-07 (×5): 650 mg via ORAL
  Filled 2013-05-30 (×6): qty 2

## 2013-05-30 MED ORDER — ALBUTEROL SULFATE (2.5 MG/3ML) 0.083% IN NEBU
2.5000 mg | INHALATION_SOLUTION | Freq: Three times a day (TID) | RESPIRATORY_TRACT | Status: DC
  Administered 2013-05-30 – 2013-06-02 (×8): 2.5 mg via RESPIRATORY_TRACT
  Filled 2013-05-30 (×9): qty 3

## 2013-05-30 MED ORDER — ACETAMINOPHEN 500 MG PO TABS: 500.0000 mg | ORAL_TABLET | Freq: Four times a day (QID) | ORAL | Status: DC | PRN

## 2013-05-30 MED ORDER — POTASSIUM CHLORIDE CRYS ER 20 MEQ PO TBCR
20.0000 meq | EXTENDED_RELEASE_TABLET | Freq: Every day | ORAL | Status: DC
  Administered 2013-05-30 – 2013-06-02 (×4): 20 meq via ORAL
  Filled 2013-05-30 (×4): qty 1

## 2013-05-30 MED ORDER — SACCHAROMYCES BOULARDII 250 MG PO CAPS
250.0000 mg | ORAL_CAPSULE | Freq: Every day | ORAL | Status: DC
  Administered 2013-05-31 – 2013-06-09 (×10): 250 mg via ORAL
  Filled 2013-05-30 (×12): qty 1

## 2013-05-30 MED ORDER — LEVOFLOXACIN IN D5W 750 MG/150ML IV SOLN
750.0000 mg | INTRAVENOUS | Status: DC
  Filled 2013-05-30: qty 150

## 2013-05-30 MED ORDER — TIOTROPIUM BROMIDE MONOHYDRATE 18 MCG IN CAPS
18.0000 ug | ORAL_CAPSULE | Freq: Every day | RESPIRATORY_TRACT | Status: DC
  Administered 2013-05-31 – 2013-06-01 (×2): 18 ug via RESPIRATORY_TRACT
  Filled 2013-05-30: qty 5

## 2013-05-30 MED ORDER — BACLOFEN 10 MG PO TABS
10.0000 mg | ORAL_TABLET | Freq: Two times a day (BID) | ORAL | Status: DC
  Administered 2013-05-30 – 2013-06-04 (×11): 10 mg via ORAL
  Filled 2013-05-30 (×18): qty 1

## 2013-05-30 MED ORDER — FLUTICASONE PROPIONATE 50 MCG/ACT NA SUSP
2.0000 | Freq: Every day | NASAL | Status: DC
  Administered 2013-05-30 – 2013-06-01 (×3): 2 via NASAL
  Filled 2013-05-30: qty 16

## 2013-05-30 MED ORDER — LORATADINE 10 MG PO TABS
10.0000 mg | ORAL_TABLET | Freq: Every day | ORAL | Status: DC
  Administered 2013-05-31 – 2013-06-09 (×10): 10 mg via ORAL
  Filled 2013-05-30 (×12): qty 1

## 2013-05-30 MED ORDER — ALBUTEROL SULFATE (2.5 MG/3ML) 0.083% IN NEBU
2.5000 mg | INHALATION_SOLUTION | RESPIRATORY_TRACT | Status: DC | PRN
  Administered 2013-05-30 – 2013-06-02 (×4): 2.5 mg via RESPIRATORY_TRACT
  Filled 2013-05-30 (×4): qty 3

## 2013-05-30 MED ORDER — ENOXAPARIN SODIUM 40 MG/0.4ML ~~LOC~~ SOLN
40.0000 mg | SUBCUTANEOUS | Status: DC
  Administered 2013-05-30 – 2013-06-04 (×6): 40 mg via SUBCUTANEOUS
  Filled 2013-05-30 (×7): qty 0.4

## 2013-05-30 MED ORDER — ASPIRIN 81 MG PO CHEW
324.0000 mg | CHEWABLE_TABLET | Freq: Once | ORAL | Status: AC
  Administered 2013-05-30: 324 mg via ORAL
  Filled 2013-05-30: qty 4

## 2013-05-30 MED ORDER — FUROSEMIDE 10 MG/ML IJ SOLN
40.0000 mg | Freq: Once | INTRAMUSCULAR | Status: AC
  Administered 2013-05-30: 40 mg via INTRAVENOUS
  Filled 2013-05-30: qty 4

## 2013-05-30 MED ORDER — LORAZEPAM 1 MG PO TABS
1.0000 mg | ORAL_TABLET | Freq: Two times a day (BID) | ORAL | Status: DC
  Administered 2013-05-30 – 2013-06-01 (×5): 1 mg via ORAL
  Filled 2013-05-30 (×6): qty 2

## 2013-05-30 MED ORDER — ONDANSETRON HCL 4 MG PO TABS: 4.0000 mg | ORAL_TABLET | Freq: Four times a day (QID) | ORAL | Status: DC | PRN

## 2013-05-30 MED ORDER — CARVEDILOL 6.25 MG PO TABS
6.2500 mg | ORAL_TABLET | Freq: Two times a day (BID) | ORAL | Status: DC
  Administered 2013-05-30 – 2013-06-09 (×21): 6.25 mg via ORAL
  Filled 2013-05-30: qty 2
  Filled 2013-05-30 (×2): qty 1
  Filled 2013-05-30 (×2): qty 2
  Filled 2013-05-30 (×10): qty 1
  Filled 2013-05-30: qty 2
  Filled 2013-05-30: qty 1
  Filled 2013-05-30: qty 2
  Filled 2013-05-30 (×4): qty 1
  Filled 2013-05-30: qty 2
  Filled 2013-05-30: qty 1

## 2013-05-30 MED ORDER — MECLIZINE HCL 25 MG PO TABS
25.0000 mg | ORAL_TABLET | Freq: Every day | ORAL | Status: DC | PRN
  Filled 2013-05-30: qty 1

## 2013-05-30 MED ORDER — DOCUSATE SODIUM 100 MG PO CAPS
100.0000 mg | ORAL_CAPSULE | Freq: Every day | ORAL | Status: DC
  Administered 2013-05-30 – 2013-06-01 (×3): 100 mg via ORAL
  Filled 2013-05-30 (×4): qty 1

## 2013-05-30 MED ORDER — ACETAMINOPHEN 650 MG RE SUPP: 650.0000 mg | Freq: Four times a day (QID) | RECTAL | Status: DC | PRN

## 2013-05-30 MED ORDER — POLYETHYLENE GLYCOL 3350 17 G PO PACK
17.0000 g | PACK | Freq: Every day | ORAL | Status: DC
  Administered 2013-06-01 – 2013-06-09 (×6): 17 g via ORAL
  Filled 2013-05-30 (×11): qty 1

## 2013-05-30 NOTE — ED Notes (Signed)
Patient arrives from [facility] NH with c/o shortness of breath that started this morning. H/o CHF, Respiratory failure. Alert/oriented x 4. Able to speak in full sentences.

## 2013-05-30 NOTE — ED Provider Notes (Signed)
CSN: [id number]     Arrival date & time    History  This chart was scribed for Ezequiel Essex, MD by Ludger Nutting, ED Scribe. This patient was seen in room APA06/APA06 and the patient's care was started 9:46 AM.    Chief Complaint  Patient presents with  . Shortness of Breath    The history is provided by the patient. No language interpreter was used.    HPI Comments: Karen Collier is a 68 y.o. female with past medical history of CHF, COPD, asthma, hypoxemia, HTN who presents to the Emergency Department complaining of constant, gradually worsened SOB that began this morning. She reports having a cough and sneezing that began 4 days. She states the cough has been productive of white sputum and she has abdominal pain present only with coughing. She reports using 2L O2 at home. She is wheelchair bound and is here from [place] home. She reports sick contacts. She received the flu vaccine this season. She denies known fever, chest pain, increased leg swelling, vomiting, significant weight change.   Past Medical History  Diagnosis Date  . Muscular dystrophy   . COPD (chronic obstructive pulmonary disease)   . Asthma   . Hypoxemia   . CHF (congestive heart failure)   . Chronic pain   . Hypertension   . Glaucoma   . Muscle weakness   . Chronic respiratory failure with hypercapnia     And hypoxia  . Atelectasis 11/21/2011  . Lung nodule 11/21/2011  . Thyroid nodule 11/21/2011  . Compensated alkalosis 11/21/2011  . Pneumonia 11/21/2011  . Hyponatremia Recurrent    Normalizes with normal saline  . GERD (gastroesophageal reflux disease)   . Acute diastolic CHF (congestive heart failure) 03/28/2013   Past Surgical History  Procedure Laterality Date  . Cholecystectomy    . Abdominal hysterectomy    . Colonoscopy N/A 12/07/2012    [id number] lipoma; otherwise negative examination  . Esophagogastroduodenoscopy N/A 12/07/2012    [id number] polyps. Hiatal hernia. Status post gastric bx,  chronic gastritis noted.    Family History  Problem Relation Age of Onset  . Colon cancer Neg Hx    History  Substance Use Topics  . Smoking status: Never Smoker   . Smokeless tobacco: Not on file  . Alcohol Use: No   OB History   Grav Para Term Preterm Abortions TAB SAB Ect Mult Living                 Review of Systems A complete 10 system review of systems was obtained and all systems are negative except as noted in the HPI and PMH.   Allergies  Review of patient's allergies indicates no known allergies.  Home Medications   No current outpatient prescriptions on file. BP 133/54  Pulse 104  Temp(Src) 98.2 F (36.8 C) (Oral)  Resp 19  Ht 5\' 7"  (1.702 m)  Wt 222 lb 14.2 oz (101.1 kg)  BMI 34.90 kg/m2  SpO2 92% Physical Exam  Nursing note and vitals reviewed. Constitutional: She is oriented to person, place, and time. She appears well-developed and well-nourished.  HENT:  Head: Normocephalic and atraumatic.  Cardiovascular: Normal rate, regular rhythm and normal heart sounds.   Pulmonary/Chest: Tachypnea noted. She is in respiratory distress. She has decreased breath sounds (at the bases). She has rhonchi.  Mildly tachypneic, speaking in short sentences. Rhoncherous with decreased breath sounds at the bases. Wet cough present.   Abdominal: She exhibits no distension.  Musculoskeletal:  She exhibits edema (Trace pedal edema. ).  Neurological: She is alert and oriented to person, place, and time.  Skin: Skin is warm and dry.  Psychiatric: She has a normal mood and affect.    ED Course  Procedures (including critical care time)  DIAGNOSTIC STUDIES: Oxygen Saturation is 94% on 2 L, adequate by my interpretation.    COORDINATION OF CARE: 9:46 AM Discussed treatment plan with pt at bedside and pt agreed to plan.   Labs Review Labs Reviewed  CBC WITH DIFFERENTIAL - Abnormal; Notable for the following:    Platelets 145 (*)    Neutrophils Relative % 80 (*)     Lymphocytes Relative 11 (*)    All other components within normal limits  COMPREHENSIVE METABOLIC PANEL - Abnormal; Notable for the following:    Potassium 3.2 (*)    Chloride 86 (*)    CO2 44 (*)    Glucose, Bld 111 (*)    Creatinine, Ser 0.30 (*)    All other components within normal limits  URINALYSIS, ROUTINE W REFLEX MICROSCOPIC - Abnormal; Notable for the following:    Specific Gravity, Urine <1.005 (*)    All other components within normal limits  BLOOD GAS, ARTERIAL - Abnormal; Notable for the following:    pCO2 arterial 71.0 (*)    pO2, Arterial 65.6 (*)    Bicarbonate 42.8 (*)    Acid-Base Excess 16.9 (*)    All other components within normal limits  MRSA PCR SCREENING  TROPONIN I  PRO B NATRIURETIC PEPTIDE  LACTIC ACID, PLASMA  INFLUENZA PANEL BY PCR (TYPE A & B, H1N1)  INFLUENZA PANEL BY PCR (TYPE A & B, H1N1)   Imaging Review Dg Chest Portable 1 View  05/25/2013   CLINICAL DATA:  Cough and congestion for 4 days, history COPD, asthma, hypertension, CHF  EXAM: PORTABLE CHEST - 1 VIEW  COMPARISON:  Portable exam 1006 hr compared to 03/26/2013  FINDINGS: Very low lung volumes.  Right apex obscured by patient's chin.  Normal heart size and mediastinal contours.  Bibasilar atelectasis.  No definite acute infiltrate or pleural effusion.  No gross pneumothorax or acute osseous findings.  IMPRESSION: Low lung volumes with bibasilar atelectasis.   Electronically Signed   By: Lavonia Dana M.D.   On: 05/25/2013 10:15    EKG Interpretation    Date/Time:  Wednesday May 30 2013 09:49:09 EST Ventricular Rate:  93 PR Interval:  168 QRS Duration: 84 QT Interval:  364 QTC Calculation: 452 R Axis:   13 Text Interpretation:  Normal sinus rhythm Normal ECG When compared with ECG of 26-Mar-2013 05:33, No significant change was found No significant change was found Confirmed by Wyvonnia Dusky  MD, Hildur Bayer ([id number]) on 06/04/2013 10:17:07 AM            MDM   1. Respiratory failure    2. CHF (congestive heart failure)   3. Chronic respiratory failure with hypercapnia   4. COPD (chronic obstructive pulmonary disease) with acute bronchitis   5. Hypokalemia   6. Muscular dystrophy    One day history of shortness of breath, cough, congestion. No fever or chest pain. History of COPD on home oxygen, diastolic CHF.  Chest x-ray shows poor inspiratory effort and low lung volumes. Patient given nebulizers and steroids on arrival. ABG shows stated respiratory acidosis with PCO2 of 71. Patient placed on BiPAP with increased in her comfort i and decreased work of breathing.  Possible component of diastolic CHF as well. Small  dose of IV Lasix given. Comfortable on Bipap.  Stepdown admission d/w Dr. Daleen Bo.   CRITICAL CARE Performed by: Ezequiel Essex Total critical care time: 30 Critical care time was exclusive of separately billable procedures and treating other patients. Critical care was necessary to treat or prevent imminent or life-threatening deterioration. Critical care was time spent personally by me on the following activities: development of treatment plan with patient and/or surrogate as well as nursing, discussions with consultants, evaluation of patient's response to treatment, examination of patient, obtaining history from patient or surrogate, ordering and performing treatments and interventions, ordering and review of laboratory studies, ordering and review of radiographic studies, pulse oximetry and re-evaluation of patient's condition.   I personally performed the services described in this documentation, which was scribed in my presence. The recorded information has been reviewed and is accurate.   Ezequiel Essex, MD 06/06/2013 [phone]

## 2013-05-30 NOTE — ED Notes (Signed)
CRITICAL VALUE ALERT  Critical value received:  CO2 (expected critical value)  Date of notification:  05/29/2013  Time of notification:  1100  Critical value read back:yes  Nurse who received alert:  Jeanie Sewer  MD notified (1st page):  Rancour

## 2013-05-30 NOTE — H&P (Signed)
[facility] History and Physical  MARGARETH MEDEIROS [id number] DOB: 07-17-1945 DOA: 05/23/2013  Referring physician:  PCP: Delphina Cahill, MD  Specialists:   Chief Complaint: SOB  HPI: Karen Collier is a 68 y.o. female with PMH of HTN, HPL, CHF, COPD, muscular dystrophy, chronic respiratory failure on home oxygen presented with SOB, non productive cough, chills for few days and found to have hypoxic in ED, started on BiPAP. She reports using inhalers more frequent than usual; She denies chest pain, no nausea, vomiting, or diarrhea; she cant walk due to muscular dystrophy   Review of Systems: The patient denies anorexia, fever, weight loss,, vision loss, decreased hearing, hoarseness, chest pain, syncope, eripheral edema, balance deficits, hemoptysis, abdominal pain, melena, hematochezia, severe indigestion/heartburn, hematuria, incontinence, genital sores, muscle weakness, suspicious skin lesions, transient blindness,  depression, unusual weight change, abnormal bleeding, enlarged lymph nodes, angioedema, and breast masses.    Past Medical History  Diagnosis Date  . Muscular dystrophy   . COPD (chronic obstructive pulmonary disease)   . Asthma   . Hypoxemia   . CHF (congestive heart failure)   . Chronic pain   . Hypertension   . Glaucoma   . Muscle weakness   . Chronic respiratory failure with hypercapnia     And hypoxia  . Atelectasis 11/21/2011  . Lung nodule 11/21/2011  . Thyroid nodule 11/21/2011  . Compensated alkalosis 11/21/2011  . Pneumonia 11/21/2011  . Hyponatremia Recurrent    Normalizes with normal saline  . GERD (gastroesophageal reflux disease)   . Acute diastolic CHF (congestive heart failure) 03/28/2013   Past Surgical History  Procedure Laterality Date  . Cholecystectomy    . Abdominal hysterectomy    . Colonoscopy N/A 12/07/2012    [id number] lipoma; otherwise negative examination  . Esophagogastroduodenoscopy N/A 12/07/2012    [id number] polyps. Hiatal hernia.  Status post gastric bx, chronic gastritis noted.    Social History:  reports that she has never smoked. She does not have any smokeless tobacco history on file. She reports that she does not drink alcohol or use illicit drugs. ASL;  where does patient live--home, ALF, SNF? and with whom if at home? No;  Can patient participate in ADLs?  No Known Allergies  Family History  Problem Relation Age of Onset  . Colon cancer Neg Hx     (be sure to complete)  Prior to Admission medications   Medication Sig Start Date End Date Taking? Authorizing Provider  acetaminophen (TYLENOL) 500 MG tablet Take 500 mg by mouth every 6 (six) hours as needed for mild pain.   Yes Historical Provider, MD  albuterol (PROVENTIL) (2.5 MG/3ML) 0.083% nebulizer solution Take 2.5 mg by nebulization 3 (three) times daily as needed for shortness of breath.   Yes Historical Provider, MD  baclofen (LIORESAL) 10 MG tablet Take 10 mg by mouth 2 (two) times daily.   Yes Historical Provider, MD  Calcium Carbonate-Vitamin D (CALCIUM 600 + D PO) Take 1 tablet by mouth daily.   Yes Historical Provider, MD  carvedilol (COREG) 6.25 MG tablet Take 1 tablet (6.25 mg total) by mouth 2 (two) times daily. 11/23/11  Yes Rexene Alberts, MD  docusate sodium (COLACE) 100 MG capsule Take 100 mg by mouth at bedtime.    Yes Historical Provider, MD  fexofenadine (ALLEGRA) 180 MG tablet Take 180 mg by mouth daily.   Yes Historical Provider, MD  fluticasone (FLONASE) 50 MCG/ACT nasal spray Place 2 sprays into both nostrils daily.   Yes  Historical Provider, MD  furosemide (LASIX) 20 MG tablet Take 2 tablets (40 mg total) by mouth 2 (two) times daily. 03/29/13  Yes Lezlie Octave Black, NP  latanoprost (XALATAN) 0.005 % ophthalmic solution Place 1 drop into both eyes at bedtime.   Yes Historical Provider, MD  liver oil-zinc oxide (DESITIN) 40 % ointment Apply 1 application topically as needed for irritation (apply to affected areas as needed for irritation).    Yes Historical Provider, MD  LORazepam (ATIVAN) 1 MG tablet Take 1 tablet (1 mg total) by mouth 2 (two) times daily. 11/23/11  Yes Rexene Alberts, MD  meclizine (ANTIVERT) 25 MG tablet Take 25 mg by mouth daily as needed for dizziness.   Yes Historical Provider, MD  metolazone (ZAROXOLYN) 2.5 MG tablet Take 1 tablet by mouth as directed. Take 30 minutes before taking Lasix on Monday, Wednesday, and Friday. 04/30/13  Yes Historical Provider, MD  Multiple Vitamin (DAILY VITE PO) Take 1 tablet by mouth daily.   Yes Historical Provider, MD  naproxen (NAPROSYN) 500 MG tablet Take 500 mg by mouth 2 (two) times daily with a meal.   Yes Historical Provider, MD  omeprazole (PRILOSEC) 20 MG capsule Take 1 capsule (20 mg total) by mouth daily. 11/27/12  Yes Orvil Feil, NP  oxymetazoline (NASAL RELIEF) 0.05 % nasal spray Place 2 sprays into both nostrils every 6 (six) hours as needed for congestion.   Yes Historical Provider, MD  Polyethyl Glycol-Propyl Glycol 0.4-0.3 % SOLN Apply 1 drop to eye as needed (instill 1-2 drops in each eye as needed for dry eyes).   Yes Historical Provider, MD  polyethylene glycol powder (GLYCOLAX/MIRALAX) powder Take 17 g by mouth daily. As needed for a bowel movement daily. 01/23/13  Yes Orvil Feil, NP  potassium chloride SA (K-DUR,KLOR-CON) 20 MEQ tablet Take 20 mEq by mouth 2 (two) times daily.   Yes Historical Provider, MD  Probiotic Product (ALIGN) 4 MG CAPS Take 1 capsule by mouth daily.   Yes Historical Provider, MD  simethicone (MYLICON) 80 MG chewable tablet Chew 80 mg by mouth every 6 (six) hours as needed for flatulence.   Yes Historical Provider, MD  simvastatin (ZOCOR) 20 MG tablet Take 20 mg by mouth at bedtime.   Yes Historical Provider, MD  SPIRIVA HANDIHALER 18 MCG inhalation capsule Place 18 mcg into inhaler and inhale daily.  11/20/12  Yes Historical Provider, MD   Physical Exam: Filed Vitals:   06/14/2013 1102  BP:   Pulse: 94  Temp:   Resp: 27     General:   alert  Eyes: eom-i, perrla  ENT: no oral ulcers  Neck: supple   Cardiovascular: s1,s2 tachycardia   Respiratory: poor ventilation BL   Abdomen: soft, nt, obese   Skin: no rash  Musculoskeletal: LE non pitting edema   Psychiatric: no hallucinations   Neurologic: CN 2-12 intact motor 2/5 BL LE  Labs on Admission:  Basic Metabolic Panel:  Recent Labs Lab 2013/06/14 1022  NA 138  K 3.2*  CL 86*  CO2 44*  GLUCOSE 111*  BUN 7  CREATININE 0.30*  CALCIUM 9.6   Liver Function Tests:  Recent Labs Lab 06/14/2013 1022  AST 27  ALT 24  ALKPHOS 105  BILITOT 0.4  PROT 7.4  ALBUMIN 3.6   No results found for this basename: LIPASE, AMYLASE,  in the last 168 hours No results found for this basename: AMMONIA,  in the last 168 hours CBC:  Recent Labs Lab  2013/05/31 1022  WBC 8.3  NEUTROABS 6.6  HGB 12.7  HCT 37.5  MCV 91.9  PLT 145*   Cardiac Enzymes:  Recent Labs Lab 2013-05-31 1022  TROPONINI <0.30    BNP (last 3 results)  Recent Labs  03/26/13 0534 May 31, 2013 1022  PROBNP 85.2 103.2   CBG: No results found for this basename: GLUCAP,  in the last 168 hours  Radiological Exams on Admission: Dg Chest Portable 1 View  2013-05-31   CLINICAL DATA:  Cough and congestion for 4 days, history COPD, asthma, hypertension, CHF  EXAM: PORTABLE CHEST - 1 VIEW  COMPARISON:  Portable exam 1006 hr compared to 03/26/2013  FINDINGS: Very low lung volumes.  Right apex obscured by patient's chin.  Normal heart size and mediastinal contours.  Bibasilar atelectasis.  No definite acute infiltrate or pleural effusion.  No gross pneumothorax or acute osseous findings.  IMPRESSION: Low lung volumes with bibasilar atelectasis.   Electronically Signed   By: Lavonia Dana M.D.   On: 05/31/13 10:15    EKG: Independently reviewed. NSR, tachycradia   Assessment/Plan Principal Problem:   Respiratory failure Active Problems:   COPD (chronic obstructive pulmonary disease) with acute  bronchitis   Muscular dystrophy   Chronic respiratory failure with hypercapnia   68 y.o. female with PMH of HTN, HPL, CHF, COPD, muscular dystrophy, chronic respiratory failure on home oxygen presented with SOB admitted with respiratory failure/COPD  1. COPD exacerbation; CXR: no clear infiltrate;  -started IV steroids, atx, cont bronchodilator, oxygen, prn BiPAP; check influenza  2. Acute on chronic respiratory failure due to COPD: ABG: 7.39-71-71-42 -cont as above, BiPAP prn   3. Chronic CHF;  Echo (2014): LVH, likely diastolic dysfunction; received IV lasix on 1/14 in ED -hold diuretics 1/14 due to alkalosis/likely over diuresed on top of compensation for resp acidosis -resume in AM; daily weight, I/O;     4. Hypo K; replace lytes;   5. HTN cont home regimen   None;  if consultant consulted, please document name and whether formally or informally consulted  Code Status: full (must indicate code status--if unknown or must be presumed, indicate so) Family Communication: d/w patient, DPOA at the bedside (indicate person spoken with, if applicable, with phone number if by telephone) Disposition Plan: pend clinical improvement  (indicate anticipated LOS)  Time spent: >35 minutes   [place], [facility] Pager [phone]  If 7PM-7AM, please contact night-coverage [email or web address] Password [email or web address] 05/31/13, 12:16 PM

## 2013-05-30 NOTE — ED Notes (Signed)
CRITICAL VALUE ALERT  Critical value received: ABG ph 7.39 pco2 71  Date of notification:  05/20/2013  Time of notification:  2536  Critical value read back: yes Nurse who received alert:  Ilda Mori  MD notified (1st page):  rancour  Time of first page:  1038  MD notified (2nd page):  Time of second page:  Responding MD:  rancour  Time MD responded:  1038

## 2013-05-31 LAB — BASIC METABOLIC PANEL
BUN: 8 mg/dL (ref 6–23)
CALCIUM: 9.1 mg/dL (ref 8.4–10.5)
CO2: 41 mEq/L (ref 19–32)
CREATININE: 0.26 mg/dL — AB (ref 0.50–1.10)
Chloride: 85 mEq/L — ABNORMAL LOW (ref 96–112)
Glucose, Bld: 146 mg/dL — ABNORMAL HIGH (ref 70–99)
Potassium: 3.6 mEq/L — ABNORMAL LOW (ref 3.7–5.3)
Sodium: 135 mEq/L — ABNORMAL LOW (ref 137–147)

## 2013-05-31 LAB — CBC
HCT: 34.7 % — ABNORMAL LOW (ref 36.0–46.0)
Hemoglobin: 12 g/dL (ref 12.0–15.0)
MCH: 31.1 pg (ref 26.0–34.0)
MCHC: 34.6 g/dL (ref 30.0–36.0)
MCV: 89.9 fL (ref 78.0–100.0)
PLATELETS: 147 10*3/uL — AB (ref 150–400)
RBC: 3.86 MIL/uL — ABNORMAL LOW (ref 3.87–5.11)
RDW: 14.1 % (ref 11.5–15.5)
WBC: 6.9 10*3/uL (ref 4.0–10.5)

## 2013-05-31 MED ORDER — FUROSEMIDE 10 MG/ML IJ SOLN
20.0000 mg | Freq: Once | INTRAMUSCULAR | Status: AC
  Administered 2013-05-31: 20 mg via INTRAVENOUS
  Filled 2013-05-31: qty 2

## 2013-05-31 MED ORDER — METHYLPREDNISOLONE SODIUM SUCC 40 MG IJ SOLR
40.0000 mg | Freq: Four times a day (QID) | INTRAMUSCULAR | Status: DC
  Administered 2013-05-31 – 2013-06-02 (×9): 40 mg via INTRAVENOUS
  Filled 2013-05-31 (×12): qty 1

## 2013-05-31 MED ORDER — FUROSEMIDE 20 MG PO TABS
20.0000 mg | ORAL_TABLET | Freq: Every day | ORAL | Status: DC
  Administered 2013-05-31: 20 mg via ORAL
  Filled 2013-05-31: qty 1

## 2013-05-31 MED ORDER — SIMETHICONE 80 MG PO CHEW
160.0000 mg | CHEWABLE_TABLET | Freq: Four times a day (QID) | ORAL | Status: DC | PRN
  Administered 2013-05-31 – 2013-06-01 (×2): 160 mg via ORAL
  Filled 2013-05-31 (×3): qty 2

## 2013-05-31 NOTE — Progress Notes (Signed)
UR chart review completed.  

## 2013-05-31 NOTE — Clinical Social Work Psychosocial (Signed)
Clinical Social Work Department BRIEF PSYCHOSOCIAL ASSESSMENT 05/31/2013  Patient:  Karen Collier, Karen Collier     Account Number:  [id number]     Admit date:  05/26/2013  Clinical Social Worker:  Norina Buzzard INTERN  Date/Time:  05/31/2013 10:05 AM  Referred by:  CSW  Date Referred:  05/31/2013 Referred for  ALF Placement   Other Referral:   Interview type:  Patient Other interview type:    PSYCHOSOCIAL DATA Living Status:  FACILITY Admitted from facility:  [facility] Level of care:  Assisted Living Primary support name:  Juliann Pulse Primary support relationship to patient:  FRIEND Degree of support available:   supportive    CURRENT CONCERNS Current Concerns  Post-Acute Placement   Other Concerns:    SOCIAL WORK ASSESSMENT / PLAN Met with pt at bedside. Pt alert and oriented. Pt reports she was brought to hospital for COPD. She explained she has been at [facility] for 3 years. Pt requires assistance with most ADLs, able to feed herself. She uses a wheelchair at facility, but is able to transfer independently. Per pt her best support is her friend Juliann Pulse who comes to visit her 2 to 3 times a week. Pt's plan is to return to [facility] at d/c.    Spoke with Butch Penny at facility who stated pt has been with them for 3 years. She reports pt requires assistance with most ADLs other than feeding and transfering. Butch Penny confirms pt's friend Juliann Pulse comes to see her weekly. Pt is on 2ltrs. of Oxygen at facility and requires nebulizer treatments. Facility agreeable to pt return at d/c.   Assessment/plan status:  Psychosocial Support/Ongoing Assessment of Needs Other assessment/ plan:   Information/referral to community resources:   [facility]    PATIENT'S/FAMILY'S RESPONSE TO PLAN OF CARE: Pt and facility agreeable to return at d/c. CSW will continue to follow.      Delfina Redwood BSW Intern  Benay Pike, LCSW

## 2013-05-31 NOTE — Progress Notes (Signed)
Transfer to telemetry unit cancelled. Patient quickly desaturated when placed on bedpan, became flushed, tachypneic, and c/o "not feeling well." These findings reported to Dr Daleen Bo.

## 2013-05-31 NOTE — Progress Notes (Addendum)
[facility] PROGRESS NOTE  Karen Collier OXB:[id number] DOB: 29-Mar-1946 DOA: 05/23/2013 PCP: Delphina Cahill, MD  Assessment/Plan: Principal Problem:  Respiratory failure  Active Problems:  COPD (chronic obstructive pulmonary disease) with acute bronchitis  Muscular dystrophy  Chronic respiratory failure with hypercapnia   68 y.o. female with PMH of HTN, HPL, CHF, COPD, muscular dystrophy, chronic respiratory failure on home oxygen presented with SOB admitted with respiratory failure/COPD   1. COPD exacerbation; CXR: no clear infiltrate;  -improving off BiPAP; cont/taper IV steroids, atx, cont bronchodilator, oxygen, prn BiPAP;   2. Acute on chronic respiratory failure due to COPD: ABG: [id number]  -improved; cont as above, BiPAP prn   3. Chronic CHF; Echo (2014): LVH, likely diastolic dysfunction; alkalosis/likely over diuresed on top of compensation for resp acidosis on admission;  -resume lasix 1/14; daily weight, I/O;   4. Hypo K; replace lytes;   5. HTN cont home regimen   6. Muscular dystropy non ambulatory; cont supportive care     Code Status: full Family Communication: d/w patient, d/w DPOA yesterday (indicate person spoken with, relationship, and if by phone, the number) Disposition Plan: AL in 2-3 days    Consultants:  None   Procedures:  None   Antibiotics:  Levofloxacin 1/14<<< (indicate start date, and stop date if known)  HPI/Subjective: alert  Objective: Filed Vitals:   05/31/13 0600  BP: 116/54  Pulse: 90  Temp:   Resp: 17    Intake/Output Summary (Last 24 hours) at 05/31/13 0811 Last data filed at 05/31/13 0600  Gross per 24 hour  Intake 1276.67 ml  Output   3250 ml  Net -1973.33 ml   Filed Weights   06/01/2013 1400 05/31/13 0500  Weight: 101.1 kg (222 lb 14.2 oz) 103 kg (227 lb 1.2 oz)    Exam:   General:  alert  Cardiovascular: s1,s2 rrr  Respiratory: few crackle sin LL, no wheezing   Abdomen: soft, nt, nd    Musculoskeletal: edema chronic, non pitting    Data Reviewed: Basic Metabolic Panel:  Recent Labs Lab 06/05/2013 1022 05/31/13 0409  NA 138 135*  K 3.2* 3.6*  CL 86* 85*  CO2 44* 41*  GLUCOSE 111* 146*  BUN 7 8  CREATININE 0.30* 0.26*  CALCIUM 9.6 9.1   Liver Function Tests:  Recent Labs Lab 05/27/2013 1022  AST 27  ALT 24  ALKPHOS 105  BILITOT 0.4  PROT 7.4  ALBUMIN 3.6   No results found for this basename: LIPASE, AMYLASE,  in the last 168 hours No results found for this basename: AMMONIA,  in the last 168 hours CBC:  Recent Labs Lab 06/11/2013 1022 05/31/13 0409  WBC 8.3 6.9  NEUTROABS 6.6  --   HGB 12.7 12.0  HCT 37.5 34.7*  MCV 91.9 89.9  PLT 145* 147*   Cardiac Enzymes:  Recent Labs Lab 06/15/2013 1022  TROPONINI <0.30   BNP (last 3 results)  Recent Labs  03/26/13 0534 06/07/2013 1022  PROBNP 85.2 103.2   CBG: No results found for this basename: GLUCAP,  in the last 168 hours  Recent Results (from the past 240 hour(s))  MRSA PCR SCREENING     Status: None   Collection Time    06/08/2013  1:50 PM      Result Value Range Status   MRSA by PCR NEGATIVE  NEGATIVE Final   Comment:            The GeneXpert MRSA Assay (FDA  approved for NASAL specimens     only), is one component of a     comprehensive MRSA colonization     surveillance program. It is not     intended to diagnose MRSA     infection nor to guide or     monitor treatment for     MRSA infections.     Studies: Dg Chest Portable 1 View  06/16/2013   CLINICAL DATA:  Cough and congestion for 4 days, history COPD, asthma, hypertension, CHF  EXAM: PORTABLE CHEST - 1 VIEW  COMPARISON:  Portable exam 1006 hr compared to 03/26/2013  FINDINGS: Very low lung volumes.  Right apex obscured by patient's chin.  Normal heart size and mediastinal contours.  Bibasilar atelectasis.  No definite acute infiltrate or pleural effusion.  No gross pneumothorax or acute osseous findings.  IMPRESSION:  Low lung volumes with bibasilar atelectasis.   Electronically Signed   By: Lavonia Dana M.D.   On: 06/04/2013 10:15    Scheduled Meds: . albuterol  2.5 mg Nebulization TID  . baclofen  10 mg Oral BID  . carvedilol  6.25 mg Oral BID  . docusate sodium  100 mg Oral QHS  . enoxaparin (LOVENOX) injection  40 mg Subcutaneous Q24H  . fluticasone  2 spray Each Nare Daily  . latanoprost  1 drop Both Eyes QHS  . levofloxacin (LEVAQUIN) IV  750 mg Intravenous Q24H  . loratadine  10 mg Oral Daily  . LORazepam  1 mg Oral BID  . methylPREDNISolone (SOLU-MEDROL) injection  60 mg Intravenous Q6H  . pantoprazole  40 mg Oral Daily  . polyethylene glycol  17 g Oral Daily  . potassium chloride SA  20 mEq Oral Daily  . saccharomyces boulardii  250 mg Oral Daily  . simvastatin  20 mg Oral QHS  . sodium chloride  3 mL Intravenous Q12H  . tiotropium  18 mcg Inhalation Daily   Continuous Infusions: . 0.9 % NaCl with KCl 20 mEq / L 50 mL/hr at 05/31/13 4076    Principal Problem:   Respiratory failure Active Problems:   COPD (chronic obstructive pulmonary disease) with acute bronchitis   Muscular dystrophy   Chronic respiratory failure with hypercapnia    Time spent: >35 minutes     Kinnie Feil  [facility] Pager [phone]. If 7PM-7AM, please contact night-coverage at [email or web address], password [facility] 05/31/2013, 8:11 AM  LOS: 1 day

## 2013-06-01 DIAGNOSIS — E873 Alkalosis: Secondary | ICD-10-CM

## 2013-06-01 LAB — BASIC METABOLIC PANEL
BUN: 10 mg/dL (ref 6–23)
CALCIUM: 9 mg/dL (ref 8.4–10.5)
CO2: 41 mEq/L (ref 19–32)
Chloride: 85 mEq/L — ABNORMAL LOW (ref 96–112)
Creatinine, Ser: 0.23 mg/dL — ABNORMAL LOW (ref 0.50–1.10)
GFR calc Af Amer: >90 mL/min (ref >90)
Glucose, Bld: 145 mg/dL — ABNORMAL HIGH (ref 70–99)
Potassium: 4.2 mEq/L (ref 3.7–5.3)
SODIUM: 133 meq/L — AB (ref 137–147)

## 2013-06-01 MED ORDER — GUAIFENESIN ER 600 MG PO TB12
1200.0000 mg | ORAL_TABLET | Freq: Two times a day (BID) | ORAL | Status: DC
  Administered 2013-06-01 (×2): 1200 mg via ORAL
  Filled 2013-06-01 (×4): qty 2

## 2013-06-01 MED ORDER — CHLORHEXIDINE GLUCONATE 0.12 % MT SOLN
15.0000 mL | Freq: Two times a day (BID) | OROMUCOSAL | Status: DC
  Administered 2013-06-01 – 2013-06-09 (×15): 15 mL via OROMUCOSAL
  Filled 2013-06-01 (×19): qty 15

## 2013-06-01 MED ORDER — ACETAZOLAMIDE 250 MG PO TABS
250.0000 mg | ORAL_TABLET | Freq: Every day | ORAL | Status: DC
  Administered 2013-06-01: 250 mg via ORAL
  Filled 2013-06-01 (×4): qty 1

## 2013-06-01 MED ORDER — FUROSEMIDE 10 MG/ML IJ SOLN
40.0000 mg | Freq: Every day | INTRAMUSCULAR | Status: DC
  Administered 2013-06-01 – 2013-06-06 (×6): 40 mg via INTRAVENOUS
  Filled 2013-06-01 (×7): qty 4

## 2013-06-01 MED ORDER — BUDESONIDE-FORMOTEROL FUMARATE 160-4.5 MCG/ACT IN AERO
2.0000 | INHALATION_SPRAY | Freq: Two times a day (BID) | RESPIRATORY_TRACT | Status: DC
  Administered 2013-06-01 (×2): 2 via RESPIRATORY_TRACT
  Filled 2013-06-01: qty 6

## 2013-06-01 MED ORDER — BIOTENE DRY MOUTH MT LIQD
15.0000 mL | Freq: Two times a day (BID) | OROMUCOSAL | Status: DC
  Administered 2013-06-02 – 2013-06-09 (×16): 15 mL via OROMUCOSAL

## 2013-06-01 NOTE — Progress Notes (Signed)
[facility] PROGRESS NOTE  CHARNELE SEMPLE NIO:[id number] DOB: 07/25/1945 DOA: 06-05-13 PCP: Delphina Cahill, MD  Assessment/Plan: Principal Problem:  Respiratory failure  Active Problems:  COPD (chronic obstructive pulmonary disease) with acute bronchitis  Muscular dystrophy  Chronic respiratory failure with hypercapnia   68 y.o. female with PMH of HTN, HPL, CHF, COPD, muscular dystrophy, chronic respiratory failure on home oxygen presented with SOB admitted with respiratory failure/COPD/CHF  1. COPD exacerbation; CXR: no clear infiltrate;  -cont IV steroids, atx, cont bronchodilator, oxygen, prn BiPAP with inhalers to decrease CO2  2. Acute on chronic respiratory failure due to COPD: ABG: 7.39-71-71-42  -cont as above, BiPAP prn, ask pulmonology eval   3. Chronic CHF; Echo (2014): LVH, likely diastolic dysfunction; alkalosis/likely over diuresed on top of CO2 retention/ compensation for resp acidosis on admission;  -I/O neg; cont lasix; daily weight, I/O;  Try acetazolamide with persistent alkalosis 1/16  4. Hypo K; replace lytes;   5. HTN cont home regimen   6. Muscular dystropy non ambulatory; cont supportive care     Code Status: full Family Communication: d/w patient, d/w DPOA yesterday (indicate person spoken with, relationship, and if by phone, the number) Disposition Plan: AL in 2-3 days    Consultants:  None   Procedures:  None   Antibiotics:  Levofloxacin 1/14<<< (indicate start date, and stop date if known)  HPI/Subjective: alert  Objective: Filed Vitals:   06/01/13 0645  BP: 126/64  Pulse: 99  Temp:   Resp: 21    Intake/Output Summary (Last 24 hours) at 06/01/13 0734 Last data filed at 06/01/13 0600  Gross per 24 hour  Intake   1630 ml  Output   3000 ml  Net  -1370 ml   Filed Weights   Jun 05, 2013 1400 05/31/13 0500 06/01/13 0500  Weight: 101.1 kg (222 lb 14.2 oz) 103 kg (227 lb 1.2 oz) 102.1 kg (225 lb 1.4 oz)    Exam:   General:   alert  Cardiovascular: s1,s2 rrr  Respiratory: few crackle sin LL, no wheezing   Abdomen: soft, nt, nd   Musculoskeletal: edema chronic, non pitting    Data Reviewed: Basic Metabolic Panel:  Recent Labs Lab Jun 05, 2013 1022 05/31/13 0409 06/01/13 0427  NA 138 135* 133*  K 3.2* 3.6* 4.2  CL 86* 85* 85*  CO2 44* 41* 41*  GLUCOSE 111* 146* 145*  BUN 7 8 10   CREATININE 0.30* 0.26* 0.23*  CALCIUM 9.6 9.1 9.0   Liver Function Tests:  Recent Labs Lab June 05, 2013 1022  AST 27  ALT 24  ALKPHOS 105  BILITOT 0.4  PROT 7.4  ALBUMIN 3.6   No results found for this basename: LIPASE, AMYLASE,  in the last 168 hours No results found for this basename: AMMONIA,  in the last 168 hours CBC:  Recent Labs Lab 05-Jun-2013 1022 05/31/13 0409  WBC 8.3 6.9  NEUTROABS 6.6  --   HGB 12.7 12.0  HCT 37.5 34.7*  MCV 91.9 89.9  PLT 145* 147*   Cardiac Enzymes:  Recent Labs Lab 06-05-2013 1022  TROPONINI <0.30   BNP (last 3 results)  Recent Labs  03/26/13 0534 Jun 05, 2013 1022  PROBNP 85.2 103.2   CBG: No results found for this basename: GLUCAP,  in the last 168 hours  Recent Results (from the past 240 hour(s))  MRSA PCR SCREENING     Status: None   Collection Time    06-05-13  1:50 PM      Result Value Range Status  MRSA by PCR NEGATIVE  NEGATIVE Final   Comment:            The GeneXpert MRSA Assay (FDA     approved for NASAL specimens     only), is one component of a     comprehensive MRSA colonization     surveillance program. It is not     intended to diagnose MRSA     infection nor to guide or     monitor treatment for     MRSA infections.     Studies: Dg Chest Portable 1 View  05/29/2013   CLINICAL DATA:  Cough and congestion for 4 days, history COPD, asthma, hypertension, CHF  EXAM: PORTABLE CHEST - 1 VIEW  COMPARISON:  Portable exam 1006 hr compared to 03/26/2013  FINDINGS: Very low lung volumes.  Right apex obscured by patient's chin.  Normal heart size and  mediastinal contours.  Bibasilar atelectasis.  No definite acute infiltrate or pleural effusion.  No gross pneumothorax or acute osseous findings.  IMPRESSION: Low lung volumes with bibasilar atelectasis.   Electronically Signed   By: Lavonia Dana M.D.   On: 05/21/2013 10:15    Scheduled Meds: . albuterol  2.5 mg Nebulization TID  . baclofen  10 mg Oral BID  . carvedilol  6.25 mg Oral BID  . docusate sodium  100 mg Oral QHS  . enoxaparin (LOVENOX) injection  40 mg Subcutaneous Q24H  . fluticasone  2 spray Each Nare Daily  . furosemide  20 mg Oral Daily  . latanoprost  1 drop Both Eyes QHS  . levofloxacin (LEVAQUIN) IV  750 mg Intravenous Q24H  . loratadine  10 mg Oral Daily  . LORazepam  1 mg Oral BID  . methylPREDNISolone (SOLU-MEDROL) injection  40 mg Intravenous Q6H  . pantoprazole  40 mg Oral Daily  . polyethylene glycol  17 g Oral Daily  . potassium chloride SA  20 mEq Oral Daily  . saccharomyces boulardii  250 mg Oral Daily  . simvastatin  20 mg Oral QHS  . sodium chloride  3 mL Intravenous Q12H  . tiotropium  18 mcg Inhalation Daily   Continuous Infusions: . 0.9 % NaCl with KCl 20 mEq / L 50 mL/hr at 06/01/13 0600    Principal Problem:   Respiratory failure Active Problems:   COPD (chronic obstructive pulmonary disease) with acute bronchitis   Muscular dystrophy   Chronic respiratory failure with hypercapnia    Time spent: >35 minutes     Karen Collier  [facility] Pager [phone]. If 7PM-7AM, please contact night-coverage at [email or web address], password [facility] 06/01/2013, 7:34 AM  LOS: 2 days

## 2013-06-01 NOTE — Clinical Social Work Note (Signed)
CSW updated [facility] on pt. Per Butch Penny, okay for return on Sunday if stable. Spoke with pharmacy to notify them that facility will require sufficient supply of new medications through Monday. They are agreeable. MD aware.  Benay Pike, [place]

## 2013-06-01 NOTE — Consult Note (Signed)
Consult requested by:Dr. Daleen Bo Consult requested for respiratory:  HPI: This is a 68 year old who was admitted to the hospital with COPD exacerbation and acute respiratory failure. Her history is positive for COPD chronic respiratory failure on home oxygen and muscular dystrophy. She came to the emergency department with increasing problems with cough congestion fever and chills. She was hypoxic on oxygen was started on BiPAP. She says she still short of breath now and if she does anything even eating she becomes more short of breath.  Past Medical History  Diagnosis Date  . Muscular dystrophy   . COPD (chronic obstructive pulmonary disease)   . Asthma   . Hypoxemia   . CHF (congestive heart failure)   . Chronic pain   . Hypertension   . Glaucoma   . Muscle weakness   . Chronic respiratory failure with hypercapnia     And hypoxia  . Atelectasis 11/21/2011  . Lung nodule 11/21/2011  . Thyroid nodule 11/21/2011  . Compensated alkalosis 11/21/2011  . Pneumonia 11/21/2011  . Hyponatremia Recurrent    Normalizes with normal saline  . GERD (gastroesophageal reflux disease)   . Acute diastolic CHF (congestive heart failure) 03/28/2013     Family History  Problem Relation Age of Onset  . Colon cancer Neg Hx      History   Social History  . Marital Status: Single    Spouse Name: N/A    Number of Children: N/A  . Years of Education: N/A   Social History Main Topics  . Smoking status: Never Smoker   . Smokeless tobacco: None  . Alcohol Use: No  . Drug Use: No  . Sexual Activity: Not Currently    Birth Control/ Protection: Post-menopausal   Other Topics Concern  . None   Social History Narrative  . None     ROS: She denies any chest pain. She has not had a definite fever but has had chills. She has not had hemoptysis. No nausea vomiting or diarrhea.    Objective: Vital signs in last 24 hours: Temp:  [97.9 F (36.6 C)-98.6 F (37 C)] 98.5 F (36.9 C) (01/16 0400) Pulse  Rate:  [94-111] 99 (01/16 0645) Resp:  [15-33] 21 (01/16 0645) BP: (98-150)/(45-95) 126/64 mmHg (01/16 0645) SpO2:  [89 %-98 %] 93 % (01/16 0709) Weight:  [102.1 kg (225 lb 1.4 oz)] 102.1 kg (225 lb 1.4 oz) (01/16 0500) Weight change: 1 kg (2 lb 3.3 oz) Last BM Date: 05/29/13  Intake/Output from previous day: 01/15 0701 - 01/16 0700 In: 1630 [P.O.:480; I.V.:1150] Out: 3000 [Urine:3000]  PHYSICAL EXAM She is awake and alert. She looks somewhat dyspneic. Her pupils are reactive extraocular muscles are intact. Her nose and throat are clear and her mucous membranes are moist. Her neck is supple without masses. Her heart is regular she has a tachycardia but no gallop. Her chest shows decreased breath sounds and prolonged expiration. Her abdomen is soft obese with no tenderness. Extremities show that she has some edema on the left leg which apparently is chronic. Neurologically her strength is decreased but overall intact  Lab Results: Basic Metabolic Panel:  Recent Labs  05/31/13 0409 06/01/13 0427  NA 135* 133*  K 3.6* 4.2  CL 85* 85*  CO2 41* 41*  GLUCOSE 146* 145*  BUN 8 10  CREATININE 0.26* 0.23*  CALCIUM 9.1 9.0   Liver Function Tests:  Recent Labs  05/19/2013 1022  AST 27  ALT 24  ALKPHOS 105  BILITOT 0.4  PROT 7.4  ALBUMIN 3.6   No results found for this basename: LIPASE, AMYLASE,  in the last 72 hours No results found for this basename: AMMONIA,  in the last 72 hours CBC:  Recent Labs  05/31/2013 1022 05/31/13 0409  WBC 8.3 6.9  NEUTROABS 6.6  --   HGB 12.7 12.0  HCT 37.5 34.7*  MCV 91.9 89.9  PLT 145* 147*   Cardiac Enzymes:  Recent Labs  05/21/2013 1022  TROPONINI <0.30   BNP:  Recent Labs  06/11/2013 1022  PROBNP 103.2   D-Dimer: No results found for this basename: DDIMER,  in the last 72 hours CBG: No results found for this basename: GLUCAP,  in the last 72 hours Hemoglobin A1C: No results found for this basename: HGBA1C,  in the last 72  hours Fasting Lipid Panel: No results found for this basename: CHOL, HDL, LDLCALC, TRIG, CHOLHDL, LDLDIRECT,  in the last 72 hours Thyroid Function Tests: No results found for this basename: TSH, T4TOTAL, FREET4, T3FREE, THYROIDAB,  in the last 72 hours Anemia Panel: No results found for this basename: VITAMINB12, FOLATE, FERRITIN, TIBC, IRON, RETICCTPCT,  in the last 72 hours Coagulation: No results found for this basename: LABPROT, INR,  in the last 72 hours Urine Drug Screen: Drugs of Abuse  No results found for this basename: labopia, cocainscrnur, labbenz, amphetmu, thcu, labbarb    Alcohol Level: No results found for this basename: ETH,  in the last 72 hours Urinalysis:  Recent Labs  05/23/2013 1045  COLORURINE YELLOW  LABSPEC <1.005*  PHURINE 6.0  GLUCOSEU NEGATIVE  HGBUR NEGATIVE  BILIRUBINUR NEGATIVE  KETONESUR NEGATIVE  PROTEINUR NEGATIVE  UROBILINOGEN 0.2  [place]. Labs:   ABGS:  Recent Labs  06/05/2013 1015  PHART 7.397  PO2ART 65.6*  TCO2 38.4  HCO3 42.8*     MICROBIOLOGY: Recent Results (from the past 240 hour(s))  MRSA PCR SCREENING     Status: None   Collection Time    05/26/2013  1:50 PM      Result Value Range Status   MRSA by PCR NEGATIVE  NEGATIVE Final   Comment:            The GeneXpert MRSA Assay (FDA     approved for NASAL specimens     only), is one component of a     comprehensive MRSA colonization     surveillance program. It is not     intended to diagnose MRSA     infection nor to guide or     monitor treatment for     MRSA infections.    Studies/Results: Dg Chest Portable 1 View  05/31/2013   CLINICAL DATA:  Cough and congestion for 4 days, history COPD, asthma, hypertension, CHF  EXAM: PORTABLE CHEST - 1 VIEW  COMPARISON:  Portable exam 1006 hr compared to 03/26/2013  FINDINGS: Very low lung volumes.  Right apex obscured by patient's chin.  Normal heart size and mediastinal contours.   Bibasilar atelectasis.  No definite acute infiltrate or pleural effusion.  No gross pneumothorax or acute osseous findings.  IMPRESSION: Low lung volumes with bibasilar atelectasis.   Electronically Signed   By: Lavonia Dana M.D.   On: 06/14/2013 10:15    Medications:  Prior to Admission:  Prescriptions prior to admission  Medication Sig Dispense Refill  . acetaminophen (TYLENOL) 500 MG tablet Take 500 mg by mouth every 6 (six) hours as needed for mild pain.      Marland Kitchen  albuterol (PROVENTIL) (2.5 MG/3ML) 0.083% nebulizer solution Take 2.5 mg by nebulization 3 (three) times daily as needed for shortness of breath.      . baclofen (LIORESAL) 10 MG tablet Take 10 mg by mouth 2 (two) times daily.      . Calcium Carbonate-Vitamin D (CALCIUM 600 + D PO) Take 1 tablet by mouth daily.      . carvedilol (COREG) 6.25 MG tablet Take 1 tablet (6.25 mg total) by mouth 2 (two) times daily.  60 tablet  2  . docusate sodium (COLACE) 100 MG capsule Take 100 mg by mouth at bedtime.       . fexofenadine (ALLEGRA) 180 MG tablet Take 180 mg by mouth daily.      . fluticasone (FLONASE) 50 MCG/ACT nasal spray Place 2 sprays into both nostrils daily.      . furosemide (LASIX) 20 MG tablet Take 2 tablets (40 mg total) by mouth 2 (two) times daily.  30 tablet  0  . latanoprost (XALATAN) 0.005 % ophthalmic solution Place 1 drop into both eyes at bedtime.      Marland Kitchen liver oil-zinc oxide (DESITIN) 40 % ointment Apply 1 application topically as needed for irritation (apply to affected areas as needed for irritation).      . LORazepam (ATIVAN) 1 MG tablet Take 1 tablet (1 mg total) by mouth 2 (two) times daily.      . meclizine (ANTIVERT) 25 MG tablet Take 25 mg by mouth daily as needed for dizziness.      . metolazone (ZAROXOLYN) 2.5 MG tablet Take 1 tablet by mouth as directed. Take 30 minutes before taking Lasix on Monday, Wednesday, and Friday.      . Multiple Vitamin (DAILY VITE PO) Take 1 tablet by mouth daily.      . naproxen  (NAPROSYN) 500 MG tablet Take 500 mg by mouth 2 (two) times daily with a meal.      . omeprazole (PRILOSEC) 20 MG capsule Take 1 capsule (20 mg total) by mouth daily.  30 capsule  3  . oxymetazoline (NASAL RELIEF) 0.05 % nasal spray Place 2 sprays into both nostrils every 6 (six) hours as needed for congestion.      Vladimir Faster Glycol-Propyl Glycol 0.4-0.3 % SOLN Apply 1 drop to eye as needed (instill 1-2 drops in each eye as needed for dry eyes).      . polyethylene glycol powder (GLYCOLAX/MIRALAX) powder Take 17 g by mouth daily. As needed for a bowel movement daily.  255 g  11  . potassium chloride SA (K-DUR,KLOR-CON) 20 MEQ tablet Take 20 mEq by mouth 2 (two) times daily.      . Probiotic Product (ALIGN) 4 MG CAPS Take 1 capsule by mouth daily.      . simethicone (MYLICON) 80 MG chewable tablet Chew 80 mg by mouth every 6 (six) hours as needed for flatulence.      . simvastatin (ZOCOR) 20 MG tablet Take 20 mg by mouth at bedtime.      Marland Kitchen SPIRIVA HANDIHALER 18 MCG inhalation capsule Place 18 mcg into inhaler and inhale daily.        Scheduled: . acetaZOLAMIDE  250 mg Oral Daily  . albuterol  2.5 mg Nebulization TID  . baclofen  10 mg Oral BID  . budesonide-formoterol  2 puff Inhalation BID  . carvedilol  6.25 mg Oral BID  . docusate sodium  100 mg Oral QHS  . enoxaparin (LOVENOX) injection  40 mg Subcutaneous  Q24H  . fluticasone  2 spray Each Nare Daily  . furosemide  40 mg Intravenous Daily  . guaiFENesin  1,200 mg Oral BID  . latanoprost  1 drop Both Eyes QHS  . levofloxacin (LEVAQUIN) IV  750 mg Intravenous Q24H  . loratadine  10 mg Oral Daily  . LORazepam  1 mg Oral BID  . methylPREDNISolone (SOLU-MEDROL) injection  40 mg Intravenous Q6H  . pantoprazole  40 mg Oral Daily  . polyethylene glycol  17 g Oral Daily  . potassium chloride SA  20 mEq Oral Daily  . saccharomyces boulardii  250 mg Oral Daily  . simvastatin  20 mg Oral QHS  . sodium chloride  3 mL Intravenous Q12H  .  tiotropium  18 mcg Inhalation Daily   Continuous:  LYY:[id number], acetaminophen, albuterol, meclizine, ondansetron (ZOFRAN) IV, ondansetron, simethicone  Assesment: She was admitted with respiratory failure. She has COPD. Her situation is complicated by the fact she has muscular dystrophy. She says she feels like she has congestion but she's not able to cough and bring up. She is on appropriate treatment including IV steroids inhaled bronchodilators and IV antibiotics. I agree I think she has chronic elevation of PCO2 and has chronic hypercapnic respiratory failure. Principal Problem:   Respiratory failure Active Problems:   COPD (chronic obstructive pulmonary disease) with acute bronchitis   Muscular dystrophy   Chronic respiratory failure with hypercapnia    Plan: I am going to add Symbicort, flutter valve and Mucinex. Otherwise I think she's on appropriate care. I will be out of town the next 2 days return on the 19th    LOS: 2 days   Keajah Killough L 06/01/2013, 8:44 AM

## 2013-06-01 NOTE — Care Management Note (Addendum)
    Page 1 of 2   06/11/2013     12:39:54 PM   CARE MANAGEMENT NOTE 06/11/2013  Patient:  Karen Collier, Karen Collier   Account Number:  [id number]  Date Initiated:  06/01/2013  Documentation initiated by:  Theophilus Kinds  Subjective/Objective Assessment:   Pt admitted from [facility] ALF with COPD exacerbation. Pt will return to facility at discharge.     Action/Plan:   CSW to arrange discharge to facility when medically stable.   Anticipated DC Date:     Anticipated DC Plan:  ASSISTED LIVING / REST HOME  In-house referral  Clinical Social Worker      DC Planning Services  CM consult      Choice offered to / List presented to:             Status of service:  Completed, signed off Medicare Important Message given?   (If response is "NO", the following Medicare IM given date fields will be blank) Date Medicare IM given:   Date Additional Medicare IM given:    Discharge Disposition:    Per UR Regulation:  Reviewed for med. 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 Cont solumedrol tapered. Last day of levaquin. SNF [facility]) pending Disposition Plan:  SNF [facility]) ADD: +1-3 for [facility] auth pending and Bed options. [address] RN, BSN, [place], [place] [phone] Unit) [phone] 06/07/2013   06/01/13 [place], Therapist, sports BSN CM

## 2013-06-02 ENCOUNTER — Inpatient Hospital Stay (HOSPITAL_COMMUNITY): Payer: Medicare Other

## 2013-06-02 ENCOUNTER — Inpatient Hospital Stay (HOSPITAL_COMMUNITY): Payer: Medicare Other | Admitting: Anesthesiology

## 2013-06-02 ENCOUNTER — Encounter (HOSPITAL_COMMUNITY): Payer: Medicare Other | Admitting: Anesthesiology

## 2013-06-02 DIAGNOSIS — J9620 Acute and chronic respiratory failure, unspecified whether with hypoxia or hypercapnia: Secondary | ICD-10-CM

## 2013-06-02 DIAGNOSIS — J984 Other disorders of lung: Secondary | ICD-10-CM | POA: Diagnosis present

## 2013-06-02 DIAGNOSIS — J9600 Acute respiratory failure, unspecified whether with hypoxia or hypercapnia: Secondary | ICD-10-CM

## 2013-06-02 DIAGNOSIS — J986 Disorders of diaphragm: Secondary | ICD-10-CM | POA: Diagnosis present

## 2013-06-02 DIAGNOSIS — J9610 Chronic respiratory failure, unspecified whether with hypoxia or hypercapnia: Secondary | ICD-10-CM

## 2013-06-02 DIAGNOSIS — G7100 Muscular dystrophy, unspecified: Secondary | ICD-10-CM

## 2013-06-02 DIAGNOSIS — J440 Chronic obstructive pulmonary disease with acute lower respiratory infection: Principal | ICD-10-CM

## 2013-06-02 LAB — POCT I-STAT 3, ART BLOOD GAS (G3+)
Acid-Base Excess: 9 mmol/L — ABNORMAL HIGH (ref 0.0–2.0)
Bicarbonate: 37.1 mEq/L — ABNORMAL HIGH (ref 20.0–24.0)
O2 Saturation: 95 %
PH ART: 7.336 — AB (ref 7.350–7.450)
Patient temperature: 100.1
TCO2: 39 mmol/L (ref 0–100)
pCO2 arterial: 70 mmHg (ref 35.0–45.0)
pO2, Arterial: 86 mmHg (ref 80.0–100.0)

## 2013-06-02 LAB — MRSA PCR SCREENING: MRSA BY PCR: NEGATIVE

## 2013-06-02 LAB — BASIC METABOLIC PANEL
BUN: 15 mg/dL (ref 6–23)
CO2: 38 mEq/L — ABNORMAL HIGH (ref 19–32)
Calcium: 9.2 mg/dL (ref 8.4–10.5)
Chloride: 84 mEq/L — ABNORMAL LOW (ref 96–112)
Creatinine, Ser: 0.28 mg/dL — ABNORMAL LOW (ref 0.50–1.10)
GFR calc Af Amer: >90 mL/min (ref >90)
GFR calc non Af Amer: >90 mL/min (ref >90)
GLUCOSE: 166 mg/dL — AB (ref 70–99)
POTASSIUM: 4.2 meq/L (ref 3.7–5.3)
Sodium: 129 mEq/L — ABNORMAL LOW (ref 137–147)

## 2013-06-02 LAB — BLOOD GAS, ARTERIAL
ACID-BASE EXCESS: 10 mmol/L — AB (ref 0.0–2.0)
BICARBONATE: 38.3 meq/L — AB (ref 20.0–24.0)
Drawn by: 38235
FIO2: 0.36 %
O2 Saturation: 94.4 %
PATIENT TEMPERATURE: 37
PH ART: 7.179 — AB (ref 7.350–7.450)
TCO2: 35.8 mmol/L (ref 0–100)
pCO2 arterial: 107 mmHg (ref 35.0–45.0)
pO2, Arterial: 79.1 mmHg — ABNORMAL LOW (ref 80.0–100.0)

## 2013-06-02 LAB — URINALYSIS, ROUTINE W REFLEX MICROSCOPIC
Bilirubin Urine: NEGATIVE
Glucose, UA: NEGATIVE mg/dL
Ketones, ur: NEGATIVE mg/dL
LEUKOCYTES UA: NEGATIVE
Nitrite: NEGATIVE
PROTEIN: NEGATIVE mg/dL
Specific Gravity, Urine: 1.011 (ref 1.005–1.030)
Urobilinogen, UA: 0.2 mg/dL (ref 0.0–1.0)
pH: 8 (ref 5.0–8.0)

## 2013-06-02 LAB — URINE MICROSCOPIC-ADD ON

## 2013-06-02 LAB — GLUCOSE, CAPILLARY
GLUCOSE-CAPILLARY: 148 mg/dL — AB (ref 70–99)
GLUCOSE-CAPILLARY: 160 mg/dL — AB (ref 70–99)
Glucose-Capillary: 172 mg/dL — ABNORMAL HIGH (ref 70–99)

## 2013-06-02 MED ORDER — ETOMIDATE 2 MG/ML IV SOLN
INTRAVENOUS | Status: AC
  Filled 2013-06-02: qty 20

## 2013-06-02 MED ORDER — VITAL HIGH PROTEIN PO LIQD
1000.0000 mL | ORAL | Status: DC
  Administered 2013-06-02 – 2013-06-03 (×2): 1000 mL
  Filled 2013-06-02 (×6): qty 1000

## 2013-06-02 MED ORDER — GUAIFENESIN 100 MG/5ML PO SYRP
400.0000 mg | ORAL_SOLUTION | ORAL | Status: DC
  Administered 2013-06-02: 400 mg via ORAL
  Filled 2013-06-02 (×5): qty 20

## 2013-06-02 MED ORDER — METHYLPREDNISOLONE SODIUM SUCC 40 MG IJ SOLR
40.0000 mg | Freq: Four times a day (QID) | INTRAMUSCULAR | Status: DC
  Administered 2013-06-02 – 2013-06-05 (×11): 40 mg via INTRAVENOUS
  Filled 2013-06-02 (×15): qty 1

## 2013-06-02 MED ORDER — PRO-STAT SUGAR FREE PO LIQD
30.0000 mL | Freq: Every day | ORAL | Status: DC
  Administered 2013-06-02 – 2013-06-04 (×3): 30 mL
  Filled 2013-06-02 (×4): qty 30

## 2013-06-02 MED ORDER — PANTOPRAZOLE SODIUM 40 MG IV SOLR
40.0000 mg | INTRAVENOUS | Status: DC
  Administered 2013-06-02 – 2013-06-04 (×3): 40 mg via INTRAVENOUS
  Filled 2013-06-02 (×4): qty 40

## 2013-06-02 MED ORDER — PROPOFOL 10 MG/ML IV EMUL
5.0000 ug/kg/min | INTRAVENOUS | Status: DC
  Administered 2013-06-02: 40 ug/kg/min via INTRAVENOUS
  Administered 2013-06-03: 10 ug/kg/min via INTRAVENOUS
  Administered 2013-06-03: 15 ug/kg/min via INTRAVENOUS
  Filled 2013-06-02 (×3): qty 100

## 2013-06-02 MED ORDER — PROPOFOL 10 MG/ML IV EMUL
INTRAVENOUS | Status: AC
  Administered 2013-06-02: 1000 mg
  Filled 2013-06-02: qty 100

## 2013-06-02 MED ORDER — SODIUM CHLORIDE 0.9 % IV SOLN
INTRAVENOUS | Status: DC
  Administered 2013-06-02: 10 mL/h via INTRAVENOUS

## 2013-06-02 MED ORDER — SUCCINYLCHOLINE CHLORIDE 20 MG/ML IJ SOLN
INTRAMUSCULAR | Status: AC
  Filled 2013-06-02: qty 1

## 2013-06-02 MED ORDER — ROCURONIUM BROMIDE 50 MG/5ML IV SOLN
INTRAVENOUS | Status: AC
  Filled 2013-06-02: qty 2

## 2013-06-02 MED ORDER — PROPOFOL 10 MG/ML IV EMUL
INTRAVENOUS | Status: AC
  Filled 2013-06-02: qty 20

## 2013-06-02 MED ORDER — POTASSIUM CHLORIDE 20 MEQ/15ML (10%) PO LIQD
20.0000 meq | Freq: Every day | ORAL | Status: DC
  Administered 2013-06-03 – 2013-06-09 (×7): 20 meq via ORAL
  Filled 2013-06-02 (×8): qty 15

## 2013-06-02 MED ORDER — INSULIN ASPART 100 UNIT/ML ~~LOC~~ SOLN
0.0000 [IU] | SUBCUTANEOUS | Status: DC
  Administered 2013-06-02 – 2013-06-03 (×3): 2 [IU] via SUBCUTANEOUS
  Administered 2013-06-03: 3 [IU] via SUBCUTANEOUS
  Administered 2013-06-03 (×2): 1 [IU] via SUBCUTANEOUS
  Administered 2013-06-03 – 2013-06-04 (×3): 2 [IU] via SUBCUTANEOUS
  Administered 2013-06-04: 1 [IU] via SUBCUTANEOUS
  Administered 2013-06-04 – 2013-06-05 (×6): 2 [IU] via SUBCUTANEOUS
  Administered 2013-06-05: 3 [IU] via SUBCUTANEOUS
  Administered 2013-06-05: 1 [IU] via SUBCUTANEOUS
  Administered 2013-06-05 – 2013-06-06 (×2): 2 [IU] via SUBCUTANEOUS

## 2013-06-02 MED ORDER — FENTANYL CITRATE 0.05 MG/ML IJ SOLN: 25.0000 ug | INTRAMUSCULAR | Status: DC | PRN

## 2013-06-02 MED ORDER — VITAL AF 1.2 CAL PO LIQD
1000.0000 mL | ORAL | Status: DC
  Filled 2013-06-02 (×2): qty 1000

## 2013-06-02 MED ORDER — GUAIFENESIN 100 MG/5ML PO SYRP
400.0000 mg | ORAL_SOLUTION | ORAL | Status: DC
  Administered 2013-06-02 – 2013-06-09 (×32): 400 mg via ORAL
  Filled 2013-06-02 (×53): qty 20

## 2013-06-02 MED ORDER — PROPOFOL 10 MG/ML IV EMUL
INTRAVENOUS | Status: AC
  Filled 2013-06-02: qty 100

## 2013-06-02 MED ORDER — SODIUM CHLORIDE 0.9 % IV SOLN: INTRAVENOUS | Status: DC

## 2013-06-02 MED ORDER — BISACODYL 10 MG RE SUPP: 10.0000 mg | Freq: Every day | RECTAL | Status: DC | PRN

## 2013-06-02 MED ORDER — GUAIFENESIN 100 MG/5ML PO SYRP
400.0000 mg | ORAL_SOLUTION | ORAL | Status: DC | PRN
  Filled 2013-06-02: qty 20

## 2013-06-02 MED ORDER — DOCUSATE SODIUM 50 MG/5ML PO LIQD
100.0000 mg | Freq: Every day | ORAL | Status: DC
  Administered 2013-06-02 – 2013-06-09 (×7): 100 mg via ORAL
  Filled 2013-06-02 (×9): qty 10

## 2013-06-02 MED ORDER — IPRATROPIUM-ALBUTEROL 0.5-2.5 (3) MG/3ML IN SOLN
3.0000 mL | Freq: Four times a day (QID) | RESPIRATORY_TRACT | Status: DC
  Administered 2013-06-02 – 2013-06-10 (×30): 3 mL via RESPIRATORY_TRACT
  Filled 2013-06-02 (×30): qty 3

## 2013-06-02 MED ORDER — LIDOCAINE HCL (CARDIAC) 20 MG/ML IV SOLN
INTRAVENOUS | Status: AC
  Filled 2013-06-02: qty 5

## 2013-06-02 NOTE — Progress Notes (Signed)
[place] Mallie Mussel [facility]) updated on plan to intubate pt & wiil be coming in soon

## 2013-06-02 NOTE — Progress Notes (Signed)
[facility] PROGRESS NOTE  Karen Collier NOI:[id number] DOB: [birth year] DOA: 05/22/2013 PCP: Alonza Bogus, MD  Assessment/Plan: Principal Problem:  Respiratory failure  Active Problems:  COPD (chronic obstructive pulmonary disease) with acute bronchitis  Muscular dystrophy  Chronic respiratory failure with hypercapnia   68 y.o. female with PMH of HTN, HPL, CHF, COPD, muscular dystrophy, chronic respiratory failure on home oxygen presented with SOB admitted with respiratory failure/COPD/CHF -clinically not improving  1. COPD exacerbation; CXR: no clear infiltrate;  -not improving on BiPAP, repeat ABG: 7.17-107-79-38; need mechanical ventilation; cont IV steroids, atx, cont bronchodilator, oxygen -TF to [facility]/ICU mechanical ventilation; no pulmonology coverage at [facility] weekend; d/w Dr. Joya Gaskins at [facility] who will be accepting the patient   2. Acute on chronic respiratory failure due to COPD:   -cont as above, BiPAP prn   3. Chronic CHF; Echo (2014): LVH, likely diastolic dysfunction; alkalosis/likely over diuresed on top of CO2 retention/ compensation for resp acidosis on admission;  -I/O neg; cont lasix; daily weight, I/O;  hco3 improved on acetazolamide with persistent alkalosis (stated 1/16)  4. Hypo K; replace lytes;   5. HTN cont home regimen   6. Muscular dystropy non ambulatory; cont supportive care   D/w patient, updated Orma Render [phone]   Code Status: full Family Communication: d/w patient, d/w DPOA yesterday (indicate person spoken with, relationship, and if by phone, the number) Disposition Plan: TF to [facility]    Consultants:  None   Procedures:  None   Antibiotics:  Levofloxacin 1/14<<< (indicate start date, and stop date if known)  HPI/Subjective: alert  Objective: Filed Vitals:   06/02/13 0648  BP:   Pulse: 102  Temp:   Resp: 21    Intake/Output Summary (Last 24 hours) at 06/02/13 0734 Last data filed at 06/02/13  0600  Gross per 24 hour  Intake   1060 ml  Output   3300 ml  Net  -2240 ml   Filed Weights   05/31/13 0500 06/01/13 0500 06/02/13 0500  Weight: 103 kg (227 lb 1.2 oz) 102.1 kg (225 lb 1.4 oz) 101.6 kg (223 lb 15.8 oz)    Exam:   General:  alert  Cardiovascular: s1,s2 rrr  Respiratory: few crackle sin LL, no wheezing   Abdomen: soft, nt, nd   Musculoskeletal: edema chronic, non pitting    Data Reviewed: Basic Metabolic Panel:  Recent Labs Lab 05/26/2013 1022 05/31/13 0409 06/01/13 0427 06/02/13 0458  NA 138 135* 133* 129*  K 3.2* 3.6* 4.2 4.2  CL 86* 85* 85* 84*  CO2 44* 41* 41* 38*  GLUCOSE 111* 146* 145* 166*  BUN 7 8 10 15   CREATININE 0.30* 0.26* 0.23* 0.28*  CALCIUM 9.6 9.1 9.0 9.2   Liver Function Tests:  Recent Labs Lab 06/11/2013 1022  AST 27  ALT 24  ALKPHOS 105  BILITOT 0.4  PROT 7.4  ALBUMIN 3.6   No results found for this basename: LIPASE, AMYLASE,  in the last 168 hours No results found for this basename: AMMONIA,  in the last 168 hours CBC:  Recent Labs Lab 06/02/2013 1022 05/31/13 0409  WBC 8.3 6.9  NEUTROABS 6.6  --   HGB 12.7 12.0  HCT 37.5 34.7*  MCV 91.9 89.9  PLT 145* 147*   Cardiac Enzymes:  Recent Labs Lab 06/06/2013 1022  TROPONINI <0.30   BNP (last 3 results)  Recent Labs  03/26/13 0534 06/08/2013 1022  PROBNP 85.2 103.2   CBG: No results found for this basename: GLUCAP,  in the last 168 hours  Recent Results (from the past 240 hour(s))  MRSA PCR SCREENING     Status: None   Collection Time    05/18/2013  1:50 PM      Result Value Range Status   MRSA by PCR NEGATIVE  NEGATIVE Final   Comment:            The GeneXpert MRSA Assay (FDA     approved for NASAL specimens     only), is one component of a     comprehensive MRSA colonization     surveillance program. It is not     intended to diagnose MRSA     infection nor to guide or     monitor treatment for     MRSA infections.     Studies: No results  found.  Scheduled Meds: . acetaZOLAMIDE  250 mg Oral Daily  . albuterol  2.5 mg Nebulization TID  . antiseptic oral rinse  15 mL Mouth Rinse q12n4p  . baclofen  10 mg Oral BID  . budesonide-formoterol  2 puff Inhalation BID  . carvedilol  6.25 mg Oral BID  . chlorhexidine  15 mL Mouth Rinse BID  . docusate sodium  100 mg Oral QHS  . enoxaparin (LOVENOX) injection  40 mg Subcutaneous Q24H  . fluticasone  2 spray Each Nare Daily  . furosemide  40 mg Intravenous Daily  . guaiFENesin  1,200 mg Oral BID  . latanoprost  1 drop Both Eyes QHS  . levofloxacin (LEVAQUIN) IV  750 mg Intravenous Q24H  . loratadine  10 mg Oral Daily  . LORazepam  1 mg Oral BID  . methylPREDNISolone (SOLU-MEDROL) injection  40 mg Intravenous Q6H  . pantoprazole  40 mg Oral Daily  . polyethylene glycol  17 g Oral Daily  . potassium chloride SA  20 mEq Oral Daily  . saccharomyces boulardii  250 mg Oral Daily  . simvastatin  20 mg Oral QHS  . sodium chloride  3 mL Intravenous Q12H  . tiotropium  18 mcg Inhalation Daily   Continuous Infusions:    Principal Problem:   Respiratory failure Active Problems:   COPD (chronic obstructive pulmonary disease) with acute bronchitis   Muscular dystrophy   Chronic respiratory failure with hypercapnia    Time spent: >35 minutes     Kinnie Feil  [facility] Pager [phone]. If 7PM-7AM, please contact night-coverage at [email or web address], password [facility] 06/02/2013, 7:34 AM  LOS: 3 days

## 2013-06-02 NOTE — Progress Notes (Signed)
Patient's ABG results were not good, discussed with patient and placed her on BIPAP. RN notified.

## 2013-06-02 NOTE — H&P (Addendum)
Name: Karen Collier MRN: [id number] DOB: 03/06/1946    ADMISSION DATE:  06/06/2013 CONSULTATION DATE:  06/02/13  REFERRING MD :  Jeani Hawking  PRIMARY SERVICE: PCCM   CHIEF COMPLAINT:  Respiratory failure   BRIEF PATIENT DESCRIPTION: 68yo female with hx HTN, COPD on home O2, muscular dystrophy admitted to [facility] 1/14 with acute on chronic respiratory failure r/t AECOPD.  Continued to have worsening hypercarbia despite bipap and was intubated and tx 1/17 to [facility].   SIGNIFICANT EVENTS / STUDIES:  1/17>> worsening hypercarbia despite bipap, intubated, tx to [facility]   LINES / TUBES: ETT 1/17>>> piv  CULTURES: BCx2 1/17>>> Urine 1/17>>> Sputum 1/17>>> Resp virus panel 1/17>>>  ANTIBIOTICS: Levaquin 1/14>>>  HISTORY OF PRESENT ILLNESS:  68yo female with hx HTN, COPD on home O2, muscular dystrophy presented 1/14 to [facility] with increased SOB, productive cough, chills, increased BD use at home. Admitted to [facility] and rx for AECOPD with BD, steroids, Bipap.  Cont to worsen with increased CO2 despite bipap, requiring intubation and was tx to [facility] 1/17.   PAST MEDICAL HISTORY :  Past Medical History  Diagnosis Date  . Muscular dystrophy   . COPD (chronic obstructive pulmonary disease)   . Asthma   . Hypoxemia   . CHF (congestive heart failure)   . Chronic pain   . Hypertension   . Glaucoma   . Muscle weakness   . Chronic respiratory failure with hypercapnia     And hypoxia  . Atelectasis 11/21/2011  . Lung nodule 11/21/2011  . Thyroid nodule 11/21/2011  . Compensated alkalosis 11/21/2011  . Pneumonia 11/21/2011  . Hyponatremia Recurrent    Normalizes with normal saline  . GERD (gastroesophageal reflux disease)   . Acute diastolic CHF (congestive heart failure) 03/28/2013   Past Surgical History  Procedure Laterality Date  . Cholecystectomy    . Abdominal hysterectomy    . Colonoscopy N/A 12/07/2012    DIY:MEBRAXE lipoma; otherwise negative examination  .  Esophagogastroduodenoscopy N/A 12/07/2012    NMM:HWKGSUP polyps. Hiatal hernia. Status post gastric bx, chronic gastritis noted.    Prior to Admission medications   Medication Sig Start Date End Date Taking? Authorizing Provider  acetaminophen (TYLENOL) 500 MG tablet Take 500 mg by mouth every 6 (six) hours as needed for mild pain.   Yes Historical Provider, MD  albuterol (PROVENTIL) (2.5 MG/3ML) 0.083% nebulizer solution Take 2.5 mg by nebulization 3 (three) times daily as needed for shortness of breath.   Yes Historical Provider, MD  baclofen (LIORESAL) 10 MG tablet Take 10 mg by mouth 2 (two) times daily.   Yes Historical Provider, MD  Calcium Carbonate-Vitamin D (CALCIUM 600 + D PO) Take 1 tablet by mouth daily.   Yes Historical Provider, MD  carvedilol (COREG) 6.25 MG tablet Take 1 tablet (6.25 mg total) by mouth 2 (two) times daily. 11/23/11  Yes Elliot Cousin, MD  docusate sodium (COLACE) 100 MG capsule Take 100 mg by mouth at bedtime.    Yes Historical Provider, MD  fexofenadine (ALLEGRA) 180 MG tablet Take 180 mg by mouth daily.   Yes Historical Provider, MD  fluticasone (FLONASE) 50 MCG/ACT nasal spray Place 2 sprays into both nostrils daily.   Yes Historical Provider, MD  furosemide (LASIX) 20 MG tablet Take 2 tablets (40 mg total) by mouth 2 (two) times daily. 03/29/13  Yes Lesle Chris Black, NP  latanoprost (XALATAN) 0.005 % ophthalmic solution Place 1 drop into both eyes at  bedtime.   Yes Historical Provider, MD  liver oil-zinc oxide (DESITIN) 40 % ointment Apply 1 application topically as needed for irritation (apply to affected areas as needed for irritation).   Yes Historical Provider, MD  LORazepam (ATIVAN) 1 MG tablet Take 1 tablet (1 mg total) by mouth 2 (two) times daily. 11/23/11  Yes Rexene Alberts, MD  meclizine (ANTIVERT) 25 MG tablet Take 25 mg by mouth daily as needed for dizziness.   Yes Historical Provider, MD  metolazone (ZAROXOLYN) 2.5 MG tablet Take 1 tablet by mouth as  directed. Take 30 minutes before taking Lasix on Monday, Wednesday, and Friday. 04/30/13  Yes Historical Provider, MD  Multiple Vitamin (DAILY VITE PO) Take 1 tablet by mouth daily.   Yes Historical Provider, MD  naproxen (NAPROSYN) 500 MG tablet Take 500 mg by mouth 2 (two) times daily with a meal.   Yes Historical Provider, MD  omeprazole (PRILOSEC) 20 MG capsule Take 1 capsule (20 mg total) by mouth daily. 11/27/12  Yes Orvil Feil, NP  oxymetazoline (NASAL RELIEF) 0.05 % nasal spray Place 2 sprays into both nostrils every 6 (six) hours as needed for congestion.   Yes Historical Provider, MD  Polyethyl Glycol-Propyl Glycol 0.4-0.3 % SOLN Apply 1 drop to eye as needed (instill 1-2 drops in each eye as needed for dry eyes).   Yes Historical Provider, MD  polyethylene glycol powder (GLYCOLAX/MIRALAX) powder Take 17 g by mouth daily. As needed for a bowel movement daily. 01/23/13  Yes Orvil Feil, NP  potassium chloride SA (K-DUR,KLOR-CON) 20 MEQ tablet Take 20 mEq by mouth 2 (two) times daily.   Yes Historical Provider, MD  Probiotic Product (ALIGN) 4 MG CAPS Take 1 capsule by mouth daily.   Yes Historical Provider, MD  simethicone (MYLICON) 80 MG chewable tablet Chew 80 mg by mouth every 6 (six) hours as needed for flatulence.   Yes Historical Provider, MD  simvastatin (ZOCOR) 20 MG tablet Take 20 mg by mouth at bedtime.   Yes Historical Provider, MD  SPIRIVA HANDIHALER 18 MCG inhalation capsule Place 18 mcg into inhaler and inhale daily.  11/20/12  Yes Historical Provider, MD   No Known Allergies  FAMILY HISTORY:  Family History  Problem Relation Age of Onset  . Colon cancer Neg Hx    SOCIAL HISTORY:  reports that she has never smoked. She does not have any smokeless tobacco history on file. She reports that she does not drink alcohol or use illicit drugs.  REVIEW OF SYSTEMS:  As per HPI - otherwise unable (pt sedated on vent).    VITAL SIGNS: Temp:  [97.8 F (36.6 C)-98.4 F (36.9 C)] 97.8  F (36.6 C) (01/17 0730) Pulse Rate:  [63-112] 98 (01/17 0848) Resp:  [14-31] 14 (01/17 0848) BP: (111-149)/(48-81) 132/69 mmHg (01/17 0848) SpO2:  [88 %-97 %] 97 % (01/17 1000) FiO2 (%):  [40 %-50 %] 40 % (01/17 1000) Weight:  [223 lb 15.8 oz (101.6 kg)] 223 lb 15.8 oz (101.6 kg) (01/17 0500) HEMODYNAMICS:   VENTILATOR SETTINGS: Vent Mode:  [-] PRVC FiO2 (%):  [40 %-50 %] 40 % Set Rate:  [12 bmp-18 bmp] 18 bmp Vt Set:  [400 mL-500 mL] 400 mL PEEP:  [5 cmH20] 5 cmH20 Plateau Pressure:  [26 cmH20] 26 cmH20 INTAKE / OUTPUT: Intake/Output     01/16 0701 - 01/17 0700 01/17 0701 - 01/18 0700   P.O. 660    I.V. (mL/kg) 100 (1)    IV Piggyback  300    Total Intake(mL/kg) 1060 (10.4)    Urine (mL/kg/hr) 3300 (1.4)    Total Output 3300     Net -2240            PHYSICAL EXAMINATION:  General:  Chronically ill appearing female, NAD on vent post transport  Neuro:  Sedated on vent, RASS -1 HEENT:  Mm moist, ETT, no JVD  Cardiovascular:  s1s2 rrr Lungs:  resps even non labored on full vent support, few scattered rhonchi, otherwise clear, no audible wheeze  Abdomen:  Soft, +bs Musculoskeletal:  Warm and dry, scant BLE edema    LABS:  CBC  Recent Labs Lab 06/15/2013 1022 05/31/13 0409  WBC 8.3 6.9  HGB 12.7 12.0  HCT 37.5 34.7*  PLT 145* 147*   Coag's No results found for this basename: APTT, INR,  in the last 168 hours BMET  Recent Labs Lab 05/31/13 0409 06/01/13 0427 06/02/13 0458  NA 135* 133* 129*  K 3.6* 4.2 4.2  CL 85* 85* 84*  CO2 41* 41* 38*  BUN 8 10 15   CREATININE 0.26* 0.23* 0.28*  GLUCOSE 146* 145* 166*   Electrolytes  Recent Labs Lab 05/31/13 0409 06/01/13 0427 06/02/13 0458  CALCIUM 9.1 9.0 9.2   Sepsis Markers  Recent Labs Lab 06/03/2013 1023  LATICACIDVEN 1.5   ABG  Recent Labs Lab 05/23/2013 1015 06/02/13 0500  PHART 7.397 7.179*  PCO2ART 71.0* 107.0*  PO2ART 65.6* 79.1*   Liver Enzymes  Recent Labs Lab 06/16/2013 1022  AST  27  ALT 24  ALKPHOS 105  BILITOT 0.4  ALBUMIN 3.6   Cardiac Enzymes  Recent Labs Lab 05/21/2013 1022  TROPONINI <0.30  PROBNP 103.2   Glucose No results found for this basename: GLUCAP,  in the last 168 hours  Imaging Dg Chest Port 1 View  06/02/2013   CLINICAL DATA:  Endotracheal tube placement  EXAM: PORTABLE CHEST - 1 VIEW  COMPARISON:  06/09/2013  FINDINGS: Endotracheal tube has been placed with tip 4 cm above the carina.There is hypoventilatory change bilaterally which appears slightly worse when compared to prior study.  IMPRESSION: Endotracheal tube with tip 4 cm above the carina   Electronically Signed   By: Skipper Cliche M.D.   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   Diaphragm paralysis   PULMONARY Acute on chronic respiratory failure - likely multifactorial in setting AECOPD, CHF, underlying muscular dystrophy +/- viral illness Severe atelectasis and elevated diaphragms c/w severe muscle weakness Hx of muscular dystrophy AECOPD  P:   Cont vent support - increase itime, decrease RR with air trapping  Flu swab  F/u ABG  F/u CXR  BD's  Cont systemic steroids  abx as above  HCPOA says pt did not want long term vent support   CARDIOVASCULAR Chronic diastolic CHF (echo [id number] -- EF 60-65%, PA peak pressure 87mmHg) Hx HTN  P:  Cont gentle diuresis  F/u BNP  D/c diamox for now -- given for ??contraction alkalosis in setting diuresis - now significant respiratory acidosis, HCO3 =38 Cont coreg, lasix  RENAL Hyponatremia - mild  P:   F/u chem   GASTROINTESTINAL Protein calorie malnutrition - has been  unable to  eat x several days on bipap  P:   Start early TF  protonix for SUP   HEMATOLOGIC Thrombocytopenia - mild  P:  F/u cbc lovenox for DVT proph     INFECTIOUS AECOPD  ?viral illness  P:   Flu swab - although viral s/s were >5 days ago so hold tamiflu  Cont levaquin  ENDOCRINE No active issue  P:   Trend glucose   NEUROLOGIC AMS - in setting hypercarbia  Hx of muscular dystrophy  P:   Monitor, supportive care  Daily WUA  Propofol    I have personally obtained a history, examined the patient, evaluated laboratory and imaging results, formulated the assessment and plan and placed orders. CRITICAL CARE: The patient is critically ill with multiple organ systems failure and requires high complexity decision making for assessment and support, frequent evaluation and titration of therapies, application of advanced monitoring technologies and extensive interpretation of multiple databases. Critical Care Time devoted to patient care services described in this note is _40_ minutes.    Darlina Sicilian, NP 06/02/2013  10:53 AM Pager: [phone] or [phone]  *Care during the described time interval was provided by me and/or other providers on the critical care team. I have reviewed this patient's available data, including medical history, events of note, physical examination and test results as part of my evaluation.  Mariel Sleet Beeper  [phone]  Cell  [phone]  If no response or cell goes to voicemail, call beeper [phone]

## 2013-06-02 NOTE — Procedures (Signed)
Central Venous Catheter Insertion Procedure Note Karen Collier [id number] 09/16/1945  Procedure: Insertion of Central Venous Catheter Indications: Drug and/or fluid administration  Procedure Details Consent: Risks of procedure as well as the alternatives and risks of each were explained to the (patient/caregiver).  Consent for procedure obtained. Time Out: Verified patient identification, verified procedure, site/side was marked, verified correct patient position, special equipment/implants available, medications/allergies/relevent history reviewed, required imaging and test results available.  Performed  Maximum sterile technique was used including antiseptics, cap, gloves, gown, hand hygiene, mask and sheet. Skin prep: Chlorhexidine; local anesthetic administered A antimicrobial bonded/coated triple lumen catheter was placed in the left internal jugular vein using the Seldinger technique.  Evaluation Blood flow good Complications: No apparent complications Patient did tolerate procedure well. Chest X-ray ordered to verify placement.  CXR: pending.  Performed using ultrasound guidance under direct MD supervision.   Darlina Sicilian, NP 06/02/2013, 12:20 PM  I was present for this procedure. Saralyn Pilar WrightMD

## 2013-06-02 NOTE — Progress Notes (Signed)
E-link notified of pt decreased blood pressure after lasix dose.  Propofol turned off.  Will continue to monitor pt closely.

## 2013-06-02 NOTE — Procedures (Signed)
Intubation Procedure Note Karen Collier [id number] 1945/07/07  Procedure: Intubation Indications: Airway protection and maintenance  Procedure Details Consent: Risks of procedure as well as the alternatives and risks of each were explained to the (patient/caregiver).  Consent for procedure obtained. Time Out: Verified patient identification, verified procedure, site/side was marked, verified correct patient position, special equipment/implants available, medications/allergies/relevent history reviewed, required imaging and test results available.  Performed  Maximum sterile technique was used including gloves.  MAC and 3    Evaluation Hemodynamic Status: BP stable throughout; O2 sats: stable throughout Patient's Current Condition: stable Complications: No apparent complications Patient did tolerate procedure well. Chest X-ray ordered to verify placement.  CXR: pending.   Elsie Stain 06/02/2013

## 2013-06-02 NOTE — OR Nursing (Signed)
ICU intubation : required intubation per transport to [facility]. 100% sat,164/70,100 HR pre procedure. 7.0 ETT w/ Mac 3  Diprivan 80 mg and Anectine 60 mg.  Secured at [employer].Verified via c-xray. Post procedure:1323/69,100%,94 HR

## 2013-06-02 NOTE — Progress Notes (Signed)
carelink left ICU to transport pt to ICU @ [facility]

## 2013-06-02 NOTE — Progress Notes (Signed)
Patient refusing to wear BIPAP at this time. Patient agrees to go on if her work of breathing increases. RN in room when discussed with patient.

## 2013-06-02 NOTE — Progress Notes (Signed)
INITIAL NUTRITION ASSESSMENT  DOCUMENTATION CODES Per approved criteria  -Obesity Unspecified   INTERVENTION: - Initiate TF via OGT of Vital High Protein start at 10ml/hr increase by 39ml every 4 hours to goal of 19ml/hr with Prostat 47ml daily. Goal rate will provide 1300 calories, 120g protein, and 1014ml free water and meet 76% estimated calorie needs (22kcal/kg of ideal body weight) and 102% estimated protein needs - Continue adult enteral protocol - Unit RD to continue to monitor   NUTRITION DIAGNOSIS: Inadequate oral intake related to inability to eat as evidenced by NPO.   Goal: Enteral nutrition to provide 60-70% of estimated calorie needs (22-25 kcals/kg ideal body weight) and 100% of estimated protein needs, based on ASPEN guidelines for permissive underfeeding in critically ill obese individuals.  Monitor:  Weights, labs, TF tolerance/advancement, vent status  Reason for Assessment: Consult for TF management  68 y.o. female  Admitting Dx: Acute and chronic respiratory failure (acute-on-chronic)  ASSESSMENT: Pt with hx HTN, COPD on home O2, muscular dystrophy admitted to [facility] 1/14 with acute on chronic respiratory failure r/t AECOPD. Continued to have worsening hypercarbia despite bipap and was intubated and tx 1/17 to [facility].   POA present at bedside and reports pt from an assisted living facility where she was eating 3 meals/day on low sodium/chopped meats diet and had a stable weight.   Patient is currently intubated on ventilator support.  MV: 8.8 L/min Temp (24hrs), Avg:98.2 F (36.8 C), Min:97.8 F (36.6 C), Max:98.4 F (36.9 C)  Propofol: off   Height: Ht Readings from Last 1 Encounters:  06/02/13 5\' 6"  (1.676 m)    Weight: Wt Readings from Last 1 Encounters:  06/02/13 223 lb 15.8 oz (101.6 kg)    Ideal Body Weight: 130 lb  % Ideal Body Weight: 171%  Wt Readings from Last 10 Encounters:  06/02/13 223 lb 15.8 oz (101.6 kg)  03/28/13  216 lb 0.8 oz (98 kg)  11/27/12 219 lb (99.338 kg)  11/21/11 205 lb 7.5 oz (93.2 kg)  11/16/11 209 lb 14.1 oz (95.2 kg)  07/31/11 208 lb (94.348 kg)    Usual Body Weight: 223 lb  % Usual Body Weight: 100%  BMI:  Body mass index is 36.17 kg/(m^2). Class II obesity  Estimated Nutritional Needs: Kcal: 1719 Protein: 118g/day Fluid: >1.7L/day  Skin: Non-pitting RLE, LLE edema  Diet Order: Sodium Restricted  EDUCATION NEEDS: -No education needs identified at this time   Intake/Output Summary (Last 24 hours) at 06/02/13 1153 Last data filed at 06/02/13 0600  Gross per 24 hour  Intake   1060 ml  Output   3300 ml  Net  -2240 ml    Last BM: 1/13  Labs:   Recent Labs Lab 05/31/13 0409 06/01/13 0427 06/02/13 0458  NA 135* 133* 129*  K 3.6* 4.2 4.2  CL 85* 85* 84*  CO2 41* 41* 38*  BUN 8 10 15   CREATININE 0.26* 0.23* 0.28*  CALCIUM 9.1 9.0 9.2  GLUCOSE 146* 145* 166*    CBG (last 3)  No results found for this basename: GLUCAP,  in the last 72 hours  Scheduled Meds: . antiseptic oral rinse  15 mL Mouth Rinse q12n4p  . baclofen  10 mg Oral BID  . carvedilol  6.25 mg Oral BID  . chlorhexidine  15 mL Mouth Rinse BID  . docusate sodium  100 mg Oral QHS  . enoxaparin (LOVENOX) injection  40 mg Subcutaneous Q24H  . furosemide  40 mg Intravenous  Daily  . guaiFENesin  1,200 mg Oral BID  . ipratropium-albuterol  3 mL Nebulization Q6H  . latanoprost  1 drop Both Eyes QHS  . levofloxacin (LEVAQUIN) IV  750 mg Intravenous Q24H  . lidocaine (cardiac) 100 mg/56ml      . loratadine  10 mg Oral Daily  . methylPREDNISolone (SOLU-MEDROL) injection  40 mg Intravenous Q6H  . pantoprazole (PROTONIX) IV  40 mg Intravenous Q24H  . polyethylene glycol  17 g Oral Daily  . potassium chloride SA  20 mEq Oral Daily  . propofol      . rocuronium      . saccharomyces boulardii  250 mg Oral Daily  . simvastatin  20 mg Oral QHS  . sodium chloride  3 mL Intravenous Q12H  .  succinylcholine        Continuous Infusions: . feeding supplement (VITAL AF 1.2 CAL)      Past Medical History  Diagnosis Date  . Muscular dystrophy   . COPD (chronic obstructive pulmonary disease)   . Asthma   . Hypoxemia   . CHF (congestive heart failure)   . Chronic pain   . Hypertension   . Glaucoma   . Muscle weakness   . Chronic respiratory failure with hypercapnia     And hypoxia  . Atelectasis 11/21/2011  . Lung nodule 11/21/2011  . Thyroid nodule 11/21/2011  . Compensated alkalosis 11/21/2011  . Pneumonia 11/21/2011  . Hyponatremia Recurrent    Normalizes with normal saline  . GERD (gastroesophageal reflux disease)   . Acute diastolic CHF (congestive heart failure) 03/28/2013    Past Surgical History  Procedure Laterality Date  . Cholecystectomy    . Abdominal hysterectomy    . Colonoscopy N/A 12/07/2012    XBM:WUXLKGM lipoma; otherwise negative examination  . Esophagogastroduodenoscopy N/A 12/07/2012    WNU:UVOZDGU polyps. Hiatal hernia. Status post gastric bx, chronic gastritis noted.     Mikey College MS, RD, LDN [phone] Weekend/After Hours Pager

## 2013-06-03 DIAGNOSIS — J9819 Other pulmonary collapse: Secondary | ICD-10-CM

## 2013-06-03 DIAGNOSIS — G7109 Other specified muscular dystrophies: Secondary | ICD-10-CM

## 2013-06-03 DIAGNOSIS — J984 Other disorders of lung: Secondary | ICD-10-CM

## 2013-06-03 DIAGNOSIS — J986 Disorders of diaphragm: Secondary | ICD-10-CM

## 2013-06-03 DIAGNOSIS — D696 Thrombocytopenia, unspecified: Secondary | ICD-10-CM

## 2013-06-03 LAB — BASIC METABOLIC PANEL
BUN: 22 mg/dL (ref 6–23)
CO2: 30 mEq/L (ref 19–32)
Calcium: 8.8 mg/dL (ref 8.4–10.5)
Chloride: 92 mEq/L — ABNORMAL LOW (ref 96–112)
Creatinine, Ser: 0.29 mg/dL — ABNORMAL LOW (ref 0.50–1.10)
Glucose, Bld: 154 mg/dL — ABNORMAL HIGH (ref 70–99)
POTASSIUM: 3.6 meq/L — AB (ref 3.7–5.3)
SODIUM: 136 meq/L — AB (ref 137–147)

## 2013-06-03 LAB — CBC
HCT: 37.7 % (ref 36.0–46.0)
HEMOGLOBIN: 13.1 g/dL (ref 12.0–15.0)
MCH: 29.9 pg (ref 26.0–34.0)
MCHC: 34.7 g/dL (ref 30.0–36.0)
MCV: 86.1 fL (ref 78.0–100.0)
Platelets: 147 10*3/uL — ABNORMAL LOW (ref 150–400)
RBC: 4.38 MIL/uL (ref 3.87–5.11)
RDW: 13.7 % (ref 11.5–15.5)
WBC: 8 10*3/uL (ref 4.0–10.5)

## 2013-06-03 LAB — GLUCOSE, CAPILLARY
GLUCOSE-CAPILLARY: 158 mg/dL — AB (ref 70–99)
GLUCOSE-CAPILLARY: 161 mg/dL — AB (ref 70–99)
Glucose-Capillary: 205 mg/dL — ABNORMAL HIGH (ref 70–99)
Glucose-Capillary: 140 mg/dL — ABNORMAL HIGH (ref 70–99)
Glucose-Capillary: 152 mg/dL — ABNORMAL HIGH (ref 70–99)

## 2013-06-03 MED ORDER — FENTANYL CITRATE 0.05 MG/ML IJ SOLN
50.0000 ug | INTRAMUSCULAR | Status: DC | PRN
  Administered 2013-06-04 – 2013-06-05 (×4): 50 ug via INTRAVENOUS
  Filled 2013-06-03 (×3): qty 2

## 2013-06-03 MED ORDER — FENTANYL CITRATE 0.05 MG/ML IJ SOLN
50.0000 ug | INTRAMUSCULAR | Status: DC | PRN
  Filled 2013-06-03: qty 2

## 2013-06-03 NOTE — H&P (Signed)
Name: Karen Collier MRN: [id number] DOB: 1945/08/12    ADMISSION DATE:  06/08/2013 CONSULTATION DATE:  06/02/13  REFERRING MD :  Forestine Na  PRIMARY SERVICE: PCCM   CHIEF COMPLAINT:  Respiratory failure   BRIEF PATIENT DESCRIPTION: 68yo female with hx HTN, COPD on home O2, muscular dystrophy admitted to [facility] 1/14 with acute on chronic respiratory failure r/t AECOPD.  Continued to have worsening hypercarbia despite bipap and was intubated and tx 1/17 to [facility].   SIGNIFICANT EVENTS / STUDIES:  1/17>> worsening hypercarbia despite bipap, intubated, tx to [place] / TUBES: ETT 1/17>>> IJ CVL 1/17  CULTURES: BCx2 1/17>>> Urine 1/17>>> Sputum 1/17>>> Resp virus panel 1/17>>>  ANTIBIOTICS: Levaquin 1/14>>>   VITAL SIGNS: Temp:  [97.5 F (36.4 C)-99.2 F (37.3 C)] 99 F (37.2 C) (01/18 0502) Pulse Rate:  [76-106] 97 (01/18 0630) Resp:  [10-25] 18 (01/18 0630) BP: (73-132)/(27-82) 103/55 mmHg (01/18 0630) SpO2:  [88 %-99 %] 97 % (01/18 0630) FiO2 (%):  [40 %-50 %] 40 % (01/18 0400) Weight:  [95 kg (209 lb 7 oz)-97.3 kg (214 lb 8.1 oz)] 95 kg (209 lb 7 oz) (01/18 0500) HEMODYNAMICS: cv stable   VENTILATOR SETTINGS: Vent Mode:  [-] PRVC FiO2 (%):  [40 %-50 %] 40 % Set Rate:  [12 bmp-18 bmp] 16 bmp Vt Set:  [400 mL-500 mL] 500 mL PEEP:  [5 cmH20] 5 cmH20 Plateau Pressure:  [22 cmH20-27 cmH20] 22 cmH20 INTAKE / OUTPUT: Intake/Output     01/17 0701 - 01/18 0700 01/18 0701 - 01/19 0700   P.O.     I.V. (mL/kg) 556.1 (5.9)    NG/GT 754.3    IV Piggyback 150    Total Intake(mL/kg) 1460.5 (15.4)    Urine (mL/kg/hr) 2900 (1.3)    Total Output 2900     Net -1439.6            PHYSICAL EXAMINATION:  General:  Chronically ill appearing female, NAD on vent  Neuro:  Sedated on vent, RASS -2 HEENT:  Mm moist, ETT, no JVD  Cardiovascular:  s1s2 rrr Lungs:  resps even non labored on full vent support, otherwise clear, no audible wheeze  Abdomen:  Soft,  +bs Musculoskeletal:  Warm and dry, scant BLE edema    LABS:  CBC  Recent Labs Lab 06/16/2013 1022 05/31/13 0409 06/03/13 0500  WBC 8.3 6.9 8.0  HGB 12.7 12.0 13.1  HCT 37.5 34.7* 37.7  PLT 145* 147* 147*   Coag's No results found for this basename: APTT, INR,  in the last 168 hours BMET  Recent Labs Lab 06/01/13 0427 06/02/13 0458 06/03/13 0500  NA 133* 129* 136*  K 4.2 4.2 3.6*  CL 85* 84* 92*  CO2 41* 38* 30  BUN 10 15 22   CREATININE 0.23* 0.28* 0.29*  GLUCOSE 145* 166* 154*   Electrolytes  Recent Labs Lab 06/01/13 0427 06/02/13 0458 06/03/13 0500  CALCIUM 9.0 9.2 8.8   Sepsis Markers  Recent Labs Lab 05/17/2013 1023  LATICACIDVEN 1.5   ABG  Recent Labs Lab 06/09/2013 1015 06/02/13 0500 06/02/13 1118  PHART 7.397 7.179* 7.336*  PCO2ART 71.0* 107.0* 70.0*  PO2ART 65.6* 79.1* 86.0   Liver Enzymes  Recent Labs Lab 05/18/2013 1022  AST 27  ALT 24  ALKPHOS 105  BILITOT 0.4  ALBUMIN 3.6   Cardiac Enzymes  Recent Labs Lab 05/29/2013 1022  TROPONINI <0.30  PROBNP 103.2   Glucose  Recent Labs  Lab 06/02/13 1350 06/02/13 1513 06/02/13 2011 06/03/13 0008 06/03/13 0507  GLUCAP 148* 160* 172* 161* 158*    Imaging Dg Chest Port 1 View  06/02/2013   CLINICAL DATA:  Central line, orogastric tube and endotracheal tube placement.  EXAM: PORTABLE CHEST - 1 VIEW  COMPARISON:  Earlier same date.  FINDINGS: 1225 hr. Endotracheal tube appears unchanged within the mid trachea. An orogastric tube projects below the diaphragm with the tip at the gastric fundus. There is a left IJ central venous catheter with its tip overlying the azygos region.  There are persistent low lung volumes with bibasilar atelectasis. No pneumothorax is demonstrated. The heart size and mediastinal contours are stable.  IMPRESSION: Central line and orogastric tube placement as described. No pneumothorax. Stable bibasilar atelectasis.   Electronically Signed   By: Camie Patience M.D.    On: 06/02/2013 13:07   Dg Chest Port 1 View  06/02/2013   CLINICAL DATA:  Endotracheal tube placement  EXAM: PORTABLE CHEST - 1 VIEW  COMPARISON:  06/08/2013  FINDINGS: Endotracheal tube has been placed with tip 4 cm above the carina.There is hypoventilatory change bilaterally which appears slightly worse when compared to prior study.  IMPRESSION: Endotracheal tube with tip 4 cm above the carina   Electronically Signed   By: Skipper Cliche M.D.   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   Diaphragm paralysis   PULMONARY Acute on chronic respiratory failure - likely multifactorial in setting AECOPD, CHF, underlying muscular dystrophy +/- viral illness Severe atelectasis and elevated diaphragms c/w severe muscle weakness Hx of muscular dystrophy AECOPD  P:   Wean off propofol, RASS 0 Cont full vent Consider trial of wean to VPAP  CARDIOVASCULAR Chronic diastolic CHF (echo 93/7902 -- EF 60-65%, PA peak pressure 67mmHg) Hx HTN  P:   Cont coreg, lasix  RENAL Hyponatremia - mild improved P:   F/u chem   GASTROINTESTINAL Protein calorie malnutrition - has been unable to eat x several days on bipap  P:   Cont TF  protonix for SUP   HEMATOLOGIC Thrombocytopenia - mild  P:  F/u cbc lovenox for DVT proph     INFECTIOUS AECOPD  ?viral illness  P:   Flu swab - although viral s/s were >5 days ago so hold tamiflu  Cont levaquin  ENDOCRINE No active issue  P:   Trend glucose   NEUROLOGIC AMS - in setting hypercarbia  Hx of muscular dystrophy  P:   Monitor, supportive care  Daily WUA  Wean off propofol, use prn fentanyl RASS 0 is goal   I have personally  obtained a history, examined the patient, evaluated laboratory and imaging results, formulated the assessment and plan and placed orders. CRITICAL CARE: The patient is critically ill with multiple organ systems failure and requires high complexity decision making for assessment and support, frequent evaluation and titration of therapies, application of advanced monitoring technologies and extensive interpretation of multiple databases. Critical Care Time devoted to patient care services described in this note is _40_ minutes.    Mariel Sleet Beeper  [phone]  Cell  [phone]  If no response or cell goes to voicemail, call beeper [phone]

## 2013-06-03 NOTE — Progress Notes (Signed)
PCCM Progress Note   Name: Karen Collier MRN: [id number] DOB: 1945-11-06    ADMISSION DATE:  05/23/2013 CONSULTATION DATE:  06/02/13  REFERRING MD :  Forestine Na  PRIMARY SERVICE: PCCM   CHIEF COMPLAINT:  Respiratory failure   BRIEF PATIENT DESCRIPTION: 68yo female with hx HTN, COPD on home O2, muscular dystrophy admitted to [facility] 1/14 with acute on chronic respiratory failure r/t AECOPD.  Continued to have worsening hypercarbia despite bipap and was intubated and tx 1/17 to [facility].   SIGNIFICANT EVENTS / STUDIES:  1/17>> worsening hypercarbia despite bipap, intubated, tx to [facility]  1/18>> hypotension sbps 70s-80s while in deep sleep on Propofol   LINES / TUBES: ETT 1/17>>> piv  CULTURES: BCx2 1/17>>>ngtd Urine 1/17>>>ngtd Sputum 1/17>>>ngtd Resp virus panel 1/17>>>  ANTIBIOTICS: Levaquin 1/14>>>  HISTORY OF PRESENT ILLNESS:  68yo female with hx HTN, COPD on home O2, muscular dystrophy presented 1/14 to [facility] with increased SOB, productive cough, chills, increased BD use at home. Admitted to [facility] and rx for AECOPD with BD, steroids, Bipap.  Cont to worsen with increased CO2 despite bipap, requiring intubation and was tx to [facility] 1/17.    VITAL SIGNS: Temp:  [97.5 F (36.4 C)-99.2 F (37.3 C)] 99 F (37.2 C) (01/18 0502) Pulse Rate:  [76-106] 97 (01/18 0630) Resp:  [10-25] 18 (01/18 0630) BP: (73-132)/(27-82) 103/55 mmHg (01/18 0630) SpO2:  [88 %-99 %] 97 % (01/18 0630) FiO2 (%):  [40 %-50 %] 40 % (01/18 0400) Weight:  [209 lb 7 oz (95 kg)-214 lb 8.1 oz (97.3 kg)] 209 lb 7 oz (95 kg) (01/18 0500) HEMODYNAMICS:   VENTILATOR SETTINGS: Vent Mode:  [-] PRVC FiO2 (%):  [40 %-50 %] 40 % Set Rate:  [12 bmp-18 bmp] 16 bmp Vt Set:  [400 mL-500 mL] 500 mL PEEP:  [5 cmH20] 5 cmH20 Plateau Pressure:  [22 cmH20-27 cmH20] 22 cmH20  INTAKE / OUTPUT: Intake/Output     01/17 0701 - 01/18 0700 01/18 0701 - 01/19 0700   P.O.     I.V. (mL/kg) 556.1 (5.9)     NG/GT 754.3    IV Piggyback 150    Total Intake(mL/kg) 1460.5 (15.4)    Urine (mL/kg/hr) 2900 (1.3)    Total Output 2900     Net -1439.6            PHYSICAL EXAMINATION:  General: on vent, NAD Neuro:  Alert, good eye contact, responds appropriately and cooperative HEENT:  ETT, EOMI Cardiovascular:  RRR no murmurs appreciated Lungs:  resps even non labored on full vent support, right base with decreased air movement, few scattered rhonchi, no wheezes appreciated Abdomen:  Soft, normoactive bowel sounds, no tenderness, no distension Musculoskeletal:  Warm and dry, trace BLE non-pitting edema, strong hand grip, able to lift legs against gravity, cannot ambulate secondary to muscular dystrophy   LABS:  CBC  Recent Labs Lab 05/28/2013 1022 05/31/13 0409 06/03/13 0500  WBC 8.3 6.9 8.0  HGB 12.7 12.0 13.1  HCT 37.5 34.7* 37.7  PLT 145* 147* 147*   Coag's No results found for this basename: APTT, INR,  in the last 168 hours BMET  Recent Labs Lab 06/01/13 0427 06/02/13 0458 06/03/13 0500  NA 133* 129* 136*  K 4.2 4.2 3.6*  CL 85* 84* 92*  CO2 41* 38* 30  BUN 10 15 22   CREATININE 0.23* 0.28* 0.29*  GLUCOSE 145* 166* 154*   Electrolytes  Recent Labs Lab 06/01/13 0427 06/02/13 0458  06/03/13 0500  CALCIUM 9.0 9.2 8.8   Sepsis Markers  Recent Labs Lab 05/25/2013 1023  LATICACIDVEN 1.5   ABG  Recent Labs Lab 06/02/2013 1015 06/02/13 0500 06/02/13 1118  PHART 7.397 7.179* 7.336*  PCO2ART 71.0* 107.0* 70.0*  PO2ART 65.6* 79.1* 86.0   Liver Enzymes  Recent Labs Lab 06/16/2013 1022  AST 27  ALT 24  ALKPHOS 105  BILITOT 0.4  ALBUMIN 3.6   Cardiac Enzymes  Recent Labs Lab 05/26/2013 1022  TROPONINI <0.30  PROBNP 103.2   Glucose  Recent Labs Lab 06/02/13 1350 06/02/13 1513 06/02/13 2011 06/03/13 0008 06/03/13 0507  GLUCAP 148* 160* 172* 161* 158*    Imaging Dg Chest Port 1 View  06/02/2013   CLINICAL DATA:  Central line, orogastric tube  and endotracheal tube placement.  EXAM: PORTABLE CHEST - 1 VIEW  COMPARISON:  Earlier same date.  FINDINGS: 1225 hr. Endotracheal tube appears unchanged within the mid trachea. An orogastric tube projects below the diaphragm with the tip at the gastric fundus. There is a left IJ central venous catheter with its tip overlying the azygos region.  There are persistent low lung volumes with bibasilar atelectasis. No pneumothorax is demonstrated. The heart size and mediastinal contours are stable.  IMPRESSION: Central line and orogastric tube placement as described. No pneumothorax. Stable bibasilar atelectasis.   Electronically Signed   By: Camie Patience M.D.   On: 06/02/2013 13:07   Dg Chest Port 1 View  06/02/2013   CLINICAL DATA:  Endotracheal tube placement  EXAM: PORTABLE CHEST - 1 VIEW  COMPARISON:  06/14/2013  FINDINGS: Endotracheal tube has been placed with tip 4 cm above the carina.There is hypoventilatory change bilaterally which appears slightly worse when compared to prior study.  IMPRESSION: Endotracheal tube with tip 4 cm above the carina   Electronically Signed   By: Skipper Cliche M.D.   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6* 79.1* 86.0  HCO3 42.8* 38.3* 37.1*  TCO2 38.4 35.8 39  O2SAT 92.0 94.4 95.0     CARDIOVASCULAR Chronic diastolic CHF (echo 14/4315 -- EF 60-65%, PA peak pressure 16mmHg) without exacerbation, pBNP 103 s/p diamox and lasix therapy Hx HTN with episodes of hypotension this admission in setting of Propofol, Lasix and Coreg P:  F/u BNP  diamox d/c'd for now -- given for ??contraction alkalosis in setting diuresis - now significant respiratory acidosis, HCO3 =38 Cont coreg,  Consider decreasing lasix to home regimen given UOP >6L since admission with net I/O -3.6L in setting of hypotension  RENAL Mild Hyponatremia - resolving  P:   Cont to monitor   Recent Labs Lab 05/20/2013 1022 05/31/13 0409 06/01/13 0427 06/02/13 0458 06/03/13 0500  NA 138 135* 133* 129* 136*    GASTROINTESTINAL Protein calorie malnutrition - has been unable to eat x several days while on bipap  P:   TF initiated 1/17, cont  protonix for SUP   HEMATOLOGIC Thrombocytopenia - mild, stable  P:  Trend platelets Cont lovenox for DVT proph    Recent Labs Lab 05/29/2013 1022 05/31/13 0409 06/03/13 0500  PLT 145* 147* 147*     INFECTIOUS AECOPD on Levaquin Day #4 ?viral illness, no leukocytosis P:   Flu swab --> neg Cont levaquin  Recent Labs Lab 06/06/2013 1022 05/31/13 0409 06/03/13 0500  WBC 8.3 6.9 8.0    ENDOCRINE No active issue  P:   Trend glucose  CBG (last 3)   Recent Labs  06/02/13 2011 06/03/13 0008 06/03/13 0507  GLUCAP 172* 161* 158*     NEUROLOGIC AMS - in setting hypercarbia  Hx of muscular dystrophy  P:   Monitor, supportive care  Daily WUA  Propofol   Disposition: Pt confirmed code status and endorsed via head nodding that she wants CPR including compressions but not  long term vent support.  Dorian Heckle, MD Internal Medicine Resident, PGY3 06/03/2013  7:26 AM   See attending note on file. Mariel Sleet Beeper  [phone]  Cell  [phone]  If no response or cell goes to voicemail, call beeper [phone]

## 2013-06-03 NOTE — Progress Notes (Signed)
Dr Joya Gaskins aware of gram + cocci in clusters in aerobic blood  culture  bottle

## 2013-06-04 ENCOUNTER — Inpatient Hospital Stay (HOSPITAL_COMMUNITY): Payer: Medicare Other

## 2013-06-04 DIAGNOSIS — R0989 Other specified symptoms and signs involving the circulatory and respiratory systems: Secondary | ICD-10-CM

## 2013-06-04 DIAGNOSIS — R0609 Other forms of dyspnea: Secondary | ICD-10-CM

## 2013-06-04 LAB — POCT I-STAT 3, ART BLOOD GAS (G3+)
Acid-Base Excess: 14 mmol/L — ABNORMAL HIGH (ref 0.0–2.0)
Bicarbonate: 40.8 mEq/L — ABNORMAL HIGH (ref 20.0–24.0)
O2 Saturation: 96 %
PCO2 ART: 60.1 mmHg — AB (ref 35.0–45.0)
PH ART: 7.44 (ref 7.350–7.450)
TCO2: 43 mmol/L (ref 0–100)
pO2, Arterial: 82 mmHg (ref 80.0–100.0)

## 2013-06-04 LAB — GLUCOSE, CAPILLARY
GLUCOSE-CAPILLARY: 165 mg/dL — AB (ref 70–99)
GLUCOSE-CAPILLARY: 169 mg/dL — AB (ref 70–99)
GLUCOSE-CAPILLARY: 161 mg/dL — AB (ref 70–99)
GLUCOSE-CAPILLARY: 157 mg/dL — AB (ref 70–99)
GLUCOSE-CAPILLARY: 154 mg/dL — AB (ref 70–99)
Glucose-Capillary: 139 mg/dL — ABNORMAL HIGH (ref 70–99)
Glucose-Capillary: 146 mg/dL — ABNORMAL HIGH (ref 70–99)
Glucose-Capillary: 173 mg/dL — ABNORMAL HIGH (ref 70–99)

## 2013-06-04 LAB — CULTURE, BLOOD (ROUTINE X 2)

## 2013-06-04 LAB — BASIC METABOLIC PANEL
BUN: 24 mg/dL — AB (ref 6–23)
CALCIUM: 8.8 mg/dL (ref 8.4–10.5)
CO2: 32 mEq/L (ref 19–32)
Chloride: 97 mEq/L (ref 96–112)
Creatinine, Ser: 0.28 mg/dL — ABNORMAL LOW (ref 0.50–1.10)
GFR calc Af Amer: >90 mL/min (ref >90)
GLUCOSE: 186 mg/dL — AB (ref 70–99)
Potassium: 3.6 mEq/L — ABNORMAL LOW (ref 3.7–5.3)
Sodium: 141 mEq/L (ref 137–147)

## 2013-06-04 LAB — CBC
HCT: 37.3 % (ref 36.0–46.0)
Hemoglobin: 12.5 g/dL (ref 12.0–15.0)
MCH: 29.6 pg (ref 26.0–34.0)
MCHC: 33.5 g/dL (ref 30.0–36.0)
MCV: 88.4 fL (ref 78.0–100.0)
PLATELETS: 127 10*3/uL — AB (ref 150–400)
RBC: 4.22 MIL/uL (ref 3.87–5.11)
RDW: 14 % (ref 11.5–15.5)
WBC: 7.1 10*3/uL (ref 4.0–10.5)

## 2013-06-04 MED ORDER — VANCOMYCIN HCL IN DEXTROSE 1-5 GM/200ML-% IV SOLN
1000.0000 mg | Freq: Three times a day (TID) | INTRAVENOUS | Status: DC
  Administered 2013-06-04: 1000 mg via INTRAVENOUS
  Filled 2013-06-04 (×2): qty 200

## 2013-06-04 NOTE — Progress Notes (Signed)
ANTIBIOTIC CONSULT NOTE - INITIAL  Pharmacy Consult for vancomycin Indication: GPC in blood Cx  No Known Allergies  Patient Measurements: Height: 5\' 6"  (167.6 cm) Weight: 209 lb 7 oz (95 kg) IBW/kg (Calculated) : 59.3  Vital Signs: Temp: 99.3 F (37.4 C) (01/19 0100) Temp src: Oral (01/19 0100) BP: 135/57 mmHg (01/19 0700) Pulse Rate: 93 (01/19 0700) Intake/Output from previous day: 01/18 0701 - 01/19 0700 In: 2280.8 [I.V.:745.8; NG/GT:1435; IV Piggyback:100] Out: 2775 [Urine:2775]  Labs:  Recent Labs  06/02/13 0458 06/03/13 0500 06/04/13 0445  WBC  --  8.0 7.1  HGB  --  13.1 12.5  PLT  --  147* 127*  CREATININE 0.28* 0.29* 0.28*   Estimated Creatinine Clearance: 77.1 ml/min (by C-G formula based on Cr of 0.28).   Microbiology: Recent Results (from the past 720 hour(s))  MRSA PCR SCREENING     Status: None   Collection Time    06/13/2013  1:50 PM      Result Value Range Status   MRSA by PCR NEGATIVE  NEGATIVE Final   Comment:            The GeneXpert MRSA Assay (FDA     approved for NASAL specimens     only), is one component of a     comprehensive MRSA colonization     surveillance program. It is not     intended to diagnose MRSA     infection nor to guide or     monitor treatment for     MRSA infections.  MRSA PCR SCREENING     Status: None   Collection Time    06/02/13 10:41 AM      Result Value Range Status   MRSA by PCR NEGATIVE  NEGATIVE Final   Comment:            The GeneXpert MRSA Assay (FDA     approved for NASAL specimens     only), is one component of a     comprehensive MRSA colonization     surveillance program. It is not     intended to diagnose MRSA     infection nor to guide or     monitor treatment for     MRSA infections.  CULTURE, BLOOD (ROUTINE X 2)     Status: None   Collection Time    06/02/13  1:40 PM      Result Value Range Status   Specimen Description BLOOD LEFT ARM   Final   Special Requests BOTTLES DRAWN AEROBIC ONLY  2CC   Final   Culture  Setup Time     Final   Value: 06/02/2013 17:12     Performed at [facility]   Culture     Final   Value:        BLOOD CULTURE RECEIVED NO GROWTH TO DATE CULTURE WILL BE HELD FOR 5 DAYS BEFORE ISSUING A FINAL NEGATIVE REPORT     Performed at [facility]   Report Status PENDING   Incomplete  CULTURE, BLOOD (ROUTINE X 2)     Status: None   Collection Time    06/02/13  1:50 PM      Result Value Range Status   Specimen Description BLOOD LEFT HAND   Final   Special Requests BOTTLES DRAWN AEROBIC ONLY 0.5CC   Final   Culture  Setup Time     Final   Value: 06/02/2013 17:11     Performed at [facility]   Culture     Final   Value: GRAM POSITIVE COCCI IN CLUSTERS     Note: Gram Stain Report Called to,Read Back By and Verified With: Monia Sabal RN @ [phone] 06/03/13 BY KRAWS     Performed at [facility]   Report Status PENDING   Incomplete    Medical History: Past Medical History  Diagnosis Date  . Muscular dystrophy   . COPD (chronic obstructive pulmonary disease)   . Asthma   . Hypoxemia   . CHF (congestive heart failure)   . Chronic pain   . Hypertension   . Glaucoma   . Muscle weakness   . Chronic respiratory failure with hypercapnia     And hypoxia  . Atelectasis 11/21/2011  . Lung nodule 11/21/2011  . Thyroid nodule 11/21/2011  . Compensated alkalosis 11/21/2011  . Pneumonia 11/21/2011  . Hyponatremia Recurrent    Normalizes with normal saline  . GERD (gastroesophageal reflux disease)   . Acute diastolic CHF (congestive heart failure) 03/28/2013    Medications:  Prescriptions prior to admission  Medication Sig Dispense Refill  . acetaminophen (TYLENOL) 500 MG tablet Take 500 mg by mouth every 6 (six) hours as needed for mild pain.      Marland Kitchen albuterol (PROVENTIL) (2.5 MG/3ML) 0.083% nebulizer solution Take 2.5 mg by nebulization 3 (three) times daily as needed for shortness of breath.      . baclofen (LIORESAL) 10 MG tablet  Take 10 mg by mouth 2 (two) times daily.      . Calcium Carbonate-Vitamin D (CALCIUM 600 + D PO) Take 1 tablet by mouth daily.      . carvedilol (COREG) 6.25 MG tablet Take 1 tablet (6.25 mg total) by mouth 2 (two) times daily.  60 tablet  2  . docusate sodium (COLACE) 100 MG capsule Take 100 mg by mouth at bedtime.       . fexofenadine (ALLEGRA) 180 MG tablet Take 180 mg by mouth daily.      . fluticasone (FLONASE) 50 MCG/ACT nasal spray Place 2 sprays into both nostrils daily.      . furosemide (LASIX) 20 MG tablet Take 2 tablets (40 mg total) by mouth 2 (two) times daily.  30 tablet  0  . latanoprost (XALATAN) 0.005 % ophthalmic solution Place 1 drop into both eyes at bedtime.      Marland Kitchen liver oil-zinc oxide (DESITIN) 40 % ointment Apply 1 application topically as needed for irritation (apply to affected areas as needed for irritation).      . LORazepam (ATIVAN) 1 MG tablet Take 1 tablet (1 mg total) by mouth 2 (two) times daily.      . meclizine (ANTIVERT) 25 MG tablet Take 25 mg by mouth daily as needed for dizziness.      . metolazone (ZAROXOLYN) 2.5 MG tablet Take 1 tablet by mouth as directed. Take 30 minutes before taking Lasix on Monday, Wednesday, and Friday.      . Multiple Vitamin (DAILY VITE PO) Take 1 tablet by mouth daily.      . naproxen (NAPROSYN) 500 MG tablet Take 500 mg by mouth 2 (two) times daily with a meal.      . omeprazole (PRILOSEC) 20 MG capsule Take 1 capsule (20 mg total) by mouth daily.  30 capsule  3  . oxymetazoline (NASAL RELIEF) 0.05 % nasal spray Place 2 sprays into both nostrils every 6 (six) hours as needed for congestion.      Marland Kitchen  Polyethyl Glycol-Propyl Glycol 0.4-0.3 % SOLN Apply 1 drop to eye as needed (instill 1-2 drops in each eye as needed for dry eyes).      . polyethylene glycol powder (GLYCOLAX/MIRALAX) powder Take 17 g by mouth daily. As needed for a bowel movement daily.  255 g  11  . potassium chloride SA (K-DUR,KLOR-CON) 20 MEQ tablet Take 20 mEq by  mouth 2 (two) times daily.      . Probiotic Product (ALIGN) 4 MG CAPS Take 1 capsule by mouth daily.      . simethicone (MYLICON) 80 MG chewable tablet Chew 80 mg by mouth every 6 (six) hours as needed for flatulence.      . simvastatin (ZOCOR) 20 MG tablet Take 20 mg by mouth at bedtime.      Marland Kitchen SPIRIVA HANDIHALER 18 MCG inhalation capsule Place 18 mcg into inhaler and inhale daily.        Scheduled:  . antiseptic oral rinse  15 mL Mouth Rinse q12n4p  . baclofen  10 mg Oral BID  . carvedilol  6.25 mg Oral BID  . chlorhexidine  15 mL Mouth Rinse BID  . docusate  100 mg Oral QHS  . enoxaparin (LOVENOX) injection  40 mg Subcutaneous Q24H  . feeding supplement (PRO-STAT SUGAR FREE 64)  30 mL Per Tube Q1400  . furosemide  40 mg Intravenous Daily  . guaifenesin  400 mg Oral Q4H  . insulin aspart  0-9 Units Subcutaneous Q4H  . ipratropium-albuterol  3 mL Nebulization Q6H  . latanoprost  1 drop Both Eyes QHS  . levofloxacin (LEVAQUIN) IV  750 mg Intravenous Q24H  . loratadine  10 mg Oral Daily  . methylPREDNISolone (SOLU-MEDROL) injection  40 mg Intravenous Q6H  . pantoprazole (PROTONIX) IV  40 mg Intravenous Q24H  . polyethylene glycol  17 g Oral Daily  . potassium chloride  20 mEq Oral Daily  . saccharomyces boulardii  250 mg Oral Daily  . simvastatin  20 mg Oral QHS  . sodium chloride  3 mL Intravenous Q12H   Infusions:  . sodium chloride 10 mL/hr at 06/02/13 1800  . sodium chloride 10 mL/hr at 06/02/13 1800  . sodium chloride    . feeding supplement (VITAL HIGH PROTEIN) 1,000 mL (06/03/13 2335)  . propofol 10 mcg/kg/min (06/03/13 2335)    Assessment: 68yo female admitted to [facility] w/ acute on chronic respiratory failure, worsened requiring intubation and tx to [facility] ICU, now w/ GPC in blood Cx, to begin IV ABX.  Goal of Therapy:  Vancomycin trough level 15-20 mcg/ml  Plan:  Will begin vancomycin 1000mg  IV Q8H and monitor CBC, Cx, levels prn.  Wynona Neat, PharmD, BCPS   06/04/2013,7:15 AM

## 2013-06-04 NOTE — Progress Notes (Signed)
NUTRITION FOLLOW UP  Intervention:   1.  Enteral nutrition; Continue Vital High Protein @ 50 mL/hr continuous with Prostat once daily to provide 1300 kcal, 120g protein, 1003 mL free water.  Nutrition Dx:   Inadequate oral intake, ongoing  Monitor:   1.  Enteral nutrition; initiation with tolerance.  Enteral nutrition to provide 60-70% of estimated calorie needs (22-25 kcals/kg ideal body weight) and 100% of estimated protein needs, based on ASPEN guidelines for permissive underfeeding in critically ill obese individuals. 2.  Wt/wt change; monitor trends  Assessment:   Pt with hx HTN, COPD on home O2, muscular dystrophy admitted to [facility] 1/14 with acute on chronic respiratory failure r/t AECOPD. Continued to have worsening hypercarbia despite bipap and was intubated and tx 1/17 to [facility]. POA present at bedside and reports pt from an assisted living facility where she was eating 3 meals/day on low sodium/chopped meats diet and had a stable weight.   Patient is currently remains ntubated on ventilator support.  MV: 11.0 L/min Temp (24hrs), Avg:99.2 F (37.3 C), Min:98.7 F (37.1 C), Max:99.6 F (37.6 C)  Propofol: none  Height: Ht Readings from Last 1 Encounters:  06/02/13 5\' 6"  (1.676 m)    Weight Status:   Wt Readings from Last 1 Encounters:  06/03/13 209 lb 7 oz (95 kg)    Re-estimated needs:  Kcal: 1874 Permissive underfeeding: 1300-1475 Protein: >118g Fluid: >1.8 L/day  Skin:  Non-pitting edema  Diet Order: NPO   Intake/Output Summary (Last 24 hours) at 06/04/13 1155 Last data filed at 06/04/13 1008  Gross per 24 hour  Intake   2035 ml  Output   2425 ml  Net   -390 ml    Last BM: 1/18   Labs:   Recent Labs Lab 06/02/13 0458 06/03/13 0500 06/04/13 0445  NA 129* 136* 141  K 4.2 3.6* 3.6*  CL 84* 92* 97  CO2 38* 30 32  BUN 15 22 24*  CREATININE 0.28* 0.29* 0.28*  CALCIUM 9.2 8.8 8.8  GLUCOSE 166* 154* 186*    CBG (last 3)   Recent  Labs  06/04/13 0118 06/04/13 0448 06/04/13 0807  GLUCAP 165* 169* 161*    Scheduled Meds: . antiseptic oral rinse  15 mL Mouth Rinse q12n4p  . baclofen  10 mg Oral BID  . carvedilol  6.25 mg Oral BID  . chlorhexidine  15 mL Mouth Rinse BID  . docusate  100 mg Oral QHS  . enoxaparin (LOVENOX) injection  40 mg Subcutaneous Q24H  . feeding supplement (PRO-STAT SUGAR FREE 64)  30 mL Per Tube Q1400  . furosemide  40 mg Intravenous Daily  . guaifenesin  400 mg Oral Q4H  . insulin aspart  0-9 Units Subcutaneous Q4H  . ipratropium-albuterol  3 mL Nebulization Q6H  . latanoprost  1 drop Both Eyes QHS  . levofloxacin (LEVAQUIN) IV  750 mg Intravenous Q24H  . loratadine  10 mg Oral Daily  . methylPREDNISolone (SOLU-MEDROL) injection  40 mg Intravenous Q6H  . pantoprazole (PROTONIX) IV  40 mg Intravenous Q24H  . polyethylene glycol  17 g Oral Daily  . potassium chloride  20 mEq Oral Daily  . saccharomyces boulardii  250 mg Oral Daily  . simvastatin  20 mg Oral QHS  . sodium chloride  3 mL Intravenous Q12H    Continuous Infusions: . sodium chloride 10 mL/hr at 06/02/13 1800  . sodium chloride 10 mL/hr at 06/02/13 1800  . sodium chloride    .  feeding supplement (VITAL HIGH PROTEIN) 1,000 mL (06/03/13 2335)  . propofol 5 mcg/kg/min (06/04/13 0700)    Brynda Greathouse, MS RD LDN Clinical Inpatient Dietitian Pager: [phone] Weekend/After hours pager: [phone]

## 2013-06-04 NOTE — Progress Notes (Signed)
Name: Karen Collier MRN: [id number] DOB: 1945-11-16    ADMISSION DATE:  06/26/2013 CONSULTATION DATE:  06/02/13  REFERRING MD :  Forestine Na  PRIMARY SERVICE: PCCM   CHIEF COMPLAINT:  Respiratory failure   BRIEF PATIENT DESCRIPTION: 68yo female with hx HTN, COPD on home O2, muscular dystrophy admitted to [facility] 1/14 with acute on chronic respiratory failure r/t AECOPD.  Continued to have worsening hypercarbia despite bipap and was intubated and tx 1/17 to [facility].   SIGNIFICANT EVENTS / STUDIES:  1/17>> worsening hypercarbia despite bipap, intubated, tx to [place] / TUBES: ETT 1/17>>> IJ CVL 1/17  CULTURES: BCx2 1/17>>>GPC clusters >> Urine 1/17>>> Sputum 1/17>>> Resp virus panel 1/17>>> 1/14 Flu swab neg  ANTIBIOTICS: Levaquin 1/14>>> vanc 1/19 >>  SUBJ - intubated Afebrile Denies pain Awake on low dose propofol gtt  VITAL SIGNS: Temp:  [99.3 F (37.4 C)-99.9 F (37.7 C)] 99.3 F (37.4 C) (01/19 0100) Pulse Rate:  [83-118] 89 (01/19 0600) Resp:  [16-30] 18 (01/19 0600) BP: (97-144)/(49-86) 126/56 mmHg (01/19 0600) SpO2:  [90 %-99 %] 97 % (01/19 0600) FiO2 (%):  [40 %] 40 % (01/19 0307) HEMODYNAMICS: cv stable   VENTILATOR SETTINGS: Vent Mode:  [-] PRVC FiO2 (%):  [40 %] 40 % Set Rate:  [16 bmp] 16 bmp Vt Set:  [500 mL] 500 mL PEEP:  [5 cmH20] 5 cmH20 Pressure Support:  [10 cmH20] 10 cmH20 Plateau Pressure:  [20 FBP10-25 cmH20] 21 cmH20 INTAKE / OUTPUT: Intake/Output     01/18 0701 - 01/19 0700 01/19 0701 - 01/20 0700   I.V. (mL/kg) 745.8 (7.9)    NG/GT 1435    IV Piggyback 100    Total Intake(mL/kg) 2280.8 (24)    Urine (mL/kg/hr) 2775 (1.2)    Total Output 2775     Net -494.2          Stool Occurrence 1 x      PHYSICAL EXAMINATION:  General:  Chronically ill appearing female, NAD on vent  Neuro:  Sedated on vent, RASS 0 HEENT:  Mm moist, ETT, no JVD  Cardiovascular:  s1s2 rrr Lungs:  resps even non labored on full vent  support, otherwise clear, no audible wheeze  Abdomen:  Soft, +bs Musculoskeletal:  Warm and dry, scant BLE edema    LABS:  CBC  Recent Labs Lab 05/31/13 0409 06/03/13 0500 06/04/13 0445  WBC 6.9 8.0 7.1  HGB 12.0 13.1 12.5  HCT 34.7* 37.7 37.3  PLT 147* 147* 127*   Coag's No results found for this basename: APTT, INR,  in the last 168 hours BMET  Recent Labs Lab 06/02/13 0458 06/03/13 0500 06/04/13 0445  NA 129* 136* 141  K 4.2 3.6* 3.6*  CL 84* 92* 97  CO2 38* 30 32  BUN 15 22 24*  CREATININE 0.28* 0.29* 0.28*  GLUCOSE 166* 154* 186*   Electrolytes  Recent Labs Lab 06/02/13 0458 06/03/13 0500 06/04/13 0445  CALCIUM 9.2 8.8 8.8   Sepsis Markers  Recent Labs Lab 22-Jun-2013 1023  LATICACIDVEN 1.5   ABG  Recent Labs Lab Jun 22, 2013 1015 06/02/13 0500 06/02/13 1118  PHART 7.397 7.179* 7.336*  PCO2ART 71.0* 107.0* 70.0*  PO2ART 65.6* 79.1* 86.0   Liver Enzymes  Recent Labs Lab 06/29/2013 1022  AST 27  ALT 24  ALKPHOS 105  BILITOT 0.4  ALBUMIN 3.6   Cardiac Enzymes  Recent Labs Lab Jun 22, 2013 1022  TROPONINI <0.30  PROBNP 103.2  Glucose  Recent Labs Lab 06/03/13 0507 06/03/13 1237 06/03/13 1626 06/03/13 2026 06/04/13 0118 06/04/13 0448  GLUCAP 158* 205* 140* 152* 165* 169*    Imaging Dg Chest Port 1 View  06/02/2013   CLINICAL DATA:  Central line, orogastric tube and endotracheal tube placement.  EXAM: PORTABLE CHEST - 1 VIEW  COMPARISON:  Earlier same date.  FINDINGS: 1225 hr. Endotracheal tube appears unchanged within the mid trachea. An orogastric tube projects below the diaphragm with the tip at the gastric fundus. There is a left IJ central venous catheter with its tip overlying the azygos region.  There are persistent low lung volumes with bibasilar atelectasis. No pneumothorax is demonstrated. The heart size and mediastinal contours are stable.  IMPRESSION: Central line and orogastric tube placement as described. No  pneumothorax. Stable bibasilar atelectasis.   Electronically Signed   By: Camie Patience M.D.   On: 06/02/2013 13:07   Dg Chest Port 1 View  06/02/2013   CLINICAL DATA:  Endotracheal tube placement  EXAM: PORTABLE CHEST - 1 VIEW  COMPARISON:  05/19/2013  FINDINGS: Endotracheal tube has been placed with tip 4 cm above the carina.There is hypoventilatory change bilaterally which appears slightly worse when compared to prior study.  IMPRESSION: Endotracheal tube with tip 4 cm above the carina   Electronically Signed   By: Skipper Cliche M.D.   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   Diaphragm paralysis   PULMONARY Acute on chronic respiratory failure - likely multifactorial in setting AECOPD, CHF, underlying muscular dystrophy +/- viral illness Severe atelectasis and elevated diaphragms c/w severe muscle weakness Hx of muscular dystrophy AECOPD  P:   Wean off propofol, RASS 0 SBTs CXR today  CARDIOVASCULAR Chronic diastolic CHF (echo 47/8295 -- EF 60-65%, PA peak pressure 18mmHg) Hx HTN  P:   Cont coreg, lasix  RENAL Hyponatremia - mild improved P:   F/u chem   GASTROINTESTINAL Protein calorie malnutrition - has been unable to eat x several days on bipap  P:   Cont TF  protonix for SUP   HEMATOLOGIC Thrombocytopenia - mild  P:  F/u cbc lovenox for DVT proph     INFECTIOUS AECOPD ?viral illness , flu neg FU GLC clusters  P:   hold tamiflu  Cont levaquin, add vanc  ENDOCRINE No active issue  P:   Trend glucose   NEUROLOGIC AMS - in setting hypercarbia  Hx of muscular dystrophy  P:   Monitor, supportive care  Daily WUA  Wean off propofol, use prn  fentanyl RASS 0 is goal   I have personally obtained a history, examined the patient, evaluated laboratory and imaging results, formulated the assessment and plan and placed orders. CRITICAL CARE: The patient is critically ill with multiple organ systems failure and requires high complexity decision making for assessment and support, frequent evaluation and titration of therapies, application of advanced monitoring technologies and extensive interpretation of multiple databases. Critical Care Time devoted to patient care services described in this note is 40 minutes.    [facility] V.MD Kara Mead MD. Shade Flood. [facility] Pager [phone] If no response call [phone]

## 2013-06-04 NOTE — Progress Notes (Addendum)
CSW called [facility], ALF that pt is from, and asked if there is any clinical information facility needs to be updated on pt's disposition to prepare for her return. Admissions rep states she would like the FL2 to look over, and CSW faxed this to her at [phone]. CSW following case and will facilitate discharge back to [facility] when medically appropriate.  Addendum: Rep with ALF called CSW asking why pt was placed on ventilator and explained that they have a female bed to take pt back but she cannot be on vent. CSW conferred with RNCM, who states pt had respiratory distress and was placed on vent and we will have to see how she does after a few days and if she is able to wean back off vent.  Ky Barban, MSW, [facility] Clinical Social Worker [phone]

## 2013-06-05 ENCOUNTER — Inpatient Hospital Stay (HOSPITAL_COMMUNITY): Payer: Medicare Other

## 2013-06-05 LAB — CBC
HCT: 38.8 % (ref 36.0–46.0)
Hemoglobin: 12.8 g/dL (ref 12.0–15.0)
MCH: 29.5 pg (ref 26.0–34.0)
MCHC: 33 g/dL (ref 30.0–36.0)
MCV: 89.4 fL (ref 78.0–100.0)
PLATELETS: 126 10*3/uL — AB (ref 150–400)
RBC: 4.34 MIL/uL (ref 3.87–5.11)
RDW: 14 % (ref 11.5–15.5)
WBC: 8.7 10*3/uL (ref 4.0–10.5)

## 2013-06-05 LAB — GLUCOSE, CAPILLARY
GLUCOSE-CAPILLARY: 154 mg/dL — AB (ref 70–99)
GLUCOSE-CAPILLARY: 113 mg/dL — AB (ref 70–99)
Glucose-Capillary: 110 mg/dL — ABNORMAL HIGH (ref 70–99)
Glucose-Capillary: 140 mg/dL — ABNORMAL HIGH (ref 70–99)
Glucose-Capillary: 165 mg/dL — ABNORMAL HIGH (ref 70–99)
Glucose-Capillary: 201 mg/dL — ABNORMAL HIGH (ref 70–99)

## 2013-06-05 LAB — BASIC METABOLIC PANEL
BUN: 31 mg/dL — AB (ref 6–23)
CALCIUM: 9.3 mg/dL (ref 8.4–10.5)
CO2: 32 mEq/L (ref 19–32)
CREATININE: 0.27 mg/dL — AB (ref 0.50–1.10)
Chloride: 99 mEq/L (ref 96–112)
GFR calc non Af Amer: >90 mL/min (ref >90)
Glucose, Bld: 181 mg/dL — ABNORMAL HIGH (ref 70–99)
Potassium: 3.7 mEq/L (ref 3.7–5.3)
Sodium: 142 mEq/L (ref 137–147)

## 2013-06-05 LAB — TRIGLYCERIDES: Triglycerides: 93 mg/dL (ref <150)

## 2013-06-05 MED ORDER — METHYLPREDNISOLONE SODIUM SUCC 40 MG IJ SOLR
40.0000 mg | Freq: Every day | INTRAMUSCULAR | Status: DC
  Administered 2013-06-06: 40 mg via INTRAVENOUS
  Filled 2013-06-05: qty 1

## 2013-06-05 MED ORDER — PANTOPRAZOLE SODIUM 40 MG PO PACK
40.0000 mg | PACK | Freq: Every day | ORAL | Status: DC
  Administered 2013-06-05: 40 mg
  Filled 2013-06-05 (×2): qty 20

## 2013-06-05 MED ORDER — MORPHINE SULFATE 2 MG/ML IJ SOLN
1.0000 mg | INTRAMUSCULAR | Status: DC | PRN
  Administered 2013-06-05 – 2013-06-07 (×4): 2 mg via INTRAVENOUS
  Filled 2013-06-05 (×5): qty 1

## 2013-06-05 NOTE — Progress Notes (Signed)
NUTRITION FOLLOW UP  Intervention:   1.  Modify diet; resume PO diet once medically appropriate per MD discretion.  Nutrition Dx:   Inadequate oral intake, ongoing  Monitor:   1.  Enteral nutrition; initiation with tolerance.  Enteral nutrition to provide 60-70% of estimated calorie needs (22-25 kcals/kg ideal body weight) and 100% of estimated protein needs, based on ASPEN guidelines for permissive underfeeding in critically ill obese individuals. No longer appropriate.  2.  Wt/wt change; monitor trends.  Ongoing.  3.  Food/Beverage; diet advancement with pt meeting >/=90% estimated needs with tolerance.   Assessment:   Pt with hx HTN, COPD on home O2, muscular dystrophy admitted to [facility] 1/14 with acute on chronic respiratory failure r/t AECOPD. Continued to have worsening hypercarbia despite bipap and was intubated and tx 1/17 to [facility]. POA present at bedside and reports pt from an assisted living facility where she was eating 3 meals/day on low sodium/chopped meats diet and had a stable weight.   Pt has been extubated.  She remains NPO s/p extubation.  For PO trials this afternoon.  TFs have been discontinued.  RD to follow for ongoing interventions as warranted.   Height: Ht Readings from Last 1 Encounters:  06/02/13 5\' 6"  (1.676 m)    Weight Status:   Wt Readings from Last 1 Encounters:  06/03/13 209 lb 7 oz (95 kg)    Re-estimated needs:  Kcal: 1740-1860 Protein: 78-89g Fluid: >1.8 L/day  Skin:  Non-pitting edema  Diet Order: NPO   Intake/Output Summary (Last 24 hours) at 06/05/13 1508 Last data filed at 06/05/13 1301  Gross per 24 hour  Intake   1715 ml  Output   1800 ml  Net    -85 ml    Last BM: 1/18   Labs:   Recent Labs Lab 06/03/13 0500 06/04/13 0445 06/05/13 0420  NA 136* 141 142  K 3.6* 3.6* 3.7  CL 92* 97 99  CO2 30 32 32  BUN 22 24* 31*  CREATININE 0.29* 0.28* 0.27*  CALCIUM 8.8 8.8 9.3  GLUCOSE 154* 186* 181*    CBG  (last 3)   Recent Labs  06/05/13 0422 06/05/13 0750 06/05/13 1236  GLUCAP 165* 201* 154*    Scheduled Meds: . antiseptic oral rinse  15 mL Mouth Rinse q12n4p  . carvedilol  6.25 mg Oral BID  . chlorhexidine  15 mL Mouth Rinse BID  . docusate  100 mg Oral QHS  . furosemide  40 mg Intravenous Daily  . guaifenesin  400 mg Oral Q4H  . insulin aspart  0-9 Units Subcutaneous Q4H  . ipratropium-albuterol  3 mL Nebulization Q6H  . latanoprost  1 drop Both Eyes QHS  . levofloxacin (LEVAQUIN) IV  750 mg Intravenous Q24H  . loratadine  10 mg Oral Daily  . [START ON 06/06/2013] methylPREDNISolone (SOLU-MEDROL) injection  40 mg Intravenous Daily  . pantoprazole sodium  40 mg Per Tube Daily  . polyethylene glycol  17 g Oral Daily  . potassium chloride  20 mEq Oral Daily  . saccharomyces boulardii  250 mg Oral Daily  . simvastatin  20 mg Oral QHS  . sodium chloride  3 mL Intravenous Q12H    Continuous Infusions: . sodium chloride 10 mL/hr at 06/02/13 1800  . sodium chloride 10 mL/hr at 06/02/13 1800  . sodium chloride    . propofol 5 mcg/kg/min (06/04/13 0700)    Brynda Greathouse, MS RD LDN Clinical Inpatient Dietitian Pager: [phone] Weekend/After  hours pager: [phone]

## 2013-06-05 NOTE — Progress Notes (Signed)
06/05/2013 10:25- Resp care - pt suctioned and extubated to 5lpm [place] HR- 124, RR-26, SAT-93, B/P- 172/108. PT tolerated extubation well with PT vocalizing post extubation. Vent on standby at bedside along with ambu bag. Will monitor progress and wean as tolerated.

## 2013-06-05 NOTE — Progress Notes (Signed)
PCCM Progress Note   Name: Karen Collier MRN: [id number] DOB: 1946-02-21    ADMISSION DATE:  06/16/2013 CONSULTATION DATE:  06/02/13  REFERRING MD :  Forestine Na  PRIMARY SERVICE: PCCM   CHIEF COMPLAINT:  Respiratory failure   BRIEF PATIENT DESCRIPTION: 68yo female with hx HTN,never smoker on home O2, muscular dystrophy admitted to [facility] 1/14 with acute on chronic respiratory failure.  Continued to have worsening hypercarbia despite bipap and was intubated and tx 1/17 to [facility].   SIGNIFICANT EVENTS / STUDIES:  1/17>> worsening hypercarbia despite bipap, intubated, tx to [facility]  1/18>> hypotension sbps 70s-80s while in deep sleep on Propofol   LINES / TUBES: ETT 1/17>>> piv  CULTURES: BCx2 1/17>>>Coag neg Staph 1 of 2 bottles Urine 1/17>>>ngtd Sputum 1/17>>>ngtd Resp virus panel 1/17>>>  ANTIBIOTICS: Levaquin 1/14>>>  HISTORY OF PRESENT ILLNESS:  68yo female with hx HTN, on home O2, muscular dystrophy presented 1/14 to [facility] with increased SOB, productive cough, chills, increased BD use at home. Admitted to [facility] and rx for AECOPD with BD, steroids, Bipap.  Cont to worsen with increased CO2 despite bipap, requiring intubation and was tx to [facility] 1/17.   SUBJ - denies pain Dyspnea improved Afebrile  VITAL SIGNS: Temp:  [98 F (36.7 C)-99.5 F (37.5 C)] 99.5 F (37.5 C) (01/20 0751) Pulse Rate:  [80-116] 116 (01/20 0800) Resp:  [14-23] 22 (01/20 0800) BP: (118-163)/(29-96) 143/59 mmHg (01/20 0800) SpO2:  [92 %-99 %] 99 % (01/20 0800) FiO2 (%):  [40 %] 40 % (01/20 0415)  HEMODYNAMICS:   VENTILATOR SETTINGS: Vent Mode:  [-] PRVC FiO2 (%):  [40 %] 40 % Set Rate:  [14 bmp] 14 bmp Vt Set:  [500 mL] 500 mL PEEP:  [5 cmH20] 5 cmH20 Pressure Support:  [15 cmH20] 15 cmH20 Plateau Pressure:  [19 cmH20-20 cmH20] 20 cmH20  INTAKE / OUTPUT: Intake/Output     01/19 0701 - 01/20 0700 01/20 0701 - 01/21 0700   I.V. (mL/kg) 385.9 (4.1) 20 (0.2)   NG/GT  1525 80   IV Piggyback 400    Total Intake(mL/kg) 2310.9 (24.3) 100 (1.1)   Urine (mL/kg/hr) 2450 (1.1)    Total Output 2450     Net -139.1 +100          PHYSICAL EXAMINATION:  General: on vent, NAD Neuro:  Alert, good eye contact, responds appropriately and cooperative HEENT:  +ETT, EOMI Cardiovascular:  RRR no murmurs appreciated Lungs:  resps even non labored on full vent support, right base with decreased air movement, no wheezes appreciated Abdomen:  Soft, normoactive bowel sounds, no tenderness, no distension Musculoskeletal:  Warm and dry, trace BLE non-pitting edema, strong hand grip, able to lift legs against gravity, cannot ambulate secondary to muscular dystrophy   LABS:  CBC  Recent Labs Lab 06/03/13 0500 06/04/13 0445 06/05/13 0420  WBC 8.0 7.1 8.7  HGB 13.1 12.5 12.8  HCT 37.7 37.3 38.8  PLT 147* 127* 126*   BMET  Recent Labs Lab 06/03/13 0500 06/04/13 0445 06/05/13 0420  NA 136* 141 142  K 3.6* 3.6* 3.7  CL 92* 97 99  CO2 30 32 32  BUN 22 24* 31*  CREATININE 0.29* 0.28* 0.27*  GLUCOSE 154* 186* 181*   Electrolytes  Recent Labs Lab 06/03/13 0500 06/04/13 0445 06/05/13 0420  CALCIUM 8.8 8.8 9.3   Sepsis Markers  Recent Labs Lab 06/09/2013 1023  LATICACIDVEN 1.5   ABG  Recent Labs Lab 06/02/13 0500  06/02/13 1118 06/04/13 1148  PHART 7.179* 7.336* 7.440  PCO2ART 107.0* 70.0* 60.1*  PO2ART 79.1* 86.0 82.0   Liver Enzymes  Recent Labs Lab 06/03/2013 1022  AST 27  ALT 24  ALKPHOS 105  BILITOT 0.4  ALBUMIN 3.6   Cardiac Enzymes  Recent Labs Lab 05/23/2013 1022  TROPONINI <0.30  PROBNP 103.2   Glucose  Recent Labs Lab 06/04/13 1245 06/04/13 1704 06/04/13 2015 06/04/13 2310 06/05/13 0422 06/05/13 0750  GLUCAP 157* 154* 146* 173* 165* 201*    Imaging Dg Chest Port 1 View  06/05/2013   CLINICAL DATA:  Endotracheal tube positioning.  EXAM: PORTABLE CHEST - 1 VIEW  COMPARISON:  DG CHEST 1V PORT dated 06/04/2013; DG  CHEST 1V PORT dated 06/02/2013; DG CHEST 1V PORT dated 06/02/2013; DG CHEST 1V PORT dated 05/22/2013; DG CHEST 2 VIEW dated 03/26/2013  FINDINGS: Difficult patient positioning noted -2 images were obtained.  Endotracheal tube tip is 3.8 cm above the carina. Left IJ line tip: SVC. Nasogastric tube tip: Stomach body.  Low lung volumes are observed with mild bibasilar atelectasis or scarring. Heart size remains within normal limits.  IMPRESSION: 1. Support apparatus satisfactorily positioned. ET tube tip 3.8 cm above the carina. 2. Mild bibasilar atelectasis or scarring. 3. Low lung volumes.   Electronically Signed   By: Sherryl Barters M.D.   On: 06/05/2013 07:34   Dg Chest Port 1 View  06/04/2013   CLINICAL DATA:  Bibasilar atelectasis  EXAM: PORTABLE CHEST - 1 VIEW  COMPARISON:  06/02/2013  FINDINGS: Endotracheal tube, NG tube, left internal jugular central venous catheter are stable. Stable bibasilar pulmonary opacities. Low volumes. No pneumothorax. Upper normal heart size.  IMPRESSION: Stable bibasilar pulmonary opacities.   Electronically Signed   By: Maryclare Bean M.D.   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   Diaphragm paralysis   PULMONARY Acute on chronic respiratory failure - likely multifactorial, underlying muscular dystrophy +/- viral illness, and CHF Severe atelectasis and elevated diaphragms c/w severe muscle weakness Hx of muscular dystrophy with diaphragm paralysis Contraction alkalosis in setting diuresis -->significant respiratory acidosis resolved  P:   Extubate today without BiPap given pt does not desire re-intubation Start morphine if pain/air hunger Flu swab ---> neg Decrease solumedrol to 40 q  24 Cont Levaquin -->Day #6 Levaquin no COPD as pt is a never smoker Pt confirmed that she does not want long term vent support   Recent Labs Lab 05/20/2013 1015 06/02/13 0500 06/02/13 1118 06/04/13 1148  PHART 7.397 7.179* 7.336* 7.440  PCO2ART 71.0* 107.0* 70.0* 60.1*  PO2ART 65.6* 79.1* 86.0 82.0  HCO3 42.8* 38.3* 37.1* 40.8*  TCO2 38.4 35.8 39 43  O2SAT 92.0 94.4 95.0 96.0     CARDIOVASCULAR Chronic diastolic CHF (echo [id number] -- EF 60-65%, PA peak pressure 18mmHg) without exacerbation, pBNP 103 s/p diamox and lasix therapy Hx HTN with episodes of hypotension this admission in setting of Propofol, Lasix and Coreg P:   Cont coreg,  Cont lasix   RENAL Mild Hyponatremia - resolved P:   Cont to monitor   Recent Labs Lab 06/01/13 0427 06/02/13 0458 06/03/13 0500 06/04/13 0445 06/05/13 0420  NA 133* 129* 136* 141 142    GASTROINTESTINAL Protein calorie malnutrition -  P:   Cont TF initiated 1/17 Cont protonix for SUP   HEMATOLOGIC Thrombocytopenia - mild, stable  P:  Cont Trend platelets Cont lovenox for DVT proph    Recent Labs Lab 06/14/2013 1022 05/31/13 0409 06/03/13 0500 06/04/13 0445 06/05/13 0420  PLT 145* 147* 147* 127* 126*     INFECTIOUS ?viral illness, no leukocytosis Levaquin Day #6  coag neg Staph (1 out 2 bottles) likely contaminant P:   Flu swab --> neg Cont levaquin Vanc d/c  Recent Labs Lab 05/24/2013 1022 05/31/13 0409 06/03/13 0500 06/04/13 0445 06/05/13 0420  WBC 8.3 6.9 8.0 7.1 8.7    ENDOCRINE No active issue  P:   Trend glucose  CBG (last 3)   Recent Labs  06/04/13 2310 06/05/13 0422 06/05/13 0750  GLUCAP 173* 165* 201*    NEUROLOGIC AMS - in setting hypercarbia now resolved Hx of muscular dystrophy  P:   Monitor, cont supportive care  Dc baclofen  Disposition: Code status to DNR, pt endorsed desire not to be further intubated nor re-intubated, friends/family at beside confirms pt's  desire.  Dorian Heckle, MD Internal Medicine Resident, PGY3    Care during the described time interval was provided by me and/or other providers on the critical care team.  I have reviewed this patient's available data, including medical history, events of note, physical examination and test results as part of my evaluation  CC time x 35 m  Reda Gettis V.  06/05/2013  8:48 AM

## 2013-06-06 DIAGNOSIS — D649 Anemia, unspecified: Secondary | ICD-10-CM

## 2013-06-06 DIAGNOSIS — I5031 Acute diastolic (congestive) heart failure: Secondary | ICD-10-CM

## 2013-06-06 DIAGNOSIS — I509 Heart failure, unspecified: Secondary | ICD-10-CM

## 2013-06-06 LAB — GLUCOSE, CAPILLARY
Glucose-Capillary: 110 mg/dL — ABNORMAL HIGH (ref 70–99)
Glucose-Capillary: 106 mg/dL — ABNORMAL HIGH (ref 70–99)
Glucose-Capillary: 160 mg/dL — ABNORMAL HIGH (ref 70–99)

## 2013-06-06 MED ORDER — PANTOPRAZOLE SODIUM 40 MG PO TBEC
40.0000 mg | DELAYED_RELEASE_TABLET | Freq: Every day | ORAL | Status: DC
  Administered 2013-06-06 – 2013-06-09 (×4): 40 mg via ORAL
  Filled 2013-06-06 (×4): qty 1

## 2013-06-06 MED ORDER — MIDAZOLAM HCL 2 MG/2ML IJ SOLN
0.5000 mg | Freq: Once | INTRAMUSCULAR | Status: AC
  Administered 2013-06-06: 0.5 mg via INTRAVENOUS

## 2013-06-06 MED ORDER — MIDAZOLAM HCL 2 MG/2ML IJ SOLN
INTRAMUSCULAR | Status: AC
  Administered 2013-06-06: 0.5 mg via INTRAVENOUS
  Filled 2013-06-06: qty 2

## 2013-06-06 MED ORDER — PREDNISONE 20 MG PO TABS
40.0000 mg | ORAL_TABLET | Freq: Every day | ORAL | Status: DC
  Administered 2013-06-07 – 2013-06-09 (×3): 40 mg via ORAL
  Filled 2013-06-06 (×5): qty 2

## 2013-06-06 MED ORDER — ZOLPIDEM TARTRATE 5 MG PO TABS: 5.0000 mg | ORAL_TABLET | Freq: Once | ORAL | Status: DC

## 2013-06-06 MED ORDER — INSULIN ASPART 100 UNIT/ML ~~LOC~~ SOLN: 0.0000 [IU] | Freq: Three times a day (TID) | SUBCUTANEOUS | Status: DC

## 2013-06-06 MED ORDER — INSULIN ASPART 100 UNIT/ML ~~LOC~~ SOLN
0.0000 [IU] | Freq: Three times a day (TID) | SUBCUTANEOUS | Status: DC
  Administered 2013-06-06: 2 [IU] via SUBCUTANEOUS
  Administered 2013-06-07: 1 [IU] via SUBCUTANEOUS
  Administered 2013-06-07: 2 [IU] via SUBCUTANEOUS
  Administered 2013-06-07: 1 [IU] via SUBCUTANEOUS
  Administered 2013-06-08: 2 [IU] via SUBCUTANEOUS
  Administered 2013-06-08 – 2013-06-09 (×3): 1 [IU] via SUBCUTANEOUS
  Administered 2013-06-09: 3 [IU] via SUBCUTANEOUS

## 2013-06-06 NOTE — Evaluation (Signed)
Clinical/Bedside Swallow Evaluation Patient Details  Name: CLAYTON JARMON MRN: [id number] Date of Birth: Oct 15, 1945  Today's Date: 06/06/2013 Time: 2952-8413 SLP Time Calculation (min): 10 min  Past Medical History:  Past Medical History  Diagnosis Date  . Muscular dystrophy   . COPD (chronic obstructive pulmonary disease)   . Asthma   . Hypoxemia   . CHF (congestive heart failure)   . Chronic pain   . Hypertension   . Glaucoma   . Muscle weakness   . Chronic respiratory failure with hypercapnia     And hypoxia  . Atelectasis 11/21/2011  . Lung nodule 11/21/2011  . Thyroid nodule 11/21/2011  . Compensated alkalosis 11/21/2011  . Pneumonia 11/21/2011  . Hyponatremia Recurrent    Normalizes with normal saline  . GERD (gastroesophageal reflux disease)   . Acute diastolic CHF (congestive heart failure) 03/28/2013   Past Surgical History:  Past Surgical History  Procedure Laterality Date  . Cholecystectomy    . Abdominal hysterectomy    . Colonoscopy N/A 12/07/2012    KGM:[id number] lipoma; otherwise negative examination  . Esophagogastroduodenoscopy N/A 12/07/2012    DGU:[id number] polyps. Hiatal hernia. Status post gastric bx, chronic gastritis noted.    HPI:  68 yo female with hx HTN,never smoker on home O2, muscular dystrophy admitted to [facility] 1/14 with acute on chronic respiratory failure.  Continued to have worsening hypercarbia despite bipap and was intubated and tx 1/17 to [facility]. Now extubated and DNR.  ETT 1/17>>>1/20.  Severe atelectasis and elevated diaphragms c/w severe muscle weakness. Prior bedside swallow eval at [facility] 7/13 with normal findings.   Assessment / Plan / Recommendation Clinical Impression  Pt presents with functional oropharyngeal swallow, marked by adequate mastication, swift swallow trigger, and no s/s of aspiration.  Voice is breathy, cough is weak, however, pt appears to be protecting her airway.  Recommend resuming a regular diet with thin  liquids, meds whole with liquid.  Pt needs assist with tray set-up, cutting meats but otherwise can feed herself.  No SLP intervention warranted - will sign off.     Aspiration Risk  Mild    Diet Recommendation Regular;Thin liquid   Liquid Administration via: Straw Medication Administration: Whole meds with liquid Supervision: Patient able to self feed    Other  Recommendations Oral Care Recommendations: Oral care BID   Follow Up Recommendations  None      Swallow Study Prior Functional Status       General Date of Onset: 06-02-13  Type of Study: Bedside swallow evaluation Previous Swallow Assessment: clinical swallow eval at [facility] 11/22/11 with normal findings and recs for regular diet. Diet Prior to this Study: NPO Temperature Spikes Noted: No Respiratory Status: Nasal cannula History of Recent Intubation: Yes Length of Intubations (days): 3 days Date extubated: 06/05/13 Behavior/Cognition: Alert;Cooperative Oral Cavity - Dentition: Adequate natural dentition Self-Feeding Abilities: Able to feed self;Needs set up Patient Positioning: Upright in bed Baseline Vocal Quality: Low vocal intensity;Breathy Volitional Cough: Weak Volitional Swallow: Able to elicit    Oral/Motor/Sensory Function Overall Oral Motor/Sensory Function: Appears within functional limits for tasks assessed   Ice Chips Ice chips: Within functional limits Presentation: Self Fed   Thin Liquid Thin Liquid: Within functional limits Presentation: Straw;Self Fed    Nectar Thick Nectar Thick Liquid: Not tested   Honey Thick Honey Thick Liquid: Not tested   Puree Puree: Within functional limits Presentation: Self Fed   Solid   GO    Solid: Within  functional limits      Mariela Rex L. Tivis Ringer, [place] CCC/SLP Pager [phone]  Juan Quam Laurice 06/06/2013,2:37 PM

## 2013-06-06 NOTE — Progress Notes (Signed)
Report to [facility] on [address].  Patient to be transported via bed with Oxygen, meds, & personal belongings.

## 2013-06-06 NOTE — Progress Notes (Signed)
Pt now off ventilator and on nasal cannula at 3 L/min. Pt is from [facility], and CSW has informed ALF that pt is now on nasal cannula and is no longer on vent. CSW explained pt is doing better and per MD note, pt is stable and transferring to med floor soon. CSW explained discharge is imminent and CSW wanted to update facility. Pt to discharge back to [facility] when medically ready.   Ky Barban, MSW, [facility] Clinical Social Worker [phone]

## 2013-06-06 NOTE — Progress Notes (Signed)
PCCM Progress Note   Name: Karen Collier MRN: [id number] DOB: 1946-01-28    ADMISSION DATE:  05/25/2013 CONSULTATION DATE:  06/02/13  REFERRING MD :  Forestine Na  PRIMARY SERVICE: PCCM   CHIEF COMPLAINT:  Respiratory failure   BRIEF PATIENT DESCRIPTION: 68yo female with hx HTN,never smoker on home O2, muscular dystrophy [facility] 1/14 with acute on chronic respiratory failure.  Continued to have worsening hypercarbia despite bipap and was intubated and tx 1/17 to [facility]. Now extubated and DNR.  SIGNIFICANT EVENTS / STUDIES:  1/17>> worsening hypercarbia despite bipap, intubated, tx to [facility]  1/18>> hypotension sbps 70s-80s while in deep sleep on Propofol  1/20>> Extubated.  LINES / TUBES: ETT 1/17>>>1/20 L IJ CVC  1/17>>  CULTURES: BCx2 1/17>>>Coag neg Staph 1 of 2 bottles Urine 1/17>>>ngtd Sputum 1/17>>>ngtd Resp virus panel 1/17>>> Neg flu a/b, neg H1N1  ANTIBIOTICS: Levaquin 1/14>>>  HISTORY OF PRESENT ILLNESS:  68yo female with hx HTN, on home O2, muscular dystrophy presented 1/14 to [facility] with increased SOB, productive cough, chills, increased BD use at home. Admitted to [facility] and rx for AECOPD with BD, steroids, Bipap.  Cont to worsen with increased CO2 despite bipap, requiring intubation and was tx to [facility] 1/17. Extubated 1/20, now DNR.  SUBJ - Pt alert and pleasant sitting up in bed.  C/o neck pain but other wise ok.  VITAL SIGNS: Temp:  [97.9 F (36.6 C)-99.3 F (37.4 C)] 98.4 F (36.9 C) (01/21 0800) Pulse Rate:  [103-129] 111 (01/21 0800) Resp:  [17-30] 23 (01/21 0800) BP: (137-187)/(56-151) 147/68 mmHg (01/21 0800) SpO2:  [92 %-98 %] 96 % (01/21 0800) FiO2 (%):  [40 %] 40 % (01/20 0905) Weight:  [95.1 kg (209 lb 10.5 oz)] 95.1 kg (209 lb 10.5 oz) (01/21 0500)  HEMODYNAMICS:   VENTILATOR SETTINGS: Vent Mode:  [-] CPAP;PSV FiO2 (%):  [40 %] 40 % PEEP:  [5 cmH20] 5 cmH20 Pressure Support:  [5 cmH20] 5 cmH20  INTAKE /  OUTPUT: Intake/Output     01/20 0701 - 01/21 0700 01/21 0701 - 01/22 0700   P.O. 30    I.V. (mL/kg) 250 (2.6) 10 (0.1)   NG/GT 190    IV Piggyback 150    Total Intake(mL/kg) 620 (6.5) 10 (0.1)   Urine (mL/kg/hr) 1700 (0.7)    Total Output 1700     Net -1080 +10          PHYSICAL EXAMINATION:  General: Obese female, NAD Neuro:  Alert, good eye contact, responds appropriately and cooperative HEENT:  PERRL, EOMI Cardiovascular:  RRR no m/g/r Lungs:  resps even non labored on full vent support, right base with decreased air movement, no wheezes appreciated Abdomen:  Soft, normoactive bowel sounds, no tenderness, no distension Musculoskeletal:  Warm and dry, trace BLE non-pitting edema, strong hand grip, able to lift legs against gravity, cannot ambulate secondary to muscular dystrophy   LABS:  CBC  Recent Labs Lab 06/03/13 0500 06/04/13 0445 06/05/13 0420  WBC 8.0 7.1 8.7  HGB 13.1 12.5 12.8  HCT 37.7 37.3 38.8  PLT 147* 127* 126*   BMET  Recent Labs Lab 06/03/13 0500 06/04/13 0445 06/05/13 0420  NA 136* 141 142  K 3.6* 3.6* 3.7  CL 92* 97 99  CO2 30 32 32  BUN 22 24* 31*  CREATININE 0.29* 0.28* 0.27*  GLUCOSE 154* 186* 181*   Electrolytes  Recent Labs Lab 06/03/13 0500 06/04/13 0445 06/05/13 0420  CALCIUM 8.8 8.8 9.3   Sepsis Markers  Recent Labs Lab 05/23/2013 1023  LATICACIDVEN 1.5   ABG  Recent Labs Lab 06/02/13 0500 06/02/13 1118 06/04/13 1148  PHART 7.179* 7.336* 7.440  PCO2ART 107.0* 70.0* 60.1*  PO2ART 79.1* 86.0 82.0   Liver Enzymes  Recent Labs Lab 06/09/2013 1022  AST 27  ALT 24  ALKPHOS 105  BILITOT 0.4  ALBUMIN 3.6   Cardiac Enzymes  Recent Labs Lab 06/15/2013 1022  TROPONINI <0.30  PROBNP 103.2   Glucose  Recent Labs Lab 06/05/13 1236 06/05/13 1721 06/05/13 1927 06/05/13 2345 06/06/13 0414 06/06/13 0818  GLUCAP 154* 113* 140* 110* 110* 106*    Imaging Dg Chest Port 1 View  06/05/2013   CLINICAL DATA:   Endotracheal tube positioning.  EXAM: PORTABLE CHEST - 1 VIEW  COMPARISON:  DG CHEST 1V PORT dated 06/04/2013; DG CHEST 1V PORT dated 06/02/2013; DG CHEST 1V PORT dated 06/02/2013; DG CHEST 1V PORT dated 06/09/2013; DG CHEST 2 VIEW dated 03/26/2013  FINDINGS: Difficult patient positioning noted -2 images were obtained.  Endotracheal tube tip is 3.8 cm above the carina. Left IJ line tip: SVC. Nasogastric tube tip: Stomach body.  Low lung volumes are observed with mild bibasilar atelectasis or scarring. Heart size remains within normal limits.  IMPRESSION: 1. Support apparatus satisfactorily positioned. ET tube tip 3.8 cm above the carina. 2. Mild bibasilar atelectasis or scarring. 3. Low lung volumes.   Electronically Signed   By: Sherryl Barters M.D.   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 Pt on 2L at home. Goal SpO2 >92% Decrease solumedrol further Cont Levaquin -->Day #7 Levaquin  No COPD as pt is a never smoker Pt does not want long term vent support IS , upright important  CARDIOVASCULAR Chronic diastolic CHF (echo 63/8466 -- EF 60-65%, PA peak pressure 66mmHg) without exacerbation, pBNP 103 s/p diamox and lasix therapy Hx HTN with episodes of hypotension this admission in setting of Propofol, Lasix and Coreg P:   Cont coreg  Cont lasix to neg 1 liter goal  RENAL A: In need neg balance P:   Cont to monitor, daily lasix Chem in  am  GASTROINTESTINAL Protein calorie malnutrition  SUP. P:   Attempt POs today. Begin heart healthy diet as tolerated. Cont protonix for SUP   HEMATOLOGIC Thrombocytopenia - mild, stable  P:  Limit phlebotomy Cont lovenox for DVT proph    INFECTIOUS ?viral illness, no leukocytosis Levaquin Day #6  coag neg Staph (1 out 2 bottles) likely contaminant P:   Flu swab --> neg Cont levaquin x 7 days then dc  ENDOCRINE No active issues. P:   Trend glucose   NEUROLOGIC AMS - in setting hypercarbia now resolved Hx of muscular dystrophy P:   Monitor, cont supportive care  Limit roids in setting MD Pt consult  Disposition: Code status to DNR, pt endorsed desire not to be further intubated nor re-intubated, friends/family at beside confirms pt's desire.  Today's Summary: Pt stable, begin POs  today, increase activity level, transfer to med floor, to triad  Karen Loll PA-S  06/06/2013  8:53 AM   I have fully examined this patient and agree with above findings.    And edited in full  Karen Paganini. Titus Mould, MD, [place] Pgr: [facility]

## 2013-06-06 NOTE — Progress Notes (Signed)
[place] Progress Note Patient Name: LAKASHA MCFALL DOB: 1946/03/17 MRN: [id number]  Date of Service  06/06/2013   HPI/Events of Note   Insomnia  eICU Interventions  Ambien 5 mg PO x1.      Amyrah Pinkhasov 06/06/2013, 9:07 PM

## 2013-06-06 NOTE — Progress Notes (Signed)
IV paged for new IV placement;

## 2013-06-06 NOTE — Progress Notes (Signed)
Transferred via bed with Oxygen in place to [place] room 8.  VSS. No distress noted.

## 2013-06-07 DIAGNOSIS — E43 Unspecified severe protein-calorie malnutrition: Secondary | ICD-10-CM | POA: Diagnosis present

## 2013-06-07 LAB — GLUCOSE, CAPILLARY
GLUCOSE-CAPILLARY: 126 mg/dL — AB (ref 70–99)
Glucose-Capillary: 151 mg/dL — ABNORMAL HIGH (ref 70–99)
Glucose-Capillary: 139 mg/dL — ABNORMAL HIGH (ref 70–99)
Glucose-Capillary: 150 mg/dL — ABNORMAL HIGH (ref 70–99)
Glucose-Capillary: 153 mg/dL — ABNORMAL HIGH (ref 70–99)

## 2013-06-07 LAB — PHOSPHORUS: Phosphorus: 3.5 mg/dL (ref 2.3–4.6)

## 2013-06-07 LAB — BASIC METABOLIC PANEL
BUN: 24 mg/dL — ABNORMAL HIGH (ref 6–23)
CALCIUM: 8.9 mg/dL (ref 8.4–10.5)
CHLORIDE: 97 meq/L (ref 96–112)
CO2: 33 meq/L — AB (ref 19–32)
CREATININE: 0.22 mg/dL — AB (ref 0.50–1.10)
GFR calc non Af Amer: >90 mL/min (ref >90)
Glucose, Bld: 137 mg/dL — ABNORMAL HIGH (ref 70–99)
Potassium: 4.7 mEq/L (ref 3.7–5.3)
SODIUM: 141 meq/L (ref 137–147)

## 2013-06-07 LAB — MAGNESIUM: Magnesium: 2.3 mg/dL (ref 1.5–2.5)

## 2013-06-07 MED ORDER — ZOLPIDEM TARTRATE 5 MG PO TABS: 5.0000 mg | ORAL_TABLET | Freq: Every evening | ORAL | Status: DC | PRN

## 2013-06-07 MED ORDER — FUROSEMIDE 20 MG PO TABS
20.0000 mg | ORAL_TABLET | Freq: Two times a day (BID) | ORAL | Status: DC
  Administered 2013-06-07 – 2013-06-09 (×6): 20 mg via ORAL
  Filled 2013-06-07 (×9): qty 1

## 2013-06-07 NOTE — Evaluation (Signed)
Physical Therapy Evaluation Patient Details Name: Karen Collier MRN: [id number] DOB: Jun 09, 1945 Today's Date: 06/07/2013 Time: 0730-0755 PT Time Calculation (min): 25 min  PT Assessment / Plan / Recommendation History of Present Illness  68yo female with hx HTN, COPD on home O2, muscular dystrophy admitted to [facility] 1/14 with acute on chronic respiratory failure r/t AECOPD.  Continued to have worsening hypercarbia despite bipap and was intubated and tx 1/17 to [facility].   Clinical Impression  Pt presents with significant weakness and deficits in mobility, requiring total A for transfers and max A for bed mobility at this time.  Pt will benefit from skilled PT services to address deficits and lessen burden of care for safe d/c to SNF.    PT Assessment  Patient needs continued PT services    Follow Up Recommendations  SNF    Does the patient have the potential to tolerate intense rehabilitation      Barriers to Discharge        Equipment Recommendations  None recommended by PT    Recommendations for Other Services OT consult   Frequency Min 3X/week    Precautions / Restrictions Precautions Precautions: Fall Restrictions Weight Bearing Restrictions: No   Pertinent Vitals/Pain No c/o pain, pt on 2L O2 throughout treatment.  spO2 80% after transfers, quickly returned to >90% with < 30 seconds seated rest      Mobility  Bed Mobility Overal bed mobility: Needs Assistance Bed Mobility: Supine to Sit Supine to sit: Max assist General bed mobility comments: assist for LEs and trunk, cues for sequencing Transfers Overall transfer level: Needs assistance Transfers: Lateral/Scoot Transfers  Lateral/Scoot Transfers: Total assist General transfer comment: attempted squat pivot transfer to recliner, pt unable to lift bottom off of bed with total A.  pt able to perform scooting transfer bed to recliner with total A. pt with difficulty helping with UEs, able to bear some weight  in LEs    Exercises     PT Diagnosis: Generalized weakness  PT Problem List: Decreased strength;Decreased mobility;Decreased safety awareness;Decreased activity tolerance;Decreased balance;Decreased knowledge of use of DME PT Treatment Interventions: DME instruction;Therapeutic exercise;Wheelchair mobility training;Gait training;Balance training;Neuromuscular re-education;Modalities;Cognitive remediation;Functional mobility training;Patient/family education;Therapeutic activities     PT Goals(Current goals can be found in the care plan section) Acute Rehab PT Goals Patient Stated Goal: none stated PT Goal Formulation: Patient unable to participate in goal setting Time For Goal Achievement: 06/14/13 Potential to Achieve Goals: Good  Visit Information  Last PT Received On: 06/07/13 Assistance Needed: +2 History of Present Illness: 68yo female with hx HTN, COPD on home O2, muscular dystrophy admitted to [facility] 1/14 with acute on chronic respiratory failure r/t AECOPD.  Continued to have worsening hypercarbia despite bipap and was intubated and tx 1/17 to [facility].        Prior [place] expects to be discharged to:: Assisted living Home Equipment: Hospital bed;Wheelchair - manual;Bedside commode Prior Function Level of Independence: Needs assistance Comments: pt is a poor historian, not able to tell PT prior level of function.  per chart pt living at ALF    Cognition  Cognition Arousal/Alertness: Lethargic Behavior During Therapy: WFL for tasks assessed/performed Overall Cognitive Status: No family/caregiver present to determine baseline cognitive functioning    Extremity/Trunk Assessment Upper Extremity Assessment Upper Extremity Assessment: Generalized weakness Lower Extremity Assessment Lower Extremity Assessment: Generalized weakness Cervical / Trunk Assessment Cervical / Trunk Assessment: Kyphotic   Balance Balance Overall balance  assessment: Needs  assistance Sitting-balance support: Bilateral upper extremity supported Sitting balance-Leahy Scale: Poor Sitting balance - Comments: requires mod A to sit edge of bed due to posterior lean Postural control: Posterior lean  End of Session PT - End of Session Equipment Utilized During Treatment: Gait belt Activity Tolerance: Patient limited by fatigue Patient left: in chair;with call bell/phone within reach Nurse Communication: Mobility status  GP     Ronav Furney 06/07/2013, 9:33 AM

## 2013-06-07 NOTE — Progress Notes (Signed)
[facility] PROGRESS NOTE Interim History: 68yo female with hx HTN,never smoker on home O2, muscular dystrophy admitted to [facility] 1/14 with acute on chronic respiratory failure. Continued to have worsening hypercarbia despite bipap and was intubated and tx 1/17 to [facility].   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 Cont solumedrol tapered. - las day of levaquin. - coag neg Staph (1 out 2 bottles) likely contaminant.   Chronic diastolic CHF (echo [id number] -- EF 60-65%, PA peak pressure 76mmHg) without exacerbation, pBNP 103: - s/p diamox and lasix therapy - Cont coreg, change lasix to orals. - monitor electrolytes.  Protein caloric malnutrition: - heart healthy diet as tolerated - ensure TID.  Thrombocytopenia  - mild, stable    Code Status: full ( Family Communication: d/w patient, DPOA at the bedside Disposition Plan: pend clinical improvement   Consultants:  PCCM  Procedures: 1/17>> worsening hypercarbia despite bipap, intubated, tx to [facility]  1/18>> hypotension sbps 70s-80s while in deep sleep on Propofol  1/20>> Extubated. LINES / TUBES:  ETT 1/17>>>1/20  L IJ CVC 1/17>> BCx2 1/17>>>Coag neg Staph 1 of 2 bottles  Urine 1/17>>>ngtd  Sputum 1/17>>>ngtd  Resp virus panel 1/17>>> Neg flu a/b, neg H1N1     Antibiotics: Levaquin 1/14>>>   HPI/Subjective: No compalins  Objective: Filed Vitals:   06/07/13 0252 06/07/13 0415 06/07/13 0536 06/07/13 0801  BP:   151/70   Pulse:   104   Temp:   98.4 F (36.9 C)   TempSrc:   Oral   Resp:   20   Height:      Weight:  93.895 kg (207 lb)    SpO2: 95%  96% 92%    Intake/Output Summary (Last 24 hours) at 06/07/13 0837 Last data filed at 06/07/13 0414  Gross per 24 hour  Intake    353 ml  Output   2750 ml  Net  -2397 ml   Filed Weights    06/06/13 0500 06/06/13 2110 06/07/13 0415  Weight: 95.1 kg (209 lb 10.5 oz) 93.668 kg (206 lb 8 oz) 93.895 kg (207 lb)    Exam:  General: Alert, awake, oriented x3, in no acute distress.  HEENT: No bruits, no goiter.  Heart: Regular rate and rhythm, without murmurs, rubs, gallops.  Lungs: Good air movement,CTA B/L Abdomen: Soft, nontender, nondistended, positive bowel sounds.     Data Reviewed: Basic Metabolic Panel:  Recent Labs Lab 06/02/13 0458 06/03/13 0500 06/04/13 0445 06/05/13 0420 06/07/13 0345  NA 129* 136* 141 142 141  K 4.2 3.6* 3.6* 3.7 4.7  CL 84* 92* 97 99 97  CO2 38* 30 32 32 33*  GLUCOSE 166* 154* 186* 181* 137*  BUN 15 22 24* 31* 24*  CREATININE 0.28* 0.29* 0.28* 0.27* 0.22*  CALCIUM 9.2 8.8 8.8 9.3 8.9  MG  --   --   --   --  2.3  PHOS  --   --   --   --  3.5   Liver Function Tests: No results found for this basename: AST, ALT, ALKPHOS, BILITOT, PROT, ALBUMIN,  in the last 168 hours No results found for this basename: LIPASE, AMYLASE,  in the last 168 hours No results found for this basename: AMMONIA,  in the last 168 hours CBC:  Recent Labs Lab 06/03/13 0500 06/04/13 0445 06/05/13 0420  WBC 8.0 7.1 8.7  HGB 13.1 12.5 12.8  HCT 37.7 37.3 38.8  MCV 86.1 88.4 89.4  PLT 147* 127* 126*   Cardiac Enzymes: No results found for this basename: CKTOTAL, CKMB, CKMBINDEX, TROPONINI,  in the last 168 hours BNP (last 3 results)  Recent Labs  03/26/13 0534 06/04/2013 1022  PROBNP 85.2 103.2   CBG:  Recent Labs Lab 06/05/13 2345 06/06/13 0414 06/06/13 0818 06/06/13 1200 06/06/13 1603  GLUCAP 110* 110* 106* 160* 151*    Recent Results (from the past 240 hour(s))  MRSA PCR SCREENING     Status: None   Collection Time    05/28/2013  1:50 PM      Result Value Range Status   MRSA by PCR NEGATIVE  NEGATIVE Final   Comment:            The GeneXpert MRSA Assay (FDA     approved for NASAL specimens     only), is one component of a      comprehensive MRSA colonization     surveillance program. It is not     intended to diagnose MRSA     infection nor to guide or     monitor treatment for     MRSA infections.  MRSA PCR SCREENING     Status: None   Collection Time    06/02/13 10:41 AM      Result Value Range Status   MRSA by PCR NEGATIVE  NEGATIVE Final   Comment:            The GeneXpert MRSA Assay (FDA     approved for NASAL specimens     only), is one component of a     comprehensive MRSA colonization     surveillance program. It is not     intended to diagnose MRSA     infection nor to guide or     monitor treatment for     MRSA infections.  CULTURE, BLOOD (ROUTINE X 2)     Status: None   Collection Time    06/02/13  1:40 PM      Result Value Range Status   Specimen Description BLOOD LEFT ARM   Final   Special Requests BOTTLES DRAWN AEROBIC ONLY 2CC   Final   Culture  Setup Time     Final   Value: 06/02/2013 17:12     Performed at [facility]   Culture     Final   Value:        BLOOD CULTURE RECEIVED NO GROWTH TO DATE CULTURE WILL BE HELD FOR 5 DAYS BEFORE ISSUING A FINAL NEGATIVE REPORT     Performed at [facility]   Report Status PENDING   Incomplete  CULTURE, BLOOD (ROUTINE X 2)     Status: None   Collection Time    06/02/13  1:50 PM      Result Value Range Status   Specimen Description BLOOD LEFT HAND   Final   Special Requests BOTTLES DRAWN AEROBIC ONLY 0.5CC   Final   Culture  Setup Time     Final   Value: 06/02/2013 17:11     Performed at [facility]   Culture     Final   Value: STAPHYLOCOCCUS SPECIES (COAGULASE NEGATIVE)     Note: THE SIGNIFICANCE OF ISOLATING THIS ORGANISM FROM A SINGLE SET OF BLOOD CULTURES WHEN MULTIPLE SETS ARE DRAWN IS UNCERTAIN. PLEASE NOTIFY THE MICROBIOLOGY DEPARTMENT WITHIN ONE WEEK IF SPECIATION AND SENSITIVITIES ARE REQUIRED.     Note:  Gram Stain Report Called to,Read Back By and Verified With: Monia Sabal RN @ [phone] 06/03/13 BY KRAWS      Performed at [facility]   Report Status 06/04/2013 FINAL   Final     Studies: No results found.  Scheduled Meds: . antiseptic oral rinse  15 mL Mouth Rinse q12n4p  . carvedilol  6.25 mg Oral BID  . chlorhexidine  15 mL Mouth Rinse BID  . docusate  100 mg Oral QHS  . furosemide  40 mg Intravenous Daily  . guaifenesin  400 mg Oral Q4H  . insulin aspart  0-9 Units Subcutaneous TID WC  . ipratropium-albuterol  3 mL Nebulization Q6H  . latanoprost  1 drop Both Eyes QHS  . levofloxacin (LEVAQUIN) IV  750 mg Intravenous Q24H  . loratadine  10 mg Oral Daily  . pantoprazole  40 mg Oral Daily  . polyethylene glycol  17 g Oral Daily  . potassium chloride  20 mEq Oral Daily  . predniSONE  40 mg Oral Q breakfast  . saccharomyces boulardii  250 mg Oral Daily  . simvastatin  20 mg Oral QHS  . sodium chloride  3 mL Intravenous Q12H  . zolpidem  5 mg Oral Once   Continuous Infusions: . sodium chloride 10 mL/hr at 06/02/13 1800  . sodium chloride Stopped (06/06/13 1600)  . sodium chloride    . propofol 5 mcg/kg/min (06/04/13 0700)     Charlynne Cousins  [facility] Pager [phone]. If 8PM-8AM, please contact night-coverage at [email or web address], password [facility] 06/07/2013, 8:37 AM  LOS: 8 days

## 2013-06-07 NOTE — Progress Notes (Signed)
UR completed Sivan Cuello K. Jaline Pincock, RN, BSN, MSHL, CCM  06/07/2013 3:11 PM

## 2013-06-07 NOTE — Progress Notes (Signed)
Patient expresses multiple concerns pertaining to not being informed of transfer to another floor.  It was explained to patient how she was in ICU and is now on the floor.  She is not fully comprehending and asking multiple times why she is here.  Will continue to monitor patient.

## 2013-06-08 LAB — GLUCOSE, CAPILLARY
GLUCOSE-CAPILLARY: 102 mg/dL — AB (ref 70–99)
GLUCOSE-CAPILLARY: 150 mg/dL — AB (ref 70–99)
Glucose-Capillary: 168 mg/dL — ABNORMAL HIGH (ref 70–99)
Glucose-Capillary: 136 mg/dL — ABNORMAL HIGH (ref 70–99)

## 2013-06-08 LAB — CULTURE, BLOOD (ROUTINE X 2): CULTURE: NO GROWTH

## 2013-06-08 MED ORDER — LORAZEPAM 1 MG PO TABS: 1.0000 mg | ORAL_TABLET | Freq: Two times a day (BID) | ORAL | Status: AC

## 2013-06-08 MED ORDER — ZOLPIDEM TARTRATE 5 MG PO TABS: 5.0000 mg | ORAL_TABLET | Freq: Every evening | ORAL | Status: AC | PRN

## 2013-06-08 NOTE — Progress Notes (Signed)
Patient had no complaints the entire shift. Was able to sleep well. Vital signs stable.

## 2013-06-08 NOTE — Progress Notes (Signed)
Bed offer received from [facility] which was on of patient and friend's choices for placement.  CSW contacted [facility]- authorization is pending and is currently being processed.  CSW attempted to have auth expedited but was unsuccessful.  Patient cannot return to ALF due to health issues at this time;  She does not have any family that she can go stay with.  She has a friend but she is not related and has no responsibility towards patient's care nor could she manage her safely at home.  Hopefully will receive [employer] number on Monday.  Lorie [address]. [address], [place]

## 2013-06-08 NOTE — Discharge Summary (Signed)
Physician Discharge Summary  Karen Collier [id number] DOB: 07-20-1945 DOA: 05/25/2013  PCP: Alonza Bogus, MD  Admit date: 06/09/2013 Discharge date: 06/08/2013  Time spent: 35 minutes  Recommendations for Outpatient Follow-up:  1. Follow up with PCP in 2-3 week.  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 Continued to have worsening hypercarbia despite bipap and was intubated and tx 1/17 to [facility].    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 - required intubation on 1.17, extubated 1.20. - started on IV solumedrol when intubated and tapering steroids as an outpatient. - Treated empirically for HCAP which she completed treatment in house. - coag neg Staph (1 out 2 bottles) likely contaminant.   Chronic diastolic CHF (echo [id number] -- EF 60-65%, PA peak pressure 74mmHg) without exacerbation, pBNP 103:  - s/p diamox and lasix therapy  - Cont coreg, change lasix to orals.  - monitor electrolytes.  - no changed made.  Protein caloric malnutrition:  - heart healthy diet as tolerated  - ensure TID.    Thrombocytopenia  - mild, stable    Procedures: /17>> worsening hypercarbia despite bipap, intubated, tx to [facility]  1/18>> hypotension sbps 70s-80s while in deep sleep on Propofol  1/20>> Extubated.  LINES / TUBES:  ETT 1/17>>>1/20  L IJ CVC 1/17>>  BCx2 1/17>>>Coag neg Staph 1 of 2 bottles  Urine 1/17>>>ngtd  Sputum 1/17>>>ngtd  Resp virus panel 1/17>>> Neg flu a/b, neg H1N1   Levaquin 1/14>>> 1.22.2014   Consultations:  PCCM  Discharge Exam: Filed Vitals:   06/08/13 0445  BP: 147/89  Pulse: 100  Temp: 98 F (36.7 C)  Resp: 20    General: A&O x3 Cardiovascular: RR Respiratory: good air movement CTA B/L  Discharge Instructions      Discharge Orders   Future Orders Complete By Expires   Diet - low sodium heart healthy  As directed    Increase activity slowly  As directed        Medication List    STOP taking these medications       naproxen 500 MG tablet  Commonly known as:  NAPROSYN      TAKE these medications       acetaminophen 500 MG tablet  Commonly known as:  TYLENOL  Take 500 mg by mouth every 6 (six) hours as needed for mild pain.     albuterol (2.5 MG/3ML) 0.083% nebulizer solution  Commonly known as:  PROVENTIL  Take 2.5 mg by nebulization 3 (three) times daily as needed for shortness of breath.     ALIGN 4 MG Caps  Take 1 capsule by mouth daily.     baclofen 10 MG tablet  Commonly known  as:  LIORESAL  Take 10 mg by mouth 2 (two) times daily.     CALCIUM 600 + D PO  Take 1 tablet by mouth daily.     carvedilol 6.25 MG tablet  Commonly known as:  COREG  Take 1 tablet (6.25 mg total) by mouth 2 (two) times daily.     DAILY VITE PO  Take 1 tablet by mouth daily.     docusate sodium 100 MG capsule  Commonly known as:  COLACE  Take 100 mg by mouth at bedtime.     fexofenadine 180 MG tablet  Commonly known as:  ALLEGRA  Take 180 mg by mouth daily.     fluticasone 50 MCG/ACT nasal spray  Commonly known as:  FLONASE  Place  2 sprays into both nostrils daily.     furosemide 20 MG tablet  Commonly known as:  LASIX  Take 2 tablets (40 mg total) by mouth 2 (two) times daily.     latanoprost 0.005 % ophthalmic solution  Commonly known as:  XALATAN  Place 1 drop into both eyes at bedtime.     liver oil-zinc oxide 40 % ointment  Commonly known as:  DESITIN  Apply 1 application topically as needed for irritation (apply to affected areas as needed for irritation).     LORazepam 1 MG tablet  Commonly known as:  ATIVAN  Take 1 tablet (1 mg total) by mouth 2 (two) times daily.     meclizine 25 MG tablet  Commonly known as:  ANTIVERT  Take 25 mg by mouth daily as needed for dizziness.     metolazone 2.5 MG tablet  Commonly known as:  ZAROXOLYN  Take 1 tablet by mouth as directed. Take 30 minutes before taking Lasix on Monday, Wednesday, and Friday.     NASAL RELIEF 0.05 % nasal spray  Generic drug:  oxymetazoline  Place 2 sprays into both nostrils every 6 (six) hours as needed for congestion.     omeprazole 20 MG capsule  Commonly known as:  PRILOSEC  Take 1 capsule (20 mg total) by mouth daily.     Polyethyl Glycol-Propyl Glycol 0.4-0.3 % Soln  Apply 1 drop to eye as needed (instill 1-2 drops in each eye as needed for dry eyes).     polyethylene glycol powder powder  Commonly known as:  GLYCOLAX/MIRALAX  Take 17 g by mouth daily. As needed for a bowel movement daily.     potassium chloride SA 20 MEQ tablet  Commonly known as:  K-DUR,KLOR-CON  Take 20 mEq by mouth 2 (two) times daily.     simethicone 80 MG chewable tablet  Commonly known as:  MYLICON  Chew 80 mg by mouth every 6 (six) hours as needed for flatulence.     simvastatin 20 MG tablet  Commonly known as:  ZOCOR  Take 20 mg by mouth at bedtime.     SPIRIVA HANDIHALER 18 MCG inhalation capsule  Generic drug:  tiotropium  Place 18 mcg into inhaler and inhale daily.     zolpidem 5 MG tablet  Commonly known as:  AMBIEN  Take 1 tablet  (5 mg total) by mouth at bedtime as needed for sleep.       No Known Allergies Follow-up Information   Follow up with HAWKINS,EDWARD L, MD In 3 weeks. (hospital follow up)    Specialty:  Pulmonary Disease   Contact information:   [place] [address] [phone]  The results of significant diagnostics from this hospitalization (including imaging, microbiology, ancillary and laboratory) are listed below for reference.    Significant Diagnostic Studies: Dg Chest Port 1 View  06/05/2013   CLINICAL DATA:  Endotracheal tube positioning.  EXAM: PORTABLE CHEST - 1 VIEW  COMPARISON:  DG CHEST 1V PORT dated 06/04/2013; DG CHEST 1V PORT dated 06/02/2013; DG CHEST 1V PORT dated 06/02/2013; DG CHEST 1V PORT dated 06/02/2013; DG CHEST 2 VIEW dated 03/26/2013  FINDINGS: Difficult patient positioning noted -2 images were obtained.  Endotracheal tube tip is 3.8 cm above the carina. Left IJ line tip: SVC. Nasogastric tube tip: Stomach body.  Low lung volumes are observed with mild bibasilar atelectasis or scarring. Heart size remains within normal limits.  IMPRESSION: 1. Support apparatus satisfactorily positioned. ET tube tip 3.8 cm above the carina. 2. Mild bibasilar atelectasis or scarring. 3. Low lung volumes.   Electronically Signed   By: Sherryl Barters M.D.   On: 06/05/2013 07:34   Dg Chest Port 1 View  06/04/2013   CLINICAL DATA:  Bibasilar atelectasis  EXAM: PORTABLE CHEST - 1 VIEW  COMPARISON:  06/02/2013  FINDINGS: Endotracheal tube, NG tube, left internal jugular central venous catheter are stable. Stable bibasilar pulmonary opacities. Low volumes. No pneumothorax. Upper normal heart size.  IMPRESSION: Stable bibasilar pulmonary opacities.   Electronically Signed   By: Maryclare Bean M.D.   On: 06/04/2013 08:10   Dg Chest Port 1 View  06/02/2013   CLINICAL DATA:  Central line, orogastric tube and endotracheal tube placement.  EXAM: PORTABLE CHEST - 1 VIEW   COMPARISON:  Earlier same date.  FINDINGS: 1225 hr. Endotracheal tube appears unchanged within the mid trachea. An orogastric tube projects below the diaphragm with the tip at the gastric fundus. There is a left IJ central venous catheter with its tip overlying the azygos region.  There are persistent low lung volumes with bibasilar atelectasis. No pneumothorax is demonstrated. The heart size and mediastinal contours are stable.  IMPRESSION: Central line and orogastric tube placement as described. No pneumothorax. Stable bibasilar atelectasis.   Electronically Signed   By: Camie Patience M.D.   On: 06/02/2013 13:07   Dg Chest Port 1 View  06/02/2013   CLINICAL DATA:  Endotracheal tube placement  EXAM: PORTABLE CHEST - 1 VIEW  COMPARISON:  06/03/2013  FINDINGS: Endotracheal tube has been placed with tip 4 cm above the carina.There is hypoventilatory change bilaterally which appears slightly worse when compared to prior study.  IMPRESSION: Endotracheal tube with tip 4 cm above the carina   Electronically Signed   By: Skipper Cliche M.D.   On: 06/02/2013 09:03   Dg Chest Portable 1 View  05/29/2013   CLINICAL DATA:  Cough and congestion for 4 days, history COPD, asthma, hypertension, CHF  EXAM: PORTABLE CHEST - 1 VIEW  COMPARISON:  Portable exam 1006 hr compared to 03/26/2013  FINDINGS: Very low lung volumes.  Right apex obscured by patient's chin.  Normal heart size and mediastinal contours.  Bibasilar atelectasis.  No definite acute infiltrate or pleural effusion.  No gross pneumothorax or acute osseous findings.  IMPRESSION: Low lung volumes with bibasilar atelectasis.   Electronically Signed   By: Lavonia Dana M.D.   On: 05/29/2013 10:15    Microbiology: Recent Results (from the past 240 hour(s))  MRSA PCR SCREENING     Status: None   Collection Time    05/28/2013  1:50 PM      Result Value  Range Status   MRSA by PCR NEGATIVE  NEGATIVE Final   Comment:            The GeneXpert MRSA Assay (FDA      approved for NASAL specimens     only), is one component of a     comprehensive MRSA colonization     surveillance program. It is not     intended to diagnose MRSA     infection nor to guide or     monitor treatment for     MRSA infections.  MRSA PCR SCREENING     Status: None   Collection Time    06/02/13 10:41 AM      Result Value Range Status   MRSA by PCR NEGATIVE  NEGATIVE Final   Comment:            The GeneXpert MRSA Assay (FDA     approved for NASAL specimens     only), is one component of a     comprehensive MRSA colonization     surveillance program. It is not     intended to diagnose MRSA     infection nor to guide or     monitor treatment for     MRSA infections.  CULTURE, BLOOD (ROUTINE X 2)     Status: None   Collection Time    06/02/13  1:40 PM      Result Value Range Status   Specimen Description BLOOD LEFT ARM   Final   Special Requests BOTTLES DRAWN AEROBIC ONLY 2CC   Final   Culture  Setup Time     Final   Value: 06/02/2013 17:12     Performed at [facility]   Culture     Final   Value:        BLOOD CULTURE RECEIVED NO GROWTH TO DATE CULTURE WILL BE HELD FOR 5 DAYS BEFORE ISSUING A FINAL NEGATIVE REPORT     Performed at [facility]   Report Status PENDING   Incomplete  CULTURE, BLOOD (ROUTINE X 2)     Status: None   Collection Time    06/02/13  1:50 PM      Result Value Range Status   Specimen Description BLOOD LEFT HAND   Final   Special Requests BOTTLES DRAWN AEROBIC ONLY 0.5CC   Final   Culture  Setup Time     Final   Value: 06/02/2013 17:11     Performed at [facility]   Culture     Final   Value: STAPHYLOCOCCUS SPECIES (COAGULASE NEGATIVE)     Note: THE SIGNIFICANCE OF ISOLATING THIS ORGANISM FROM A SINGLE SET OF BLOOD CULTURES WHEN MULTIPLE SETS ARE DRAWN IS UNCERTAIN. PLEASE NOTIFY THE MICROBIOLOGY DEPARTMENT WITHIN ONE WEEK IF SPECIATION AND SENSITIVITIES ARE REQUIRED.     Note: Gram Stain Report Called to,Read Back  By and Verified With: GIGI Stann Mainland RN @ [phone] 06/03/13 BY KRAWS     Performed at [facility]   Report Status 06/04/2013 FINAL   Final     Labs: Basic Metabolic Panel:  Recent Labs Lab 06/02/13 0458 06/03/13 0500 06/04/13 0445 06/05/13 0420 06/07/13 0345  NA 129* 136* 141 142 141  K 4.2 3.6* 3.6* 3.7 4.7  CL 84* 92* 97 99 97  CO2 38* 30 32 32 33*  GLUCOSE 166* 154* 186* 181* 137*  BUN 15 22 24* 31* 24*  CREATININE 0.28* 0.29* 0.28* 0.27* 0.22*  CALCIUM 9.2 8.8  8.8 9.3 8.9  MG  --   --   --   --  2.3  PHOS  --   --   --   --  3.5   Liver Function Tests: No results found for this basename: AST, ALT, ALKPHOS, BILITOT, PROT, ALBUMIN,  in the last 168 hours No results found for this basename: LIPASE, AMYLASE,  in the last 168 hours No results found for this basename: AMMONIA,  in the last 168 hours CBC:  Recent Labs Lab 06/03/13 0500 06/04/13 0445 06/05/13 0420  WBC 8.0 7.1 8.7  HGB 13.1 12.5 12.8  HCT 37.7 37.3 38.8  MCV 86.1 88.4 89.4  PLT 147* 127* 126*   Cardiac Enzymes: No results found for this basename: CKTOTAL, CKMB, CKMBINDEX, TROPONINI,  in the last 168 hours BNP: BNP (last 3 results)  Recent Labs  03/26/13 0534 05/20/2013 1022  PROBNP 85.2 103.2   CBG:  Recent Labs Lab 06/06/13 2140 06/07/13 0720 06/07/13 1256 06/07/13 1634 06/08/13 0621  GLUCAP 139* 126* 150* 153* 102*       Signed:  Aileen Fass, Lyris Hitchman  [facility] 06/08/2013, 8:20 AM

## 2013-06-08 NOTE — Clinical Social Work Placement (Addendum)
    Clinical Social Work Department CLINICAL SOCIAL WORK PLACEMENT NOTE 06/08/2013  Patient:  Karen, Collier  Account Number:  [id number] Admit date:  06/02/2013  Clinical Social Worker:  Butch Penny Latysha Thackston, LCSWA  Date/time:  06/07/2013 01:42 PM  Clinical Social Work is seeking post-discharge placement for this patient at the following level of care:   SKILLED NURSING   (*CSW will update this form in Epic as items are completed)   06/07/2013  Patient/family provided with [facility] [place] Department of Clinical Social Work's list of facilities offering this level of care within the geographic area requested by the patient (or if unable, by the patient's family).  06/07/2013  Patient/family informed of their freedom to choose among providers that offer the needed level of care, that participate in Medicare, Medicaid or managed care program needed by the patient, have an available bed and are willing to accept the patient.  06/07/2013  Patient/family informed of [facility]' ownership interest in [facility], as well as of the fact that they are under no obligation to receive care at this facility.  PASARR submitted to EDS on 06/08/2013 PASARR number received from [employer] on 06/08/2013  FL2 transmitted to all facilities in geographic area requested by pt/family on  06/07/2013 FL2 transmitted to all facilities within larger geographic area on   Patient informed that his/her managed care company has contracts with or will negotiate with  certain facilities, including the following:   [employer]- auth sent 06/07/13     Patient/family informed of bed offers received:   Patient chooses bed at  Physician recommends and patient chooses bed at    Patient to be transferred to  on   Patient to be transferred to facility by [facility])  The following physician request were entered in Epic:   Additional Comments:

## 2013-06-08 NOTE — Progress Notes (Signed)
Up in chair x 4 hours.  2 person assist to chair and back to bed.  Tolerated well

## 2013-06-09 LAB — GLUCOSE, CAPILLARY
GLUCOSE-CAPILLARY: 113 mg/dL — AB (ref 70–99)
Glucose-Capillary: 122 mg/dL — ABNORMAL HIGH (ref 70–99)
Glucose-Capillary: 125 mg/dL — ABNORMAL HIGH (ref 70–99)
Glucose-Capillary: 171 mg/dL — ABNORMAL HIGH (ref 70–99)

## 2013-06-09 NOTE — Progress Notes (Signed)
[facility] PROGRESS NOTE Interim History: 68yo female with hx HTN,never smoker on home O2, muscular dystrophy admitted to [facility] 1/14 with acute on chronic respiratory failure. Continued to have worsening hypercarbia despite bipap and was intubated and tx 1/17 to [facility].   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 Cont solumedrol tapered. - completed course of levaquin. - coag neg Staph (1 out 2 bottles) likely contaminant.   Chronic diastolic CHF (echo [id number] -- EF 60-65%, PA peak pressure 28mmHg) without exacerbation, pBNP 103: - s/p diamox and lasix therapy - Cont coreg, change lasix to orals. - monitor electrolytes.  Protein caloric malnutrition: - heart healthy diet as tolerated - ensure TID.  Thrombocytopenia  - mild, stable    Code Status: full ( Family Communication: d/w patient, DPOA at the bedside Disposition Plan: pend clinical improvement   Consultants:  PCCM  Procedures: 1/17>> worsening hypercarbia despite bipap, intubated, tx to [facility]  1/18>> hypotension sbps 70s-80s while in deep sleep on Propofol  1/20>> Extubated. LINES / TUBES:  ETT 1/17>>>1/20  L IJ CVC 1/17>> BCx2 1/17>>>Coag neg Staph 1 of 2 bottles  Urine 1/17>>>ngtd  Sputum 1/17>>>ngtd  Resp virus panel 1/17>>> Neg flu a/b, neg H1N1     Antibiotics: Levaquin 1/14>>>1.23.2014   HPI/Subjective: No compalins  Objective: Filed Vitals:   06/08/13 1953 06/08/13 2220 06/09/13 0320 06/09/13 0532  BP: 137/55 142/68 147/53 141/64  Pulse: 103 100 98 95  Temp: 99.1 F (37.3 C) 99 F (37.2 C) 97.5 F (36.4 C) 97.9 F (36.6 C)  TempSrc: Oral Oral Oral Oral  Resp: 20 22 20 20   Height:      Weight:    97.659 kg (215 lb 4.8 oz)  SpO2: 100% 100% 96% 98%    Intake/Output Summary (Last 24 hours) at 06/09/13  0742 Last data filed at 06/09/13 0648  Gross per 24 hour  Intake    843 ml  Output    650 ml  Net    193 ml   Filed Weights   06/07/13 0415 06/08/13 0445 06/09/13 0532  Weight: 93.895 kg (207 lb) 94.756 kg (208 lb 14.4 oz) 97.659 kg (215 lb 4.8 oz)    Exam:  General: Alert, awake, oriented x3, in no acute distress.  HEENT: No bruits, no goiter.  Heart: Regular rate and rhythm, without murmurs, rubs, gallops.  Lungs: Good air movement,CTA B/L Abdomen: Soft, nontender, nondistended, positive bowel sounds.     Data Reviewed: Basic Metabolic Panel:  Recent Labs Lab 06/03/13 0500 06/04/13 0445 06/05/13 0420 06/07/13 0345  NA 136* 141 142 141  K 3.6* 3.6* 3.7 4.7  CL 92* 97 99 97  CO2 30 32 32 33*  GLUCOSE 154* 186* 181* 137*  BUN 22 24* 31* 24*  CREATININE 0.29* 0.28* 0.27* 0.22*  CALCIUM 8.8 8.8 9.3 8.9  MG  --   --   --  2.3  PHOS  --   --   --  3.5   Liver Function Tests: No results found for this basename: AST, ALT, ALKPHOS, BILITOT, PROT, ALBUMIN,  in the last 168 hours No results found for this basename: LIPASE, AMYLASE,  in the last 168 hours No results found for this basename: AMMONIA,  in the last 168 hours CBC:  Recent Labs Lab 06/03/13 0500 06/04/13 0445 06/05/13 0420  WBC 8.0 7.1 8.7  HGB 13.1 12.5 12.8  HCT 37.7  37.3 38.8  MCV 86.1 88.4 89.4  PLT 147* 127* 126*   Cardiac Enzymes: No results found for this basename: CKTOTAL, CKMB, CKMBINDEX, TROPONINI,  in the last 168 hours BNP (last 3 results)  Recent Labs  03/26/13 0534 06/09/2013 1022  PROBNP 85.2 103.2   CBG:  Recent Labs Lab 06/08/13 1130 06/08/13 1639 06/08/13 2258 06/09/13 0320 06/09/13 0538  GLUCAP 168* 150* 136* 113* 122*    Recent Results (from the past 240 hour(s))  MRSA PCR SCREENING     Status: None   Collection Time    05/22/2013  1:50 PM      Result Value Range Status   MRSA by PCR NEGATIVE  NEGATIVE Final   Comment:            The GeneXpert MRSA Assay (FDA      approved for NASAL specimens     only), is one component of a     comprehensive MRSA colonization     surveillance program. It is not     intended to diagnose MRSA     infection nor to guide or     monitor treatment for     MRSA infections.  MRSA PCR SCREENING     Status: None   Collection Time    06/02/13 10:41 AM      Result Value Range Status   MRSA by PCR NEGATIVE  NEGATIVE Final   Comment:            The GeneXpert MRSA Assay (FDA     approved for NASAL specimens     only), is one component of a     comprehensive MRSA colonization     surveillance program. It is not     intended to diagnose MRSA     infection nor to guide or     monitor treatment for     MRSA infections.  CULTURE, BLOOD (ROUTINE X 2)     Status: None   Collection Time    06/02/13  1:40 PM      Result Value Range Status   Specimen Description BLOOD LEFT ARM   Final   Special Requests BOTTLES DRAWN AEROBIC ONLY 2CC   Final   Culture  Setup Time     Final   Value: 06/02/2013 17:12     Performed at [facility]   Culture     Final   Value: NO GROWTH 5 DAYS     Performed at [facility]   Report Status 06/08/2013 FINAL   Final  CULTURE, BLOOD (ROUTINE X 2)     Status: None   Collection Time    06/02/13  1:50 PM      Result Value Range Status   Specimen Description BLOOD LEFT HAND   Final   Special Requests BOTTLES DRAWN AEROBIC ONLY 0.5CC   Final   Culture  Setup Time     Final   Value: 06/02/2013 17:11     Performed at [facility]   Culture     Final   Value: STAPHYLOCOCCUS SPECIES (COAGULASE NEGATIVE)     Note: THE SIGNIFICANCE OF ISOLATING THIS ORGANISM FROM A SINGLE SET OF BLOOD CULTURES WHEN MULTIPLE SETS ARE DRAWN IS UNCERTAIN. PLEASE NOTIFY THE MICROBIOLOGY DEPARTMENT WITHIN ONE WEEK IF SPECIATION AND SENSITIVITIES ARE REQUIRED.     Note: Gram Stain Report Called to,Read Back By and Verified With: Monia Sabal RN @ [phone] 06/03/13 BY KRAWS     Performed at  [facility]   Report Status 06/04/2013 FINAL   Final     Studies: No results found.  Scheduled Meds: . antiseptic oral rinse  15 mL Mouth Rinse q12n4p  . carvedilol  6.25 mg Oral BID  . chlorhexidine  15 mL Mouth Rinse BID  . docusate  100 mg Oral QHS  . furosemide  20 mg Oral BID  . guaifenesin  400 mg Oral Q4H  . insulin aspart  0-9 Units Subcutaneous TID WC  . ipratropium-albuterol  3 mL Nebulization Q6H  . latanoprost  1 drop Both Eyes QHS  . loratadine  10 mg Oral Daily  . pantoprazole  40 mg Oral Daily  . polyethylene glycol  17 g Oral Daily  . potassium chloride  20 mEq Oral Daily  . predniSONE  40 mg Oral Q breakfast  . saccharomyces boulardii  250 mg Oral Daily  . simvastatin  20 mg Oral QHS  . sodium chloride  3 mL Intravenous Q12H  . zolpidem  5 mg Oral Once   Continuous Infusions: . sodium chloride 10 mL/hr at 06/02/13 1800  . sodium chloride Stopped (06/06/13 1600)  . sodium chloride       Charlynne Cousins  [facility] Pager [phone]. If 8PM-8AM, please contact night-coverage at [email or web address], password [facility] 06/09/2013, 7:42 AM  LOS: 10 days

## 2013-06-09 NOTE — Progress Notes (Signed)
Patient up in chair with assistance and use of gait belt.  Can feed self

## 2013-06-09 NOTE — Progress Notes (Signed)
Message left for patient's friend Barnetta Chapel regarding available bed at [facility].  Hopefully will be able to place on Monday 06/11/13 after [facility] is obtained.  Lorie Phenix. [place], [place]

## 2013-06-10 LAB — BLOOD GAS, ARTERIAL
Acid-Base Excess: 17.4 mmol/L — ABNORMAL HIGH (ref 0.0–2.0)
Bicarbonate: 47.6 mEq/L — ABNORMAL HIGH (ref 20.0–24.0)
Drawn by: 34762
FIO2: 1 %
O2 Saturation: 80.4 %
PATIENT TEMPERATURE: 98.6
PH ART: 7.08 — AB (ref 7.350–7.450)
TCO2: 52.7 mmol/L (ref 0–100)
pO2, Arterial: 52.5 mmHg — ABNORMAL LOW (ref 80.0–100.0)

## 2013-06-10 LAB — GLUCOSE, CAPILLARY: Glucose-Capillary: 176 mg/dL — ABNORMAL HIGH (ref 70–99)

## 2013-06-10 MED ORDER — LORAZEPAM 2 MG/ML IJ SOLN: 1.0000 mg | INTRAMUSCULAR | Status: DC | PRN

## 2013-06-10 MED ORDER — IPRATROPIUM-ALBUTEROL 0.5-2.5 (3) MG/3ML IN SOLN
3.0000 mL | Freq: Three times a day (TID) | RESPIRATORY_TRACT | Status: DC
  Filled 2013-06-10: qty 3

## 2013-06-11 NOTE — Discharge Summary (Signed)
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 female with PMH of HTN, HPL, CHF, COPD, muscular dystrophy, chronic respiratory failure on home oxygen presented with SOB, non productive cough, chills for few days and found to have hypoxic in ED, started on BiPAP. 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 Treated with solumedrol and intubated for respiratory failure. Extubated on 1.20.2015 - completed course of levaquin.  - coag neg Staph (1 out 2 bottles) likely contaminant.  - pt develop respiratory failure again on 2013-06-16 midnight. - Was placed on Bipap family was called as she needed intubation. ABG done showed CO > 100. Family decided to move toward comfort care.  Chronic diastolic CHF (echo 02/1750 -- EF 60-65%, PA peak pressure 59mmHg) without exacerbation, pBNP 103:  - s/p diamox and lasix therapy  - Cont coreg, change lasix to orals.   Thrombocytopenia  - mild, stable    Time: 20 minutes  Signed:  Charlynne Cousins  [facility] 06/17/13, 1:58 PM

## 2013-06-17 NOTE — Plan of Care (Signed)
Problem: Phase II Progression Outcomes Goal: ADLs completed with minimal assistance Outcome: Not Met (add Reason) Total Care

## 2013-06-17 NOTE — Progress Notes (Signed)
Pt deceased, pronounced at 9, family at bedside.

## 2013-06-17 NOTE — Significant Event (Addendum)
Rapid Response Event Note  Overview: Time Called: 1937 Arrival Time: 9024 Event Type: Respiratory  Initial Focused Assessment: Called by bedside RN regarding increased WOB and use of accessory muscles after I/O cath. Upon arrival to room, patient will open eyes to voice, will not follow commands, and diaphoretic. CBG WNL, see vitals in doc flowsheet. Patient's O2 sats 91-93% on 3L [place] while mouth breathing. Patient moving only minimum amounts of air. Patient placed on Venti Mask at 50%. Patient suctioned and repositioned in bed  Interventions: NP notified, orders received for ABG. ABG drawn by RT, pH 7.08, CO2 value outside of range, O2 52.5. ABG results given to NP, family updated regarding lab results and patient's decline. Patient currently DNR/DNI. Family states that patient would not want to be placed on BIPAP again and that the family was on the way to the hospital. NP updated regarding family and patient's wishes. Orders received for morphine to help with patient's labored breathing. Will continue to monitor, advised bedside RN to call with further needs.   Event Summary: Name of Physician Notified: Walden Field, NP at [place]    at    Outcome: Stayed in room and stabalized  Event End Time: 0973  Odis Hollingshead

## 2013-06-17 NOTE — Progress Notes (Signed)
Walden Field, NP notified related to patient not voiding, bladder scan completed showing 650mls of urine, order given to perform In & Out cath, cath completed, obtained 600 mls of amber colored urine. While performing cath, patient diaphoretic, tachypnea, breathing labored, CBG-176, B/P 154/77. Rapid response called to assess patient and Walden Field, NP notified about patient change in condition, ABG ordered and Venti-mask placed on patient. Rechecked B/P, see flow sheet, results from ABG obtained and NP called with results, orders given to transfer patient.  POA called to make aware of change in patient's condition and transfer to ICU. Prior to transferring patient, asked POA about restarting Bipap once patient is  transferred to ICU and POA stated not to restart Bipap. Walden Field notified and transfer orders discontinued, patient remains on venti-mask, will continue to monitor.

## 2013-06-17 DEATH — deceased

## 2013-06-30 IMAGING — CR DG CHEST 2V
2 series · 2 of 2 positions shown · non-contrast
Comparison: 11/12/2011

CLINICAL DATA: Cough.  Congestive heart failure.

CHEST - 2 VIEW

[view not recorded (1 of 2)]
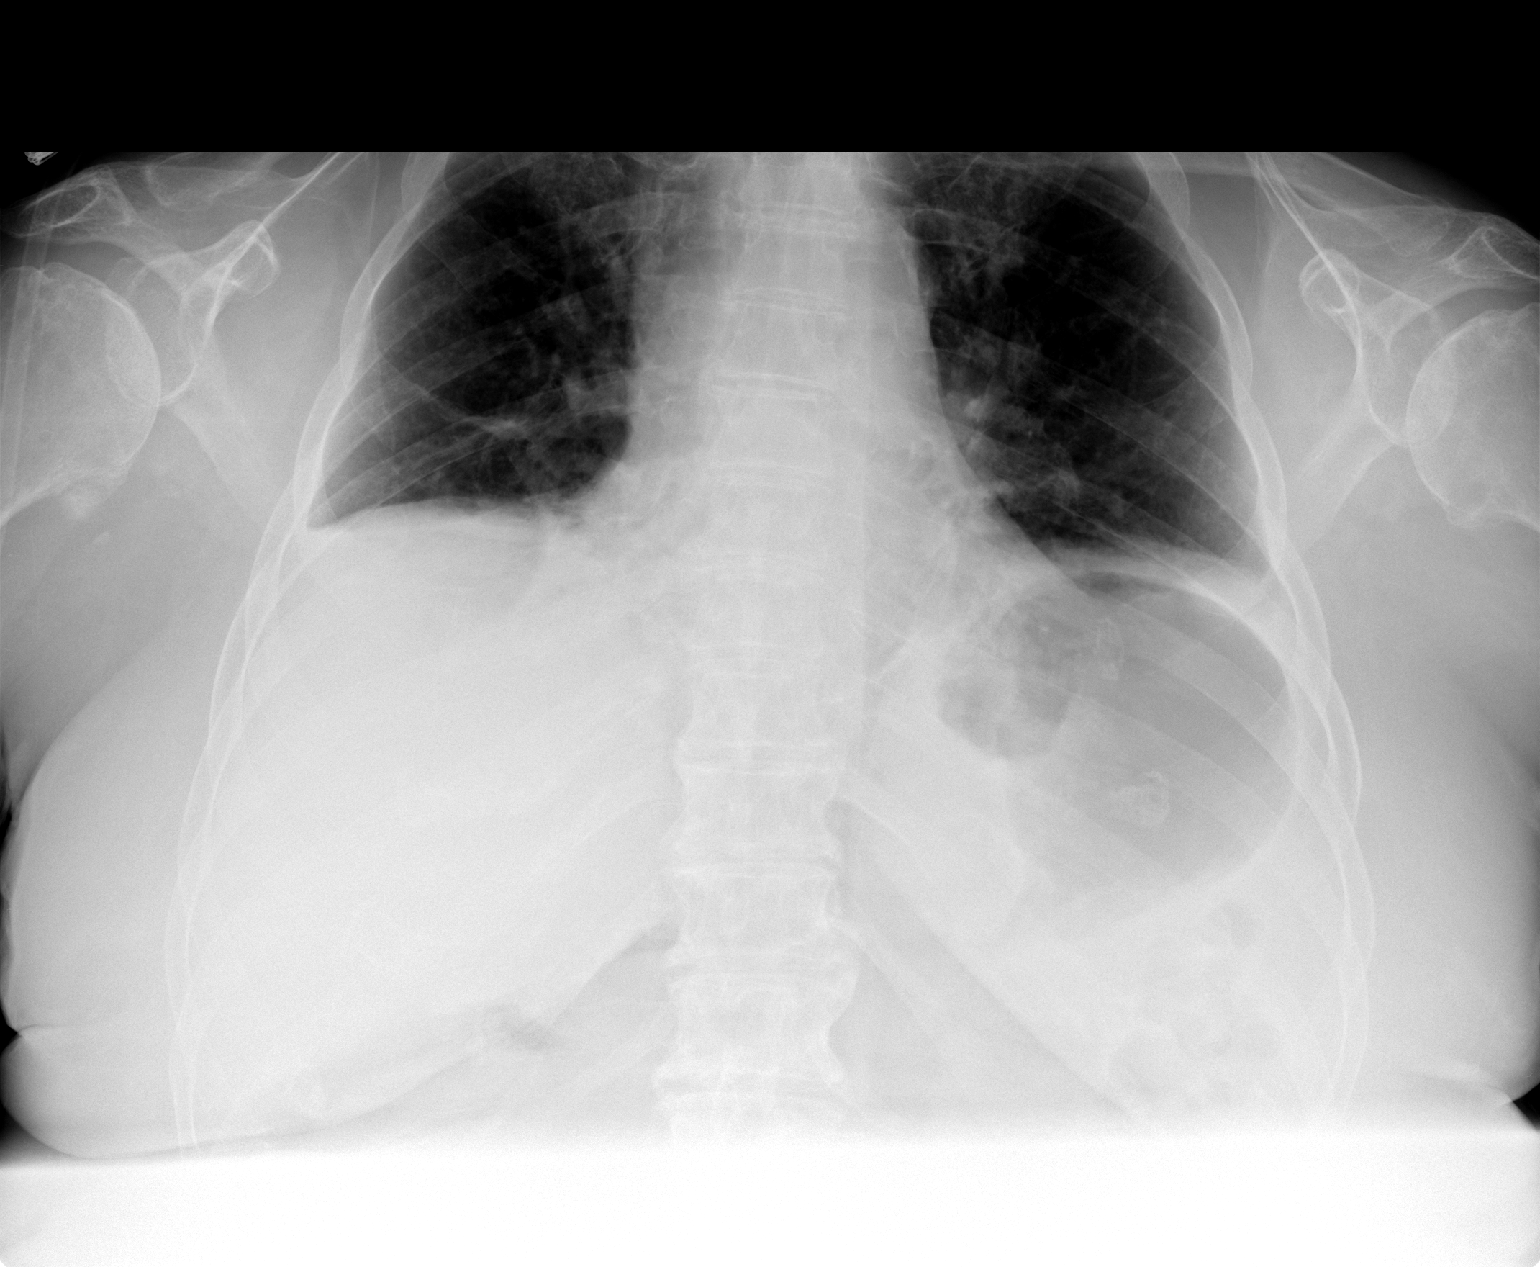

[view not recorded (2 of 2)]
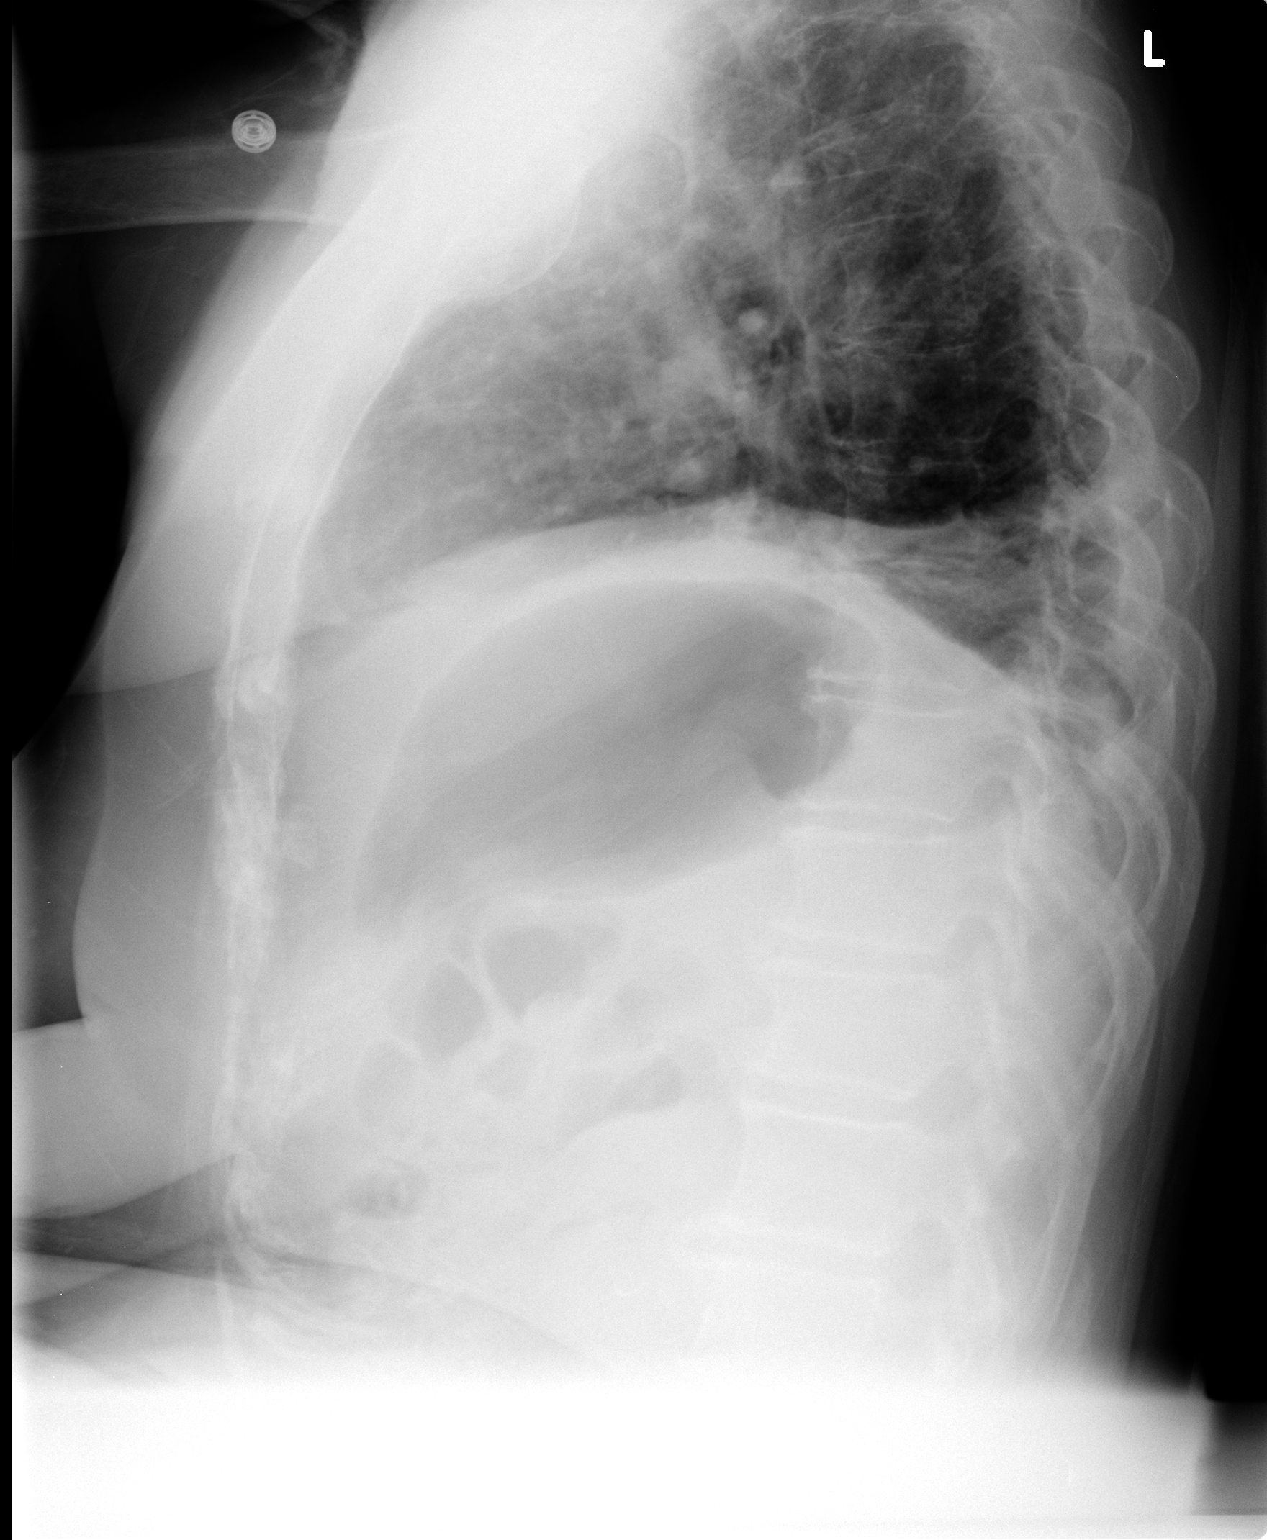

[2 of 2 positions shown; findings below may reference images not displayed]

FINDINGS: The hemidiaphragms are elevated.  There is bibasilar
atelectasis.  The upper lungs are clear.  No apparent effusions.
No free air seen under the diaphragm.
IMPRESSION: Elevated hemidiaphragms.  Basilar volume loss.

## 2013-07-15 DEATH — deceased

## 2015-01-08 IMAGING — CR DG CHEST 1V PORT
1 series · 1 of 1 positions shown · non-contrast
Comparison: Portable exam 8009 hr compared to 03/26/2013

CLINICAL DATA: Cough and congestion for 4 days, history COPD,
asthma, hypertension, CHF

EXAM:
PORTABLE CHEST - 1 VIEW

[portable]
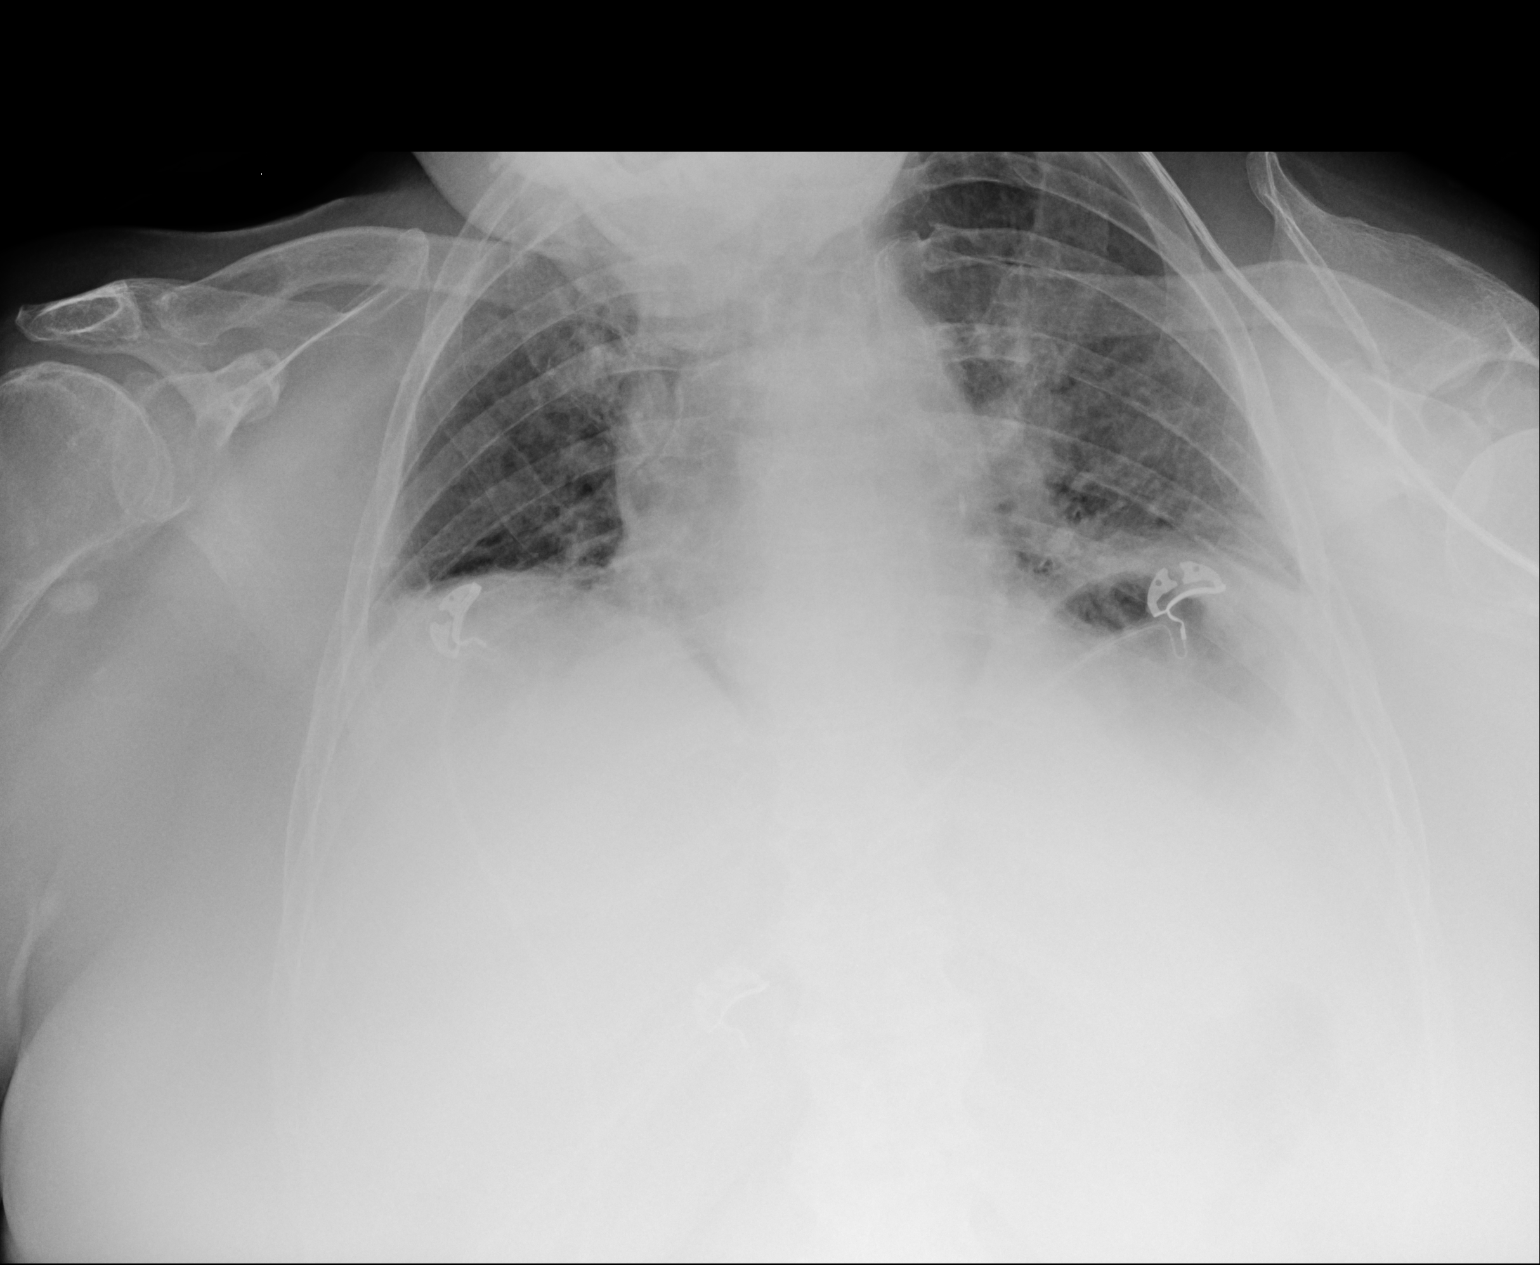

[1 of 1 positions shown; findings below may reference images not displayed]

FINDINGS: Very low lung volumes.

Right apex obscured by patient's chin.

Normal heart size and mediastinal contours.

Bibasilar atelectasis.

No definite acute infiltrate or pleural effusion.

No gross pneumothorax or acute osseous findings.
IMPRESSION: Low lung volumes with bibasilar atelectasis.

## 2015-01-11 IMAGING — CR DG CHEST 1V PORT
1 series · 1 of 1 positions shown · non-contrast
Comparison: Earlier same date.

CLINICAL DATA: Central line, orogastric tube and endotracheal tube
placement.

EXAM:
PORTABLE CHEST - 1 VIEW

[AP]
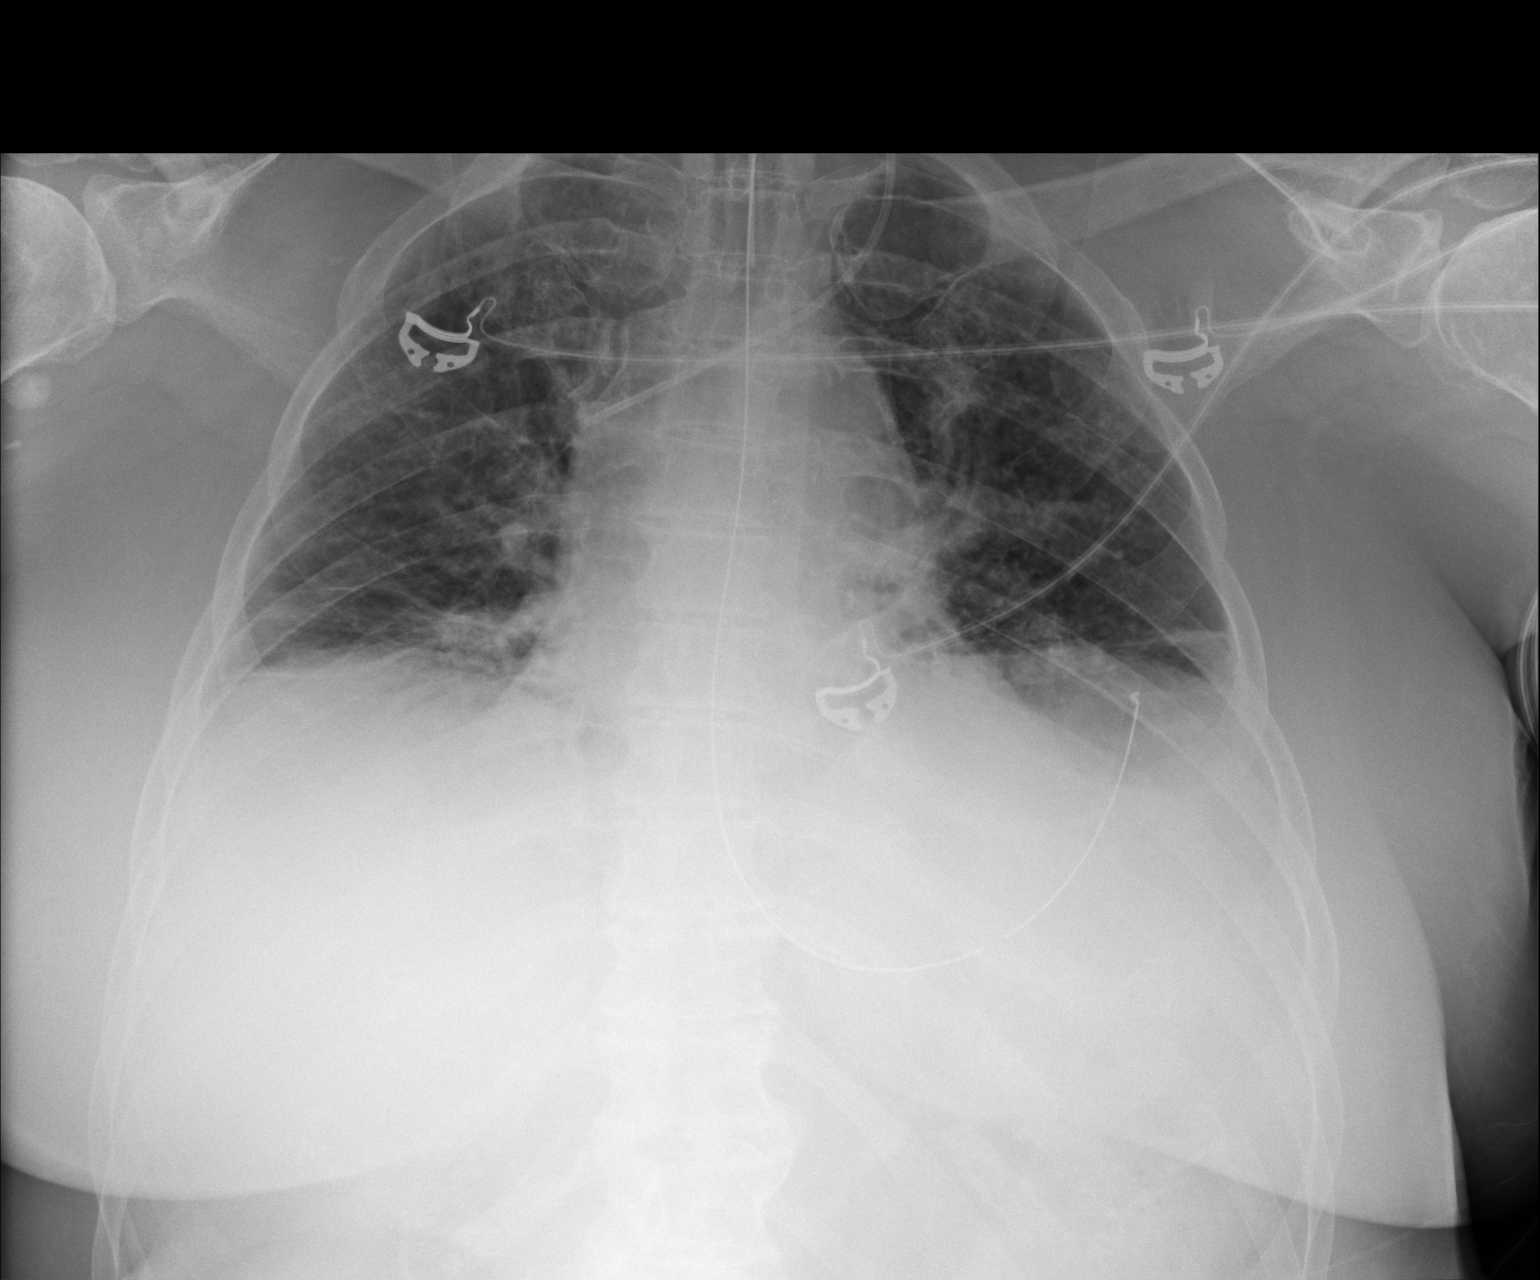

[1 of 1 positions shown; findings below may reference images not displayed]

FINDINGS: 4008 hr. Endotracheal tube appears unchanged within the mid trachea.
An orogastric tube projects below the diaphragm with the tip at the
gastric fundus. There is a left IJ central venous catheter with its
tip overlying the azygos region.

There are persistent low lung volumes with bibasilar atelectasis. No
pneumothorax is demonstrated. The heart size and mediastinal
contours are stable.
IMPRESSION: Central line and orogastric tube placement as described. No
pneumothorax. Stable bibasilar atelectasis.

## 2015-01-13 IMAGING — CR DG CHEST 1V PORT
1 series · 1 of 1 positions shown · non-contrast
Comparison: 06/02/2013

CLINICAL DATA: Bibasilar atelectasis

EXAM:
PORTABLE CHEST - 1 VIEW

[AP]
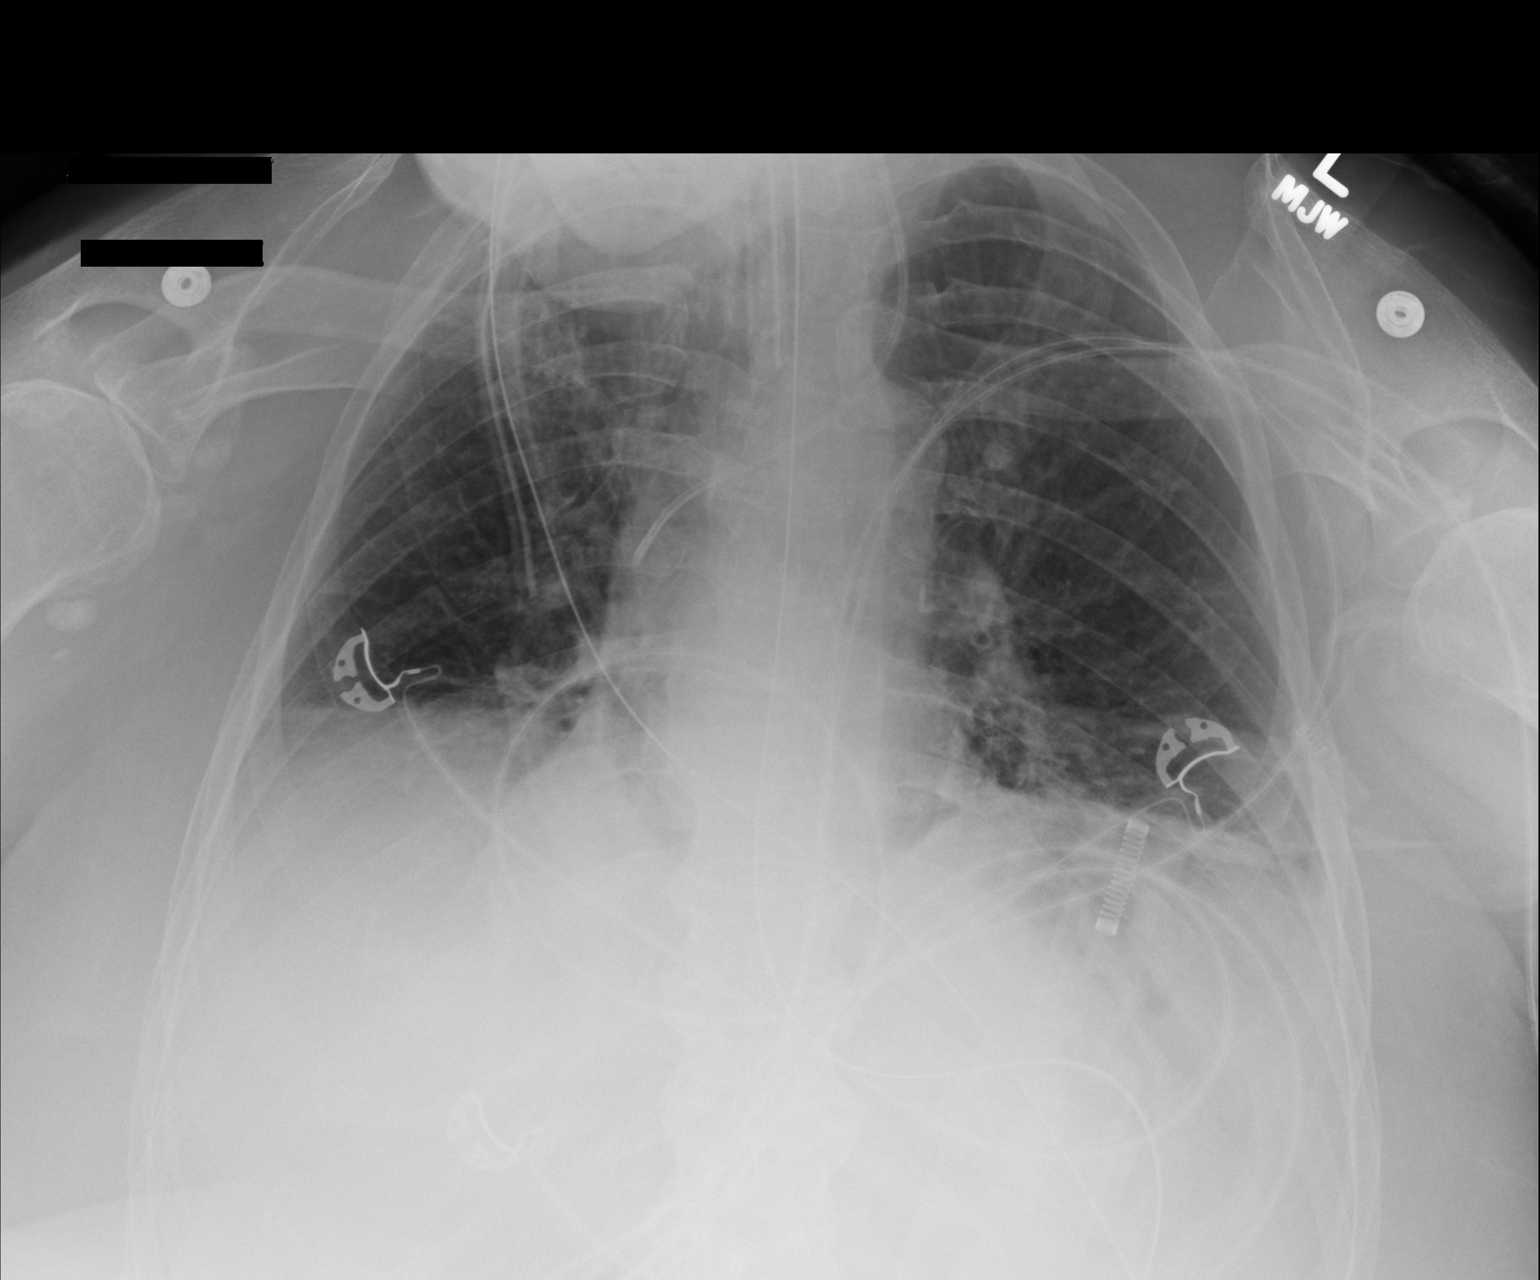

[1 of 1 positions shown; findings below may reference images not displayed]

FINDINGS: Endotracheal tube, NG tube, left internal jugular central venous
catheter are stable. Stable bibasilar pulmonary opacities. Low
volumes. No pneumothorax. Upper normal heart size.
IMPRESSION: Stable bibasilar pulmonary opacities.
